# Patient Record
Sex: Male | Born: 1960 | Race: White | Hispanic: No | Smoking: Never smoker
Health system: Southern US, Community
[De-identification: ages and names within clinical notes are randomized; demographics above are authoritative.]

## PROBLEM LIST (undated history)

## (undated) DIAGNOSIS — C181 Malignant neoplasm of appendix: Secondary | ICD-10-CM

## (undated) DIAGNOSIS — Z86010 Personal history of colonic polyps: Secondary | ICD-10-CM

## (undated) DIAGNOSIS — C4490 Unspecified malignant neoplasm of skin, unspecified: Secondary | ICD-10-CM

## (undated) DIAGNOSIS — Z8639 Personal history of other endocrine, nutritional and metabolic disease: Secondary | ICD-10-CM

## (undated) DIAGNOSIS — R7303 Prediabetes: Secondary | ICD-10-CM

## (undated) DIAGNOSIS — C189 Malignant neoplasm of colon, unspecified: Secondary | ICD-10-CM

## (undated) DIAGNOSIS — H903 Sensorineural hearing loss, bilateral: Secondary | ICD-10-CM

## (undated) DIAGNOSIS — K760 Fatty (change of) liver, not elsewhere classified: Secondary | ICD-10-CM

## (undated) DIAGNOSIS — H905 Unspecified sensorineural hearing loss: Secondary | ICD-10-CM

## (undated) DIAGNOSIS — C799 Secondary malignant neoplasm of unspecified site: Secondary | ICD-10-CM

## (undated) DIAGNOSIS — I2510 Atherosclerotic heart disease of native coronary artery without angina pectoris: Secondary | ICD-10-CM

## (undated) DIAGNOSIS — K635 Polyp of colon: Secondary | ICD-10-CM

## (undated) DIAGNOSIS — E785 Hyperlipidemia, unspecified: Secondary | ICD-10-CM

## (undated) DIAGNOSIS — M1990 Unspecified osteoarthritis, unspecified site: Secondary | ICD-10-CM

## (undated) DIAGNOSIS — E291 Testicular hypofunction: Secondary | ICD-10-CM

## (undated) HISTORY — DX: Personal history of other endocrine, nutritional and metabolic disease: Z86.39

## (undated) HISTORY — PX: COLONOSCOPY: SHX174

## (undated) HISTORY — DX: Personal history of colonic polyps: Z86.010

## (undated) HISTORY — DX: Sensorineural hearing loss, bilateral: H90.3

## (undated) HISTORY — PX: LUNG SURGERY: SHX703

## (undated) HISTORY — DX: Unspecified malignant neoplasm of skin, unspecified: C44.90

## (undated) HISTORY — DX: Polyp of colon: K63.5

## (undated) HISTORY — PX: SMALL INTESTINE SURGERY: SHX150

## (undated) HISTORY — DX: Fatty (change of) liver, not elsewhere classified: K76.0

## (undated) HISTORY — DX: Prediabetes: R73.03

## (undated) HISTORY — DX: Atherosclerotic heart disease of native coronary artery without angina pectoris: I25.10

## (undated) HISTORY — DX: Malignant neoplasm of colon, unspecified: C18.9

## (undated) HISTORY — DX: Hyperlipidemia, unspecified: E78.5

## (undated) HISTORY — PX: BRAIN SURGERY: SHX531

## (undated) HISTORY — DX: Testicular hypofunction: E29.1

## (undated) HISTORY — PX: APPENDECTOMY: SHX54

## (undated) HISTORY — DX: Secondary malignant neoplasm of unspecified site: C79.9

## (undated) HISTORY — DX: Unspecified sensorineural hearing loss: H90.5

## (undated) HISTORY — DX: Malignant neoplasm of appendix: C18.1

---

## 1987-07-22 DIAGNOSIS — C439 Malignant melanoma of skin, unspecified: Secondary | ICD-10-CM

## 1987-07-22 DIAGNOSIS — C799 Secondary malignant neoplasm of unspecified site: Secondary | ICD-10-CM | POA: Diagnosis present

## 1987-07-22 HISTORY — DX: Secondary malignant neoplasm of unspecified site: C79.9

## 1987-07-22 HISTORY — DX: Malignant melanoma of skin, unspecified: C43.9

## 1993-07-21 DIAGNOSIS — C189 Malignant neoplasm of colon, unspecified: Secondary | ICD-10-CM

## 1993-07-21 HISTORY — DX: Malignant neoplasm of colon, unspecified: C18.9

## 1996-07-21 HISTORY — PX: SMALL INTESTINE SURGERY: SHX150

## 1997-11-27 ENCOUNTER — Ambulatory Visit (HOSPITAL_COMMUNITY): Admission: RE | Admit: 1997-11-27 | Discharge: 1997-11-27 | Payer: Self-pay | Admitting: Neurosurgery

## 1998-12-03 ENCOUNTER — Encounter: Payer: Self-pay | Admitting: Neurosurgery

## 1998-12-03 ENCOUNTER — Ambulatory Visit (HOSPITAL_COMMUNITY): Admission: RE | Admit: 1998-12-03 | Discharge: 1998-12-03 | Payer: Self-pay | Admitting: Neurosurgery

## 1999-08-23 ENCOUNTER — Encounter: Payer: Self-pay | Admitting: *Deleted

## 1999-08-23 ENCOUNTER — Encounter: Admission: RE | Admit: 1999-08-23 | Discharge: 1999-08-23 | Payer: Self-pay | Admitting: *Deleted

## 1999-11-22 ENCOUNTER — Encounter: Payer: Self-pay | Admitting: *Deleted

## 1999-11-22 ENCOUNTER — Ambulatory Visit (HOSPITAL_COMMUNITY): Admission: RE | Admit: 1999-11-22 | Discharge: 1999-11-22 | Payer: Self-pay | Admitting: *Deleted

## 2000-02-28 ENCOUNTER — Encounter: Payer: Self-pay | Admitting: Oncology

## 2000-02-28 ENCOUNTER — Encounter: Admission: RE | Admit: 2000-02-28 | Discharge: 2000-02-28 | Payer: Self-pay | Admitting: Oncology

## 2000-03-06 ENCOUNTER — Encounter: Payer: Self-pay | Admitting: Oncology

## 2000-03-06 ENCOUNTER — Encounter: Admission: RE | Admit: 2000-03-06 | Discharge: 2000-03-06 | Payer: Self-pay | Admitting: Oncology

## 2000-09-18 ENCOUNTER — Encounter: Admission: RE | Admit: 2000-09-18 | Discharge: 2000-09-18 | Payer: Self-pay | Admitting: Oncology

## 2000-09-18 ENCOUNTER — Encounter: Payer: Self-pay | Admitting: Oncology

## 2001-02-18 ENCOUNTER — Encounter: Payer: Self-pay | Admitting: Oncology

## 2001-02-18 ENCOUNTER — Encounter: Admission: RE | Admit: 2001-02-18 | Discharge: 2001-02-18 | Payer: Self-pay | Admitting: Oncology

## 2001-07-23 ENCOUNTER — Encounter: Admission: RE | Admit: 2001-07-23 | Discharge: 2001-07-23 | Payer: Self-pay | Admitting: *Deleted

## 2001-07-23 ENCOUNTER — Ambulatory Visit (HOSPITAL_COMMUNITY): Admission: RE | Admit: 2001-07-23 | Discharge: 2001-07-23 | Payer: Self-pay | Admitting: *Deleted

## 2001-07-23 ENCOUNTER — Encounter: Payer: Self-pay | Admitting: *Deleted

## 2002-03-25 ENCOUNTER — Encounter: Admission: RE | Admit: 2002-03-25 | Discharge: 2002-03-25 | Payer: Self-pay | Admitting: Oncology

## 2002-03-25 ENCOUNTER — Encounter: Payer: Self-pay | Admitting: Oncology

## 2002-07-29 ENCOUNTER — Ambulatory Visit (HOSPITAL_COMMUNITY): Admission: RE | Admit: 2002-07-29 | Discharge: 2002-07-29 | Payer: Self-pay | Admitting: Oncology

## 2002-07-29 ENCOUNTER — Encounter: Payer: Self-pay | Admitting: Oncology

## 2003-01-27 ENCOUNTER — Encounter: Admission: RE | Admit: 2003-01-27 | Discharge: 2003-01-27 | Payer: Self-pay | Admitting: Oncology

## 2003-01-27 ENCOUNTER — Encounter: Payer: Self-pay | Admitting: Oncology

## 2003-08-04 ENCOUNTER — Ambulatory Visit (HOSPITAL_COMMUNITY): Admission: RE | Admit: 2003-08-04 | Discharge: 2003-08-04 | Payer: Self-pay | Admitting: Oncology

## 2003-09-04 ENCOUNTER — Ambulatory Visit (HOSPITAL_COMMUNITY): Admission: RE | Admit: 2003-09-04 | Discharge: 2003-09-04 | Payer: Self-pay | Admitting: Oncology

## 2004-02-09 ENCOUNTER — Ambulatory Visit (HOSPITAL_COMMUNITY): Admission: RE | Admit: 2004-02-09 | Discharge: 2004-02-09 | Payer: Self-pay | Admitting: Oncology

## 2004-08-23 ENCOUNTER — Encounter: Admission: RE | Admit: 2004-08-23 | Discharge: 2004-08-23 | Payer: Self-pay | Admitting: Oncology

## 2004-08-26 ENCOUNTER — Ambulatory Visit: Payer: Self-pay | Admitting: Oncology

## 2005-08-22 ENCOUNTER — Ambulatory Visit (HOSPITAL_COMMUNITY): Admission: RE | Admit: 2005-08-22 | Discharge: 2005-08-22 | Payer: Self-pay | Admitting: Oncology

## 2005-09-11 ENCOUNTER — Ambulatory Visit: Payer: Self-pay | Admitting: Oncology

## 2006-09-04 ENCOUNTER — Ambulatory Visit (HOSPITAL_COMMUNITY): Admission: RE | Admit: 2006-09-04 | Discharge: 2006-09-04 | Payer: Self-pay | Admitting: Oncology

## 2006-09-07 ENCOUNTER — Ambulatory Visit: Payer: Self-pay | Admitting: Oncology

## 2007-08-31 ENCOUNTER — Ambulatory Visit: Payer: Self-pay | Admitting: Oncology

## 2007-09-02 LAB — CBC WITH DIFFERENTIAL/PLATELET
BASO%: 0.5 % (ref 0.0–2.0)
Basophils Absolute: 0 10*3/uL (ref 0.0–0.1)
EOS%: 0.5 % (ref 0.0–7.0)
Eosinophils Absolute: 0 10*3/uL (ref 0.0–0.5)
HCT: 44.1 % (ref 38.7–49.9)
HGB: 14.9 g/dL (ref 13.0–17.1)
LYMPH%: 21.1 % (ref 14.0–48.0)
MCH: 29 pg (ref 28.0–33.4)
MCHC: 33.7 g/dL (ref 32.0–35.9)
MCV: 86.2 fL (ref 81.6–98.0)
MONO#: 0.5 10*3/uL (ref 0.1–0.9)
MONO%: 7 % (ref 0.0–13.0)
NEUT#: 5.4 10*3/uL (ref 1.5–6.5)
NEUT%: 70.9 % (ref 40.0–75.0)
Platelets: 309 10*3/uL (ref 145–400)
RBC: 5.12 10*6/uL (ref 4.20–5.71)
RDW: 12.5 % (ref 11.2–14.6)
WBC: 7.6 10*3/uL (ref 4.0–10.0)
lymph#: 1.6 10*3/uL (ref 0.9–3.3)

## 2007-09-02 LAB — COMPREHENSIVE METABOLIC PANEL
ALT: 19 U/L (ref 0–53)
AST: 17 U/L (ref 0–37)
Albumin: 4.6 g/dL (ref 3.5–5.2)
Alkaline Phosphatase: 46 U/L (ref 39–117)
BUN: 16 mg/dL (ref 6–23)
CO2: 27 mEq/L (ref 19–32)
Calcium: 9.2 mg/dL (ref 8.4–10.5)
Chloride: 104 mEq/L (ref 96–112)
Creatinine, Ser: 1.01 mg/dL (ref 0.40–1.50)
Glucose, Bld: 100 mg/dL — ABNORMAL HIGH (ref 70–99)
Potassium: 4 mEq/L (ref 3.5–5.3)
Sodium: 139 mEq/L (ref 135–145)
Total Bilirubin: 0.5 mg/dL (ref 0.3–1.2)
Total Protein: 7 g/dL (ref 6.0–8.3)

## 2007-11-11 ENCOUNTER — Encounter: Admission: RE | Admit: 2007-11-11 | Discharge: 2007-11-11 | Payer: Self-pay | Admitting: Gastroenterology

## 2008-09-06 ENCOUNTER — Ambulatory Visit: Payer: Self-pay | Admitting: Oncology

## 2008-09-06 LAB — COMPREHENSIVE METABOLIC PANEL
ALT: 37 U/L (ref 0–53)
AST: 25 U/L (ref 0–37)
Albumin: 4.1 g/dL (ref 3.5–5.2)
Alkaline Phosphatase: 43 U/L (ref 39–117)
BUN: 13 mg/dL (ref 6–23)
CO2: 30 mEq/L (ref 19–32)
Calcium: 9.2 mg/dL (ref 8.4–10.5)
Chloride: 101 mEq/L (ref 96–112)
Creatinine, Ser: 0.85 mg/dL (ref 0.40–1.50)
Glucose, Bld: 104 mg/dL — ABNORMAL HIGH (ref 70–99)
Potassium: 3.5 mEq/L (ref 3.5–5.3)
Sodium: 138 mEq/L (ref 135–145)
Total Bilirubin: 0.5 mg/dL (ref 0.3–1.2)
Total Protein: 7.1 g/dL (ref 6.0–8.3)

## 2008-09-06 LAB — CBC WITH DIFFERENTIAL/PLATELET
BASO%: 0.3 % (ref 0.0–2.0)
Basophils Absolute: 0 10*3/uL (ref 0.0–0.1)
EOS%: 0.8 % (ref 0.0–7.0)
Eosinophils Absolute: 0.1 10*3/uL (ref 0.0–0.5)
HCT: 44.3 % (ref 38.7–49.9)
HGB: 15.3 g/dL (ref 13.0–17.1)
LYMPH%: 21.4 % (ref 14.0–48.0)
MCH: 30.3 pg (ref 28.0–33.4)
MCHC: 34.6 g/dL (ref 32.0–35.9)
MCV: 87.7 fL (ref 81.6–98.0)
MONO#: 0.5 10*3/uL (ref 0.1–0.9)
MONO%: 7 % (ref 0.0–13.0)
NEUT#: 5.1 10*3/uL (ref 1.5–6.5)
NEUT%: 70.5 % (ref 40.0–75.0)
Platelets: 242 10*3/uL (ref 145–400)
RBC: 5.05 10*6/uL (ref 4.20–5.71)
RDW: 12.4 % (ref 11.2–14.6)
WBC: 7.2 10*3/uL (ref 4.0–10.0)
lymph#: 1.5 10*3/uL (ref 0.9–3.3)

## 2008-09-18 ENCOUNTER — Ambulatory Visit (HOSPITAL_COMMUNITY): Admission: RE | Admit: 2008-09-18 | Discharge: 2008-09-18 | Payer: Self-pay | Admitting: Oncology

## 2009-09-03 ENCOUNTER — Ambulatory Visit: Payer: Self-pay | Admitting: Oncology

## 2009-09-06 LAB — CBC WITH DIFFERENTIAL/PLATELET
BASO%: 0.3 % (ref 0.0–2.0)
Basophils Absolute: 0 10*3/uL (ref 0.0–0.1)
EOS%: 1.1 % (ref 0.0–7.0)
Eosinophils Absolute: 0.1 10*3/uL (ref 0.0–0.5)
HCT: 42.2 % (ref 38.4–49.9)
HGB: 14.6 g/dL (ref 13.0–17.1)
LYMPH%: 22 % (ref 14.0–49.0)
MCH: 30.8 pg (ref 27.2–33.4)
MCHC: 34.6 g/dL (ref 32.0–36.0)
MCV: 88.9 fL (ref 79.3–98.0)
MONO#: 0.4 10*3/uL (ref 0.1–0.9)
MONO%: 5.2 % (ref 0.0–14.0)
NEUT#: 5.6 10*3/uL (ref 1.5–6.5)
NEUT%: 71.4 % (ref 39.0–75.0)
Platelets: 217 10*3/uL (ref 140–400)
RBC: 4.74 10*6/uL (ref 4.20–5.82)
RDW: 12.2 % (ref 11.0–14.6)
WBC: 7.9 10*3/uL (ref 4.0–10.3)
lymph#: 1.7 10*3/uL (ref 0.9–3.3)

## 2009-09-06 LAB — COMPREHENSIVE METABOLIC PANEL
ALT: 41 U/L (ref 0–53)
AST: 31 U/L (ref 0–37)
Albumin: 4.2 g/dL (ref 3.5–5.2)
Alkaline Phosphatase: 44 U/L (ref 39–117)
BUN: 14 mg/dL (ref 6–23)
CO2: 29 mEq/L (ref 19–32)
Calcium: 9.3 mg/dL (ref 8.4–10.5)
Chloride: 103 mEq/L (ref 96–112)
Creatinine, Ser: 1.03 mg/dL (ref 0.40–1.50)
Glucose, Bld: 98 mg/dL (ref 70–99)
Potassium: 3.7 mEq/L (ref 3.5–5.3)
Sodium: 140 mEq/L (ref 135–145)
Total Bilirubin: 0.7 mg/dL (ref 0.3–1.2)
Total Protein: 7.3 g/dL (ref 6.0–8.3)

## 2009-09-06 LAB — PSA: PSA: 0.61 ng/mL (ref 0.10–4.00)

## 2010-09-02 ENCOUNTER — Other Ambulatory Visit: Payer: Self-pay | Admitting: Oncology

## 2010-09-02 ENCOUNTER — Encounter (HOSPITAL_BASED_OUTPATIENT_CLINIC_OR_DEPARTMENT_OTHER): Payer: BC Managed Care – PPO | Admitting: Oncology

## 2010-09-02 DIAGNOSIS — C439 Malignant melanoma of skin, unspecified: Secondary | ICD-10-CM

## 2010-09-02 LAB — PSA: PSA: 0.74 ng/mL (ref <=4.00)

## 2010-09-02 LAB — CBC WITH DIFFERENTIAL/PLATELET
BASO%: 0.8 % (ref 0.0–2.0)
Basophils Absolute: 0.1 10*3/uL (ref 0.0–0.1)
EOS%: 0.9 % (ref 0.0–7.0)
Eosinophils Absolute: 0.1 10*3/uL (ref 0.0–0.5)
HCT: 42.9 % (ref 38.4–49.9)
HGB: 14.7 g/dL (ref 13.0–17.1)
LYMPH%: 30.8 % (ref 14.0–49.0)
MCH: 30 pg (ref 27.2–33.4)
MCHC: 34.2 g/dL (ref 32.0–36.0)
MCV: 87.6 fL (ref 79.3–98.0)
MONO#: 0.4 10*3/uL (ref 0.1–0.9)
MONO%: 6.2 % (ref 0.0–14.0)
NEUT#: 3.8 10*3/uL (ref 1.5–6.5)
NEUT%: 61.3 % (ref 39.0–75.0)
Platelets: 226 10*3/uL (ref 140–400)
RBC: 4.9 10*6/uL (ref 4.20–5.82)
RDW: 12.8 % (ref 11.0–14.6)
WBC: 6.2 10*3/uL (ref 4.0–10.3)
lymph#: 1.9 10*3/uL (ref 0.9–3.3)

## 2010-09-02 LAB — COMPREHENSIVE METABOLIC PANEL
ALT: 23 U/L (ref 0–53)
AST: 20 U/L (ref 0–37)
Albumin: 3.9 g/dL (ref 3.5–5.2)
Alkaline Phosphatase: 44 U/L (ref 39–117)
BUN: 11 mg/dL (ref 6–23)
CO2: 29 mEq/L (ref 19–32)
Calcium: 9.2 mg/dL (ref 8.4–10.5)
Chloride: 105 mEq/L (ref 96–112)
Creatinine, Ser: 0.96 mg/dL (ref 0.40–1.50)
Glucose, Bld: 118 mg/dL — ABNORMAL HIGH (ref 70–99)
Potassium: 3.5 mEq/L (ref 3.5–5.3)
Sodium: 141 mEq/L (ref 135–145)
Total Bilirubin: 0.4 mg/dL (ref 0.3–1.2)
Total Protein: 6.9 g/dL (ref 6.0–8.3)

## 2010-09-09 ENCOUNTER — Encounter (HOSPITAL_BASED_OUTPATIENT_CLINIC_OR_DEPARTMENT_OTHER): Payer: BC Managed Care – PPO | Admitting: Oncology

## 2010-09-09 DIAGNOSIS — C4360 Malignant melanoma of unspecified upper limb, including shoulder: Secondary | ICD-10-CM

## 2011-06-02 DIAGNOSIS — E785 Hyperlipidemia, unspecified: Secondary | ICD-10-CM | POA: Insufficient documentation

## 2011-09-03 ENCOUNTER — Other Ambulatory Visit: Payer: BC Managed Care – PPO | Admitting: Lab

## 2011-09-08 ENCOUNTER — Other Ambulatory Visit (HOSPITAL_BASED_OUTPATIENT_CLINIC_OR_DEPARTMENT_OTHER): Payer: BC Managed Care – PPO

## 2011-09-08 DIAGNOSIS — C7800 Secondary malignant neoplasm of unspecified lung: Secondary | ICD-10-CM

## 2011-09-08 DIAGNOSIS — C792 Secondary malignant neoplasm of skin: Secondary | ICD-10-CM

## 2011-09-08 DIAGNOSIS — C4360 Malignant melanoma of unspecified upper limb, including shoulder: Secondary | ICD-10-CM

## 2011-09-08 DIAGNOSIS — C801 Malignant (primary) neoplasm, unspecified: Secondary | ICD-10-CM

## 2011-09-08 LAB — COMPREHENSIVE METABOLIC PANEL
ALT: 33 U/L (ref 0–53)
AST: 21 U/L (ref 0–37)
Albumin: 3.7 g/dL (ref 3.5–5.2)
Alkaline Phosphatase: 51 U/L (ref 39–117)
BUN: 15 mg/dL (ref 6–23)
CO2: 27 mEq/L (ref 19–32)
Calcium: 9.2 mg/dL (ref 8.4–10.5)
Chloride: 104 mEq/L (ref 96–112)
Creatinine, Ser: 0.91 mg/dL (ref 0.50–1.35)
Glucose, Bld: 101 mg/dL — ABNORMAL HIGH (ref 70–99)
Potassium: 3.5 mEq/L (ref 3.5–5.3)
Sodium: 141 mEq/L (ref 135–145)
Total Bilirubin: 0.3 mg/dL (ref 0.3–1.2)
Total Protein: 7.4 g/dL (ref 6.0–8.3)

## 2011-09-08 LAB — CBC WITH DIFFERENTIAL/PLATELET
BASO%: 0.3 % (ref 0.0–2.0)
Basophils Absolute: 0 10*3/uL (ref 0.0–0.1)
EOS%: 1 % (ref 0.0–7.0)
Eosinophils Absolute: 0.1 10*3/uL (ref 0.0–0.5)
HCT: 42 % (ref 38.4–49.9)
HGB: 14.4 g/dL (ref 13.0–17.1)
LYMPH%: 24.8 % (ref 14.0–49.0)
MCH: 29.8 pg (ref 27.2–33.4)
MCHC: 34.4 g/dL (ref 32.0–36.0)
MCV: 86.7 fL (ref 79.3–98.0)
MONO#: 0.5 10*3/uL (ref 0.1–0.9)
MONO%: 6.6 % (ref 0.0–14.0)
NEUT#: 4.7 10*3/uL (ref 1.5–6.5)
NEUT%: 67.3 % (ref 39.0–75.0)
Platelets: 217 10*3/uL (ref 140–400)
RBC: 4.84 10*6/uL (ref 4.20–5.82)
RDW: 12.7 % (ref 11.0–14.6)
WBC: 7 10*3/uL (ref 4.0–10.3)
lymph#: 1.7 10*3/uL (ref 0.9–3.3)

## 2011-09-08 LAB — PSA: PSA: 0.67 ng/mL (ref <=4.00)

## 2011-09-10 ENCOUNTER — Ambulatory Visit (HOSPITAL_BASED_OUTPATIENT_CLINIC_OR_DEPARTMENT_OTHER): Payer: BC Managed Care – PPO | Admitting: Oncology

## 2011-09-10 VITALS — BP 138/85 | HR 61 | Temp 98.3°F | Ht 68.0 in | Wt 212.9 lb

## 2011-09-10 DIAGNOSIS — C439 Malignant melanoma of skin, unspecified: Secondary | ICD-10-CM | POA: Insufficient documentation

## 2011-09-10 NOTE — Progress Notes (Signed)
ID: Sean [name]   DOB: 24-May-1961  MR#: [id number]  CSN#:[id number]   INTERVAL HISTORY: Sean [name] returns today with his 50 year old son Sean [name] (sixth grade, likes science) for followup of Sean [name]'s remote metastatic melanoma. 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 Unfortunately this means a lot of riding but not much walking. He is no longer hunting.  REVIEW OF SYSTEMS: He denies unusual headaches, visual changes, nausea, vomiting, taste alteration, cough, phlegm production, pleurisy, shortness of breath, or any change in bladder habits. There has been no bleeding, unexplained fatigue or unexplained weight loss. He has not noted any skin changes and he continues to see Dr. [name] on a regular basis. He has had a few loose bowel movements lately, he thinks possibly because of some diet changes. Overall however a detailed review of systems today was noncontributory  HEALTH MAINTENANCE: History  Substance Use Topics  . Smoking status: Not on file  . Smokeless tobacco: Not on file  . Alcohol Use: Not on file     Colonoscopy: 2010  Lipid panel: "finally chol. <200" (on red yeast rice)  Allergies not on file  No current outpatient prescriptions on file.    OBJECTIVE: Middle-aged white male who appears comfortable Filed Vitals:   09/10/11 1700  BP: 138/85  Pulse: 61  Temp: 98.3 F (36.8 C)     Body mass index is 32.37 kg/(m^2).    ECOG FS: 0  Sclerae unicteric Oropharynx clear No peripheral adenopathy Lungs no rales or rhonchi Heart regular rate and rhythm Abd benign MSK no focal spinal tenderness, no peripheral edema Neuro: nonfocal Skin: He has scattered acne but no pigmented or suspicious lesions. Only the upper body was examined today  LAB RESULTS:  PSA is <1  Lab Results  Component Value Date   WBC 7.0 09/08/2011   NEUTROABS 4.7 09/08/2011   HGB 14.4 09/08/2011   HCT 42.0 09/08/2011   MCV 86.7 09/08/2011   PLT 217 09/08/2011     Chemistry      Component Value Date/Time   NA 141 09/08/2011 1624   K 3.5 09/08/2011 1624   CL 104 09/08/2011 1624   CO2 27 09/08/2011 1624   BUN 15 09/08/2011 1624   CREATININE 0.91 09/08/2011 1624      Component Value Date/Time   CALCIUM 9.2 09/08/2011 1624   ALKPHOS 51 09/08/2011 1624   AST 21 09/08/2011 1624   ALT 33 09/08/2011 1624   BILITOT 0.3 09/08/2011 1624       STUDIES: No new results found.  ASSESSMENT: 51 year old [place] man with a history of malignant melanoma dating to November 1989 when he had a Clark's level III, Breslow depth 1.0 left forearm melanoma removed.  It recurred to the left upper arm in February 1993, surgically removed.  He then had further recurrences to the left back, left lower lobe of the lung (removed in 1995), solitary brain lesion removed in September 1996, and at that point he received the [facility] regimen chemotherapy with a right lower lobe lung lesion as measurable disease. The chemotherapy was ineffective and theright lung lesion was removed in November 1996.  In February 1997 he had a small bowel lesion removed followed by a mesenteric lesion removed in 1998.  PLAN: There have been no further recurrences. His exam and labs today are unremarkable. He was supposed to have had a chest x-ray prior to today's visit but he will arrange for that within the next 2 weeks.  He will see me again in one year with a repeat chest x-ray before that visit as well as lab work. I encouraged him to start a walking program and if it's difficult to do it during the day to consider getting a treadmill for the home gym. He knows to call for any problems that may develop before the next visit here.   Sean [name] C    09/10/2011

## 2011-09-11 ENCOUNTER — Telehealth: Payer: Self-pay | Admitting: Oncology

## 2011-09-11 NOTE — Telephone Encounter (Signed)
S/w the pt regarding his 2014 appts

## 2012-09-06 ENCOUNTER — Other Ambulatory Visit: Payer: BC Managed Care – PPO

## 2012-09-07 ENCOUNTER — Other Ambulatory Visit: Payer: BC Managed Care – PPO

## 2012-09-14 ENCOUNTER — Ambulatory Visit: Payer: BC Managed Care – PPO | Admitting: Oncology

## 2012-09-16 ENCOUNTER — Ambulatory Visit: Payer: BC Managed Care – PPO | Admitting: Oncology

## 2012-10-06 ENCOUNTER — Other Ambulatory Visit: Payer: Self-pay | Admitting: Oncology

## 2012-10-06 ENCOUNTER — Other Ambulatory Visit (HOSPITAL_BASED_OUTPATIENT_CLINIC_OR_DEPARTMENT_OTHER): Payer: BC Managed Care – PPO

## 2012-10-06 DIAGNOSIS — C439 Malignant melanoma of skin, unspecified: Secondary | ICD-10-CM

## 2012-10-06 LAB — COMPREHENSIVE METABOLIC PANEL (CC13)
ALT: 28 U/L (ref 0–55)
AST: 17 U/L (ref 5–34)
Albumin: 3.6 g/dL (ref 3.5–5.0)
Alkaline Phosphatase: 51 U/L (ref 40–150)
BUN: 16.3 mg/dL (ref 7.0–26.0)
CO2: 28 mEq/L (ref 22–29)
Calcium: 9.1 mg/dL (ref 8.4–10.4)
Chloride: 104 mEq/L (ref 98–107)
Creatinine: 0.8 mg/dL (ref 0.7–1.3)
Glucose: 117 mg/dl — ABNORMAL HIGH (ref 70–99)
Potassium: 3.5 mEq/L (ref 3.5–5.1)
Sodium: 141 mEq/L (ref 136–145)
Total Bilirubin: 0.45 mg/dL (ref 0.20–1.20)
Total Protein: 7 g/dL (ref 6.4–8.3)

## 2012-10-06 LAB — CBC WITH DIFFERENTIAL/PLATELET
BASO%: 0.7 % (ref 0.0–2.0)
Basophils Absolute: 0.1 10*3/uL (ref 0.0–0.1)
EOS%: 1.2 % (ref 0.0–7.0)
Eosinophils Absolute: 0.1 10*3/uL (ref 0.0–0.5)
HCT: 42.3 % (ref 38.4–49.9)
HGB: 14.3 g/dL (ref 13.0–17.1)
LYMPH%: 26.6 % (ref 14.0–49.0)
MCH: 28.8 pg (ref 27.2–33.4)
MCHC: 33.8 g/dL (ref 32.0–36.0)
MCV: 85.3 fL (ref 79.3–98.0)
MONO#: 0.4 10*3/uL (ref 0.1–0.9)
MONO%: 5.6 % (ref 0.0–14.0)
NEUT#: 5.1 10*3/uL (ref 1.5–6.5)
NEUT%: 65.9 % (ref 39.0–75.0)
Platelets: 238 10*3/uL (ref 140–400)
RBC: 4.96 10*6/uL (ref 4.20–5.82)
RDW: 12.9 % (ref 11.0–14.6)
WBC: 7.7 10*3/uL (ref 4.0–10.3)
lymph#: 2.1 10*3/uL (ref 0.9–3.3)

## 2012-10-06 LAB — PSA: PSA: 0.72 ng/mL (ref <=4.00)

## 2012-10-18 ENCOUNTER — Ambulatory Visit (HOSPITAL_BASED_OUTPATIENT_CLINIC_OR_DEPARTMENT_OTHER): Payer: BC Managed Care – PPO | Admitting: Oncology

## 2012-10-18 ENCOUNTER — Telehealth: Payer: Self-pay | Admitting: *Deleted

## 2012-10-18 VITALS — BP 119/70 | HR 50 | Temp 97.8°F | Resp 20 | Ht 68.0 in | Wt 209.7 lb

## 2012-10-18 DIAGNOSIS — M25569 Pain in unspecified knee: Secondary | ICD-10-CM

## 2012-10-18 DIAGNOSIS — C439 Malignant melanoma of skin, unspecified: Secondary | ICD-10-CM

## 2012-10-18 NOTE — Progress Notes (Signed)
ID: Sean [name]   DOB: 05/19/1961  MR#: [id number]  CSN#:[id number]  PCP: Sean Lek, MD OTHER MD: Sean [name]  INTERVAL HISTORY: Sean [name] returns today with his sons Sean [name] and Sean [name] for followup of Sean [name]'s  metastatic melanoma. He continues to work for [employer] [name]; and has about 9 more years before you can retire, he tells me. His primary care doctor retired and he is now seeing Sean [name]  REVIEW OF SYSTEMS: His biggest problem is the right knee. He went saw Sean [name], had an MRI, and basically was told to get a new job. Of course Sean [name] can to do that and he is hoping for a second opinion. He's had minimal ear drainage, no sinus symptoms, had a separate current doses shaved off his left sideburn, but has had no unusual headaches visual changes, nausea, vomiting, or balance problems. He denies cough, shortness of breath, pleurisy, or change in bowel or bladder habits. A detailed review of systems today was otherwise negative.  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 His son Sean [name] works for [employer] and is in [employer] reserves, eventually to be in an Barrister's clerk. Son Sean [name] is in the seventh grade.  HEALTH MAINTENANCE: History  Substance Use Topics  . Smoking status: Not on file  . Smokeless tobacco: Not on file  . Alcohol Use: Not on file     Colonoscopy: 2010  Lipid panel: on red yeast rice  Allergies not on file  No current outpatient prescriptions on file.   No current facility-administered medications for this visit.    OBJECTIVE: Middle-aged white male in no acute distress Filed Vitals:   10/18/12 1608  BP: 119/70  Pulse: 50  Temp: 97.8 F (36.6 [name])  Resp: 20     Body mass index is 31.89 kg/(m^2).    ECOG FS: 0  Sclerae unicteric Oropharynx clear No peripheral adenopathy Lungs no rales or rhonchi Heart regular rate and rhythm Abd soft, nontender, positive bowel sounds MSK no focal spinal tenderness, no  peripheral edema Neuro: nonfocal, well oriented, pleasant affect Skin: He has no pigmented or otherwise suspicious lesions. Only the upper body was examined today  LAB RESULTS:    Results for Sean [name] (MRN [id number]) as of 10/18/2012 16:12  Ref. Range 09/06/2009 15:48 09/02/2010 16:28 09/08/2011 16:24 10/06/2012 16:08  PSA Latest Range: <=4.00 ng/mL 0.61 0.74 0.67 0.72    Lab Results  Component Value Date   WBC 7.7 10/06/2012   NEUTROABS 5.1 10/06/2012   HGB 14.3 10/06/2012   HCT 42.3 10/06/2012   MCV 85.3 10/06/2012   PLT 238 10/06/2012      Chemistry      Component Value Date/Time   NA 141 10/06/2012 1546   NA 141 09/08/2011 1624   K 3.5 10/06/2012 1546   K 3.5 09/08/2011 1624   CL 104 10/06/2012 1546   CL 104 09/08/2011 1624   CO2 28 10/06/2012 1546   CO2 27 09/08/2011 1624   BUN 16.3 10/06/2012 1546   BUN 15 09/08/2011 1624   CREATININE 0.8 10/06/2012 1546   CREATININE 0.91 09/08/2011 1624      Component Value Date/Time   CALCIUM 9.1 10/06/2012 1546   CALCIUM 9.2 09/08/2011 1624   ALKPHOS 51 10/06/2012 1546   ALKPHOS 51 09/08/2011 1624   AST 17 10/06/2012 1546   AST 21 09/08/2011 1624   ALT 28 10/06/2012 1546   ALT 33 09/08/2011 1624   BILITOT  0.45 10/06/2012 1546   BILITOT 0.3 09/08/2011 1624       STUDIES: No results found.   ASSESSMENT: 52 y.o. [place] man with a history of malignant melanoma dating to November 1989 when he had a Clark's level III, Breslow depth 1.0 left forearm melanoma removed.  It recurred to the left upper arm in February 1993, surgically removed.  He then had further recurrences to the left back, left lower lobe of the lung (removed in 1995), solitary brain lesion removed in September 1996, and at that point he received the [facility] regimen chemotherapy with a right lower lobe lung lesion as measurable disease. The chemotherapy was ineffective and the right lung lesion was removed in November 1996.  In February 1997 he had a small bowel lesion removed  followed by a mesenteric lesion removed in 1998.There have been no further recurrences.  PLAN: Sean [name] continues to do remarkably well. He sees whole Sean [name] regularly, and that is likely is most important exam. He gets his cholesterol checked through his primary physician, and we check his PSA once a year. It is very gratifying that he has no symptoms of disease recurrence though I worry as he gets older and his immune system weekends recurrence might be an issue. He knows to call for any problems that may develop before his next visit here, which will be in one year. In the meantime, he would like a second opinion regarding his right knee and I am asking Sean [name] to see him.Marland Kitchen   Sean [name]    10/18/2012

## 2012-10-18 NOTE — Telephone Encounter (Signed)
appts made and printed 

## 2013-10-13 ENCOUNTER — Other Ambulatory Visit (HOSPITAL_BASED_OUTPATIENT_CLINIC_OR_DEPARTMENT_OTHER): Payer: BC Managed Care – PPO

## 2013-10-13 DIAGNOSIS — Z85820 Personal history of malignant melanoma of skin: Secondary | ICD-10-CM

## 2013-10-13 DIAGNOSIS — C439 Malignant melanoma of skin, unspecified: Secondary | ICD-10-CM

## 2013-10-13 LAB — COMPREHENSIVE METABOLIC PANEL (CC13)
ALT: 25 U/L (ref 0–55)
AST: 17 U/L (ref 5–34)
Albumin: 4 g/dL (ref 3.5–5.0)
Alkaline Phosphatase: 54 U/L (ref 40–150)
Anion Gap: 10 mEq/L (ref 3–11)
BUN: 13.8 mg/dL (ref 7.0–26.0)
CO2: 25 mEq/L (ref 22–29)
Calcium: 9.4 mg/dL (ref 8.4–10.4)
Chloride: 105 mEq/L (ref 98–109)
Creatinine: 0.9 mg/dL (ref 0.7–1.3)
Glucose: 94 mg/dl (ref 70–140)
Potassium: 4.1 mEq/L (ref 3.5–5.1)
Sodium: 140 mEq/L (ref 136–145)
Total Bilirubin: 0.48 mg/dL (ref 0.20–1.20)
Total Protein: 7.4 g/dL (ref 6.4–8.3)

## 2013-10-13 LAB — CBC WITH DIFFERENTIAL/PLATELET
BASO%: 0.5 % (ref 0.0–2.0)
Basophils Absolute: 0 10*3/uL (ref 0.0–0.1)
EOS%: 1.6 % (ref 0.0–7.0)
Eosinophils Absolute: 0.1 10*3/uL (ref 0.0–0.5)
HCT: 44.1 % (ref 38.4–49.9)
HGB: 14.8 g/dL (ref 13.0–17.1)
LYMPH%: 29.4 % (ref 14.0–49.0)
MCH: 28.8 pg (ref 27.2–33.4)
MCHC: 33.5 g/dL (ref 32.0–36.0)
MCV: 86 fL (ref 79.3–98.0)
MONO#: 0.5 10*3/uL (ref 0.1–0.9)
MONO%: 7 % (ref 0.0–14.0)
NEUT#: 4.7 10*3/uL (ref 1.5–6.5)
NEUT%: 61.5 % (ref 39.0–75.0)
Platelets: 237 10*3/uL (ref 140–400)
RBC: 5.12 10*6/uL (ref 4.20–5.82)
RDW: 12.8 % (ref 11.0–14.6)
WBC: 7.7 10*3/uL (ref 4.0–10.3)
lymph#: 2.3 10*3/uL (ref 0.9–3.3)

## 2013-10-14 LAB — PSA: PSA: 0.7 ng/mL (ref <=4.00)

## 2013-10-20 ENCOUNTER — Ambulatory Visit (HOSPITAL_BASED_OUTPATIENT_CLINIC_OR_DEPARTMENT_OTHER): Payer: BC Managed Care – PPO | Admitting: Oncology

## 2013-10-20 ENCOUNTER — Telehealth: Payer: Self-pay | Admitting: Oncology

## 2013-10-20 ENCOUNTER — Ambulatory Visit (HOSPITAL_COMMUNITY)
Admission: RE | Admit: 2013-10-20 | Discharge: 2013-10-20 | Disposition: A | Discharge status: Home / Self Care | Payer: BC Managed Care – PPO | Source: Ambulatory Visit | Attending: Oncology | Admitting: Oncology

## 2013-10-20 VITALS — BP 136/85 | HR 69 | Temp 98.7°F | Resp 18 | Ht 68.0 in | Wt 205.9 lb

## 2013-10-20 DIAGNOSIS — C439 Malignant melanoma of skin, unspecified: Secondary | ICD-10-CM

## 2013-10-20 DIAGNOSIS — Z85820 Personal history of malignant melanoma of skin: Secondary | ICD-10-CM | POA: Insufficient documentation

## 2013-10-20 NOTE — Telephone Encounter (Signed)
, °

## 2013-10-20 NOTE — Progress Notes (Signed)
ID: Sean [name] [name]   DOB: [birth year]  MR#: [id number]  CSN#:[id number]  PCP: Sean [name] Shire, MD OTHER MD: Sean [name] [name]  INTERVAL HISTORY: Sean [name] [name] returns today for followup of his history of metastatic malignant melanoma. The interval history is generally unremarkable. 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 Son Sean [name] [name], 22, and is an Electrical engineer working for [name] [name]. Sean [name] [name]. 13 and very good a baseball.   REVIEW OF SYSTEMS: Sean [name] [name] is not exercising regularly. He does have a stationary bike at home. Of course his job is physical. His diet is questionable. Occasionally has ringing in his ears, but no unusual headaches, dizziness, visual changes, nausea, or vomiting. Has been no cough, phlegm production, or pleurisy. He denies chest pain or pressure. Have not been any change in bowel or bladder habits. 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 His son Sean [name] [name] works for [employer] and is in [employer] reserves, eventually to be in an Conservation officer, nature. Son Sean [name] [name] is in the seventh grade.  HEALTH MAINTENANCE: History  Substance Use Topics  . Smoking status: Not on file  . Smokeless tobacco: Not on file  . Alcohol Use: Not on file     Colonoscopy: 2010  Lipid panel: on red yeast rice  Allergies not on file  No current outpatient prescriptions on file.   No current facility-administered medications for this visit.    OBJECTIVE: 53-year-old white male who appears well Filed Vitals:   10/20/13 1557  BP: 136/85  Pulse: 69  Temp: 98.7 F (37.1 [name])  Resp: 18     Body mass index is 31.31 kg/(m^2).    ECOG FS: 0  Sclerae unicteric, pupils equal round and reactive Oropharynx clear, teeth in good repair No cervical, supraclavicular, or axillary adenopathy Lungs no rales or rhonchi Heart regular rate and rhythm Abd soft, nontender,  positive bowel sounds, no masses palpated MSK no focal spinal tenderness, no peripheral edema Neuro: nonfocal, well oriented, pleasant affect Skin: Multiple scars as noted before but no pigmented lesions. He does have a lesion in the middle anterior chest which measures approximately 3 mm, is flat, and while not pigmented probably should be removed  LAB RESULTS:     Lab Results  Component Value Date   WBC 7.7 10/13/2013   NEUTROABS 4.7 10/13/2013   HGB 14.8 10/13/2013   HCT 44.1 10/13/2013   MCV 86.0 10/13/2013   PLT 237 10/13/2013   Results for Sean [name] [name], Sean [name] [name] (MRN [id number]) as of 10/20/2013 16:07  Ref. Range 09/06/2009 15:48 09/02/2010 16:28 09/08/2011 16:24 10/06/2012 16:08 10/13/2013 15:48  PSA Latest Range: <=4.00 ng/mL 0.61 0.74 0.67 0.72 0.70     Chemistry      Component Value Date/Time   NA 140 10/13/2013 1548   NA 141 09/08/2011 1624   K 4.1 10/13/2013 1548   K 3.5 09/08/2011 1624   CL 104 10/06/2012 1546   CL 104 09/08/2011 1624   CO2 25 10/13/2013 1548   CO2 27 09/08/2011 1624   BUN 13.8 10/13/2013 1548   BUN 15 09/08/2011 1624   CREATININE 0.9 10/13/2013 1548   CREATININE 0.91 09/08/2011 1624      Component Value Date/Time   CALCIUM 9.4 10/13/2013 1548   CALCIUM 9.2 09/08/2011 1624   ALKPHOS 54 10/13/2013 1548   ALKPHOS 51 09/08/2011 1624   AST  17 10/13/2013 1548   AST 21 09/08/2011 1624   ALT 25 10/13/2013 1548   ALT 33 09/08/2011 1624   BILITOT 0.48 10/13/2013 1548   BILITOT 0.3 09/08/2011 1624       STUDIES: Chest x-ray today pending    ASSESSMENT: 53 y.o. [place] man with a history of malignant melanoma dating to November 1989 when he had a Clark's level III, Breslow depth 1.0 left forearm melanoma removed.  It recurred to the left upper arm in February 1993, surgically removed.  He then had further recurrences to the left back, left lower lobe of the lung (removed in 1995), solitary brain lesion removed in September 1996, and at that point he received the [facility]  regimen chemotherapy with a right lower lobe lung lesion as measurable disease. The chemotherapy was ineffective and the right lung lesion was removed in November 1996.  In February 1997 he had a small bowel lesion removed followed by a mesenteric lesion removed in 1998.There have been no further recurrences.  PLAN: Sean [name] [name] continues to show no evidence of disease recurrence which is very favorable and nothing short of miraculous. I have encouraged him to improve his diet and to exercise more. He may be coming up for repeat colonoscopy this year or next and I will make sure Dr. Wynetta [name] gets a copy of today's dictation.  The only slightly ointment as he has not kept up with his dermatologist for over a year. I am referring him back to Dr. Tonia [name] especially as he has a new spot, nonpigmented, on his anterior chest, and she will probably want to remove.  Sean [name] [name] knows to call for any problems that may develop before his next visit here.  Sean [name] [name]    10/20/2013

## 2013-10-25 ENCOUNTER — Telehealth: Payer: Self-pay | Admitting: *Deleted

## 2013-10-25 NOTE — Telephone Encounter (Signed)
Called patient per Dr Jana Hakim, CXR on 10/20/13 was clear, no evidence of metastasis.

## 2014-01-10 ENCOUNTER — Telehealth: Payer: Self-pay | Admitting: *Deleted

## 2014-01-10 NOTE — Telephone Encounter (Signed)
This RN received call from pt's primary MD office- [facility]- stating concern due to pt came in to their office with complaints of diarrhea x 1 week.  Concern per Lattie Haw " is Sean [name] said this was the symptom he had when he was first diagnosed with cancer "  Per Lattie Haw she states Sean [name] developed diarrhea last Tuesday " but then didn't treat it and continue to eat foods that could have excerbated the diarrhea "  " this weekend he developed return of the diarrhea and had chill and a fever "  " today he does not have a fever but is continuing to have diarrhea - we obtained lab studies and will get a C Diff and stool culture "  " I asked Sean [name] to call your office just to let you know of situation in case further follow up is needed "  This RN thanked Lattie Haw for her call .  Sean [name] then called this RN and discussed the above as well. Sean [name] states " I thought it might be some bad cheese I got at [employer] - I made a blue cheese dip "  Sean [name] states diarrhea was on and off and worse after eating.  He states he is taking stool samples into the office tomorrow.  This RN discussed above with pt including need to get scan if necessary. Sean [name] states " I would prefer to wait a while if ok.  Sean [name] will call this RN on Thursday 6/24 with update.

## 2014-01-16 ENCOUNTER — Ambulatory Visit (INDEPENDENT_AMBULATORY_CARE_PROVIDER_SITE_OTHER): Payer: BC Managed Care – PPO | Admitting: Family Medicine

## 2014-01-16 ENCOUNTER — Encounter: Payer: Self-pay | Admitting: Family Medicine

## 2014-01-16 VITALS — BP 113/74 | HR 60 | Temp 97.7°F | Resp 16 | Ht 68.0 in | Wt 197.0 lb

## 2014-01-16 DIAGNOSIS — Z125 Encounter for screening for malignant neoplasm of prostate: Secondary | ICD-10-CM

## 2014-01-16 DIAGNOSIS — Z0000 Encounter for general adult medical examination without abnormal findings: Secondary | ICD-10-CM

## 2014-01-16 LAB — CBC WITH DIFFERENTIAL/PLATELET
Basophils Absolute: 0.1 10*3/uL (ref 0.0–0.1)
Basophils Relative: 1.1 % (ref 0.0–3.0)
Eosinophils Absolute: 0.1 10*3/uL (ref 0.0–0.7)
Eosinophils Relative: 1.3 % (ref 0.0–5.0)
HCT: 43.4 % (ref 39.0–52.0)
Hemoglobin: 14.5 g/dL (ref 13.0–17.0)
Lymphocytes Relative: 24.8 % (ref 12.0–46.0)
Lymphs Abs: 1.7 10*3/uL (ref 0.7–4.0)
MCHC: 33.4 g/dL (ref 30.0–36.0)
MCV: 87.7 fl (ref 78.0–100.0)
Monocytes Absolute: 0.4 10*3/uL (ref 0.1–1.0)
Monocytes Relative: 5.8 % (ref 3.0–12.0)
Neutro Abs: 4.6 10*3/uL (ref 1.4–7.7)
Neutrophils Relative %: 67 % (ref 43.0–77.0)
Platelets: 281 10*3/uL (ref 150.0–400.0)
RBC: 4.95 Mil/uL (ref 4.22–5.81)
RDW: 12.7 % (ref 11.5–15.5)
WBC: 6.9 10*3/uL (ref 4.0–10.5)

## 2014-01-16 LAB — COMPREHENSIVE METABOLIC PANEL
ALT: 49 U/L (ref 0–53)
AST: 26 U/L (ref 0–37)
Albumin: 4 g/dL (ref 3.5–5.2)
Alkaline Phosphatase: 42 U/L (ref 39–117)
BUN: 16 mg/dL (ref 6–23)
CO2: 28 mEq/L (ref 19–32)
Calcium: 8.7 mg/dL (ref 8.4–10.5)
Chloride: 103 mEq/L (ref 96–112)
Creatinine, Ser: 0.9 mg/dL (ref 0.4–1.5)
GFR: 99.11 mL/min (ref >60.00)
Glucose, Bld: 94 mg/dL (ref 70–99)
Potassium: 3.8 mEq/L (ref 3.5–5.1)
Sodium: 138 mEq/L (ref 135–145)
Total Bilirubin: 0.5 mg/dL (ref 0.2–1.2)
Total Protein: 7.1 g/dL (ref 6.0–8.3)

## 2014-01-16 LAB — LIPID PANEL
Cholesterol: 169 mg/dL (ref 0–200)
HDL: 39.1 mg/dL (ref >39.00)
LDL Cholesterol: 117 mg/dL — ABNORMAL HIGH (ref 0–99)
NonHDL: 129.9
Total CHOL/HDL Ratio: 4
Triglycerides: 66 mg/dL (ref 0.0–149.0)
VLDL: 13.2 mg/dL (ref 0.0–40.0)

## 2014-01-16 LAB — TSH: TSH: 2.84 u[IU]/mL (ref 0.35–4.50)

## 2014-01-16 LAB — PSA: PSA: 0.87 ng/mL (ref 0.10–4.00)

## 2014-01-16 NOTE — Progress Notes (Signed)
Pre visit review using our clinic review tool, if applicable. No additional management support is needed unless otherwise documented below in the visit note. 

## 2014-01-16 NOTE — Assessment & Plan Note (Signed)
Reviewed age and gender appropriate health maintenance issues (prudent diet, regular exercise, health risks of tobacco and excessive alcohol, use of seatbelts, fire alarms in home, use of sunscreen).  Also reviewed age and gender appropriate health screening as well as vaccine recommendations. HP labs + PSA today. DRE normal. Colonoscopy next due about 2020.

## 2014-01-16 NOTE — Progress Notes (Signed)
Office Note 01/16/2014  CC:  Chief Complaint  Patient presents with  . Establish Care    transfer from [place]    HPI:  Sean [name] is a 53 y.o. White male who is here to establish care. Patient's most recent primary MD: [facility].  Oncologist: Dr. Jana Hakim.  Derm: Crista Luria, MD. Old records were not reviewed prior to or during today's visit.  Feeling well.  Wants CPE today--fasting.  Past Medical History  Diagnosis Date  . Metastatic malignant melanoma 1989    No sign of recurrence since 1998  . Hyperlipidemia     Mild, never meds    Past Surgical History  Procedure Laterality Date  . Brain surgery    . Lung surgery      Removal of melanoma met  . Small intestine surgery      "               "         "        "    Family History  Problem Relation Age of Onset  . Cancer Mother   . Alcohol abuse Father     History   Social History  . Marital Status: Married    Spouse Name: N/A    Number of Children: N/A  . Years of Education: N/A   Occupational History  . Not on file.   Social History Main Topics  . Smoking status: Never Smoker   . Smokeless tobacco: Former Systems developer  . Alcohol Use: No  . Drug Use: No  . Sexual Activity: Not on file   Other Topics Concern  . Not on file   Social History Narrative   Married, 2 sons.   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   No T/A/Ds.   Exercises fairly regularly.   Diet ok except fast foods.    Outpatient Encounter Prescriptions as of 01/16/2014  Medication Sig  . Omega-3 Fatty Acids (FISH OIL) 1000 MG CAPS Take by mouth.    Allergies  Allergen Reactions  . Penicillins     childhood    ROS Review of Systems  Constitutional: Negative for fever, chills, appetite change and fatigue.  HENT: Negative for congestion, dental problem, ear pain and sore throat.   Eyes: Negative for discharge, redness and visual disturbance.  Respiratory: Negative for cough, chest tightness, shortness of breath  and wheezing.   Cardiovascular: Negative for chest pain, palpitations and leg swelling.  Gastrointestinal: Negative for nausea, vomiting, abdominal pain, diarrhea and blood in stool.  Genitourinary: Negative for dysuria, urgency, frequency, hematuria, flank pain and difficulty urinating.  Musculoskeletal: Negative for arthralgias, back pain, joint swelling, myalgias and neck stiffness.  Skin: Negative for pallor and rash.  Neurological: Negative for dizziness, speech difficulty, weakness and headaches.  Hematological: Negative for adenopathy. Does not bruise/bleed easily.  Psychiatric/Behavioral: Negative for confusion and sleep disturbance. The patient is not nervous/anxious.     PE; Blood pressure 113/74, pulse 60, temperature 97.7 F (36.5 C), temperature source Temporal, resp. rate 16, height _0  (1.727 m), weight 197 lb (89.359 kg), SpO2 97.00%. Gen: Alert, well appearing.  Patient is oriented to person, place, time, and situation. AFFECT: pleasant, lucid thought and speech. ENT: Ears: EACs clear, normal epithelium.  TMs with good light reflex and landmarks bilaterally.  Eyes: no injection, icteris, swelling, or exudate.  EOMI, PERRLA. Nose: no drainage or turbinate edema/swelling.  No injection or focal lesion.  Mouth: lips  without lesion/swelling.  Oral mucosa pink and moist.  Dentition intact and without obvious caries or gingival swelling.  Oropharynx without erythema, exudate, or swelling.  Neck: supple/nontender.  No LAD, mass, or TM.  Carotid pulses 2+ bilaterally, without bruits. CV: RRR, no m/r/g.   LUNGS: CTA bilat, nonlabored resps, good aeration in all lung fields. ABD: soft, NT, ND, BS normal.  No hepatospenomegaly or mass.  No bruits. EXT: no clubbing, cyanosis, or edema.  Musculoskeletal: no joint swelling, erythema, warmth, or tenderness.  ROM of all joints intact. Skin - no sores or suspicious lesions or rashes or color changes Rectal exam: negative without mass,  lesions or tenderness, PROSTATE EXAM: smooth and symmetric without nodules or tenderness.  Pertinent labs:  None today  ASSESSMENT AND PLAN:   New pt: obtain old records  Health maintenance examination Reviewed age and gender appropriate health maintenance issues (prudent diet, regular exercise, health risks of tobacco and excessive alcohol, use of seatbelts, fire alarms in home, use of sunscreen).  Also reviewed age and gender appropriate health screening as well as vaccine recommendations. HP labs + PSA today. DRE normal. Colonoscopy next due about 2020.  An After Visit Summary was printed and given to the patient.  Return in about 1 year (around 01/17/2015) for annual CPE (fasting).

## 2014-01-23 ENCOUNTER — Encounter: Payer: Self-pay | Admitting: Family Medicine

## 2014-02-02 ENCOUNTER — Encounter: Payer: Self-pay | Admitting: Family Medicine

## 2014-02-02 DIAGNOSIS — Z125 Encounter for screening for malignant neoplasm of prostate: Secondary | ICD-10-CM | POA: Insufficient documentation

## 2014-07-17 ENCOUNTER — Encounter: Payer: BC Managed Care – PPO | Admitting: Family Medicine

## 2014-07-18 ENCOUNTER — Encounter: Payer: Self-pay | Admitting: Family Medicine

## 2014-07-18 ENCOUNTER — Ambulatory Visit: Payer: BC Managed Care – PPO | Admitting: Family Medicine

## 2014-07-18 NOTE — Progress Notes (Signed)
Office Note 07/18/2014  CC:  Chief Complaint  Patient presents with   Annual Exam    fasting    HPI:  Sean [name] is a 53 y.o.  male who is    Past Medical History  Diagnosis Date   Metastatic malignant melanoma 1989    No sign of recurrence since 1998   Hyperlipidemia     Mild, never meds    Past Surgical History  Procedure Laterality Date   Brain surgery      Removal of melanoma met   Lung surgery      "              "          "        "   Small intestine surgery      "               "         "        "   Colonoscopy  2009/10    repeat 5 yrs.  iFOB neg 07/22/2012    Family History  Problem Relation Age of Onset   Cancer Mother    Alcohol abuse Father     History   Social History   Marital Status: Married    Spouse Name: N/A    Number of Children: N/A   Years of Education: N/A   Occupational History   Not on file.   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   No T/A/Ds.   Exercises fairly regularly.   Diet ok except fast foods.    Outpatient Prescriptions Prior to Visit  Medication Sig Dispense Refill   Omega-3 Fatty Acids (FISH OIL) 1000 MG CAPS Take by mouth.     No facility-administered medications prior to visit.    Allergies  Allergen Reactions   Aspirin Nausea And Vomiting   Penicillins     childhood    ROS  PE; Blood pressure 130/86, pulse 54, temperature 98 F (36.7 C), temperature source Temporal, resp. rate 18, height 5' 8"  (1.727 m), weight 206 lb (93.441 kg), SpO2 98 %.  Pertinent labs:  Lab Results  Component Value Date   TSH 2.84 01/16/2014   Lab Results  Component Value Date   WBC 6.9 01/16/2014   HGB 14.5 01/16/2014   HCT 43.4 01/16/2014   MCV 87.7  01/16/2014   PLT 281.0 01/16/2014   Lab Results  Component Value Date   CREATININE 0.9 01/16/2014   BUN 16 01/16/2014   NA 138 01/16/2014   K 3.8 01/16/2014   CL 103 01/16/2014   CO2 28 01/16/2014   Lab Results  Component Value Date   ALT 49 01/16/2014   AST 26 01/16/2014   ALKPHOS 42 01/16/2014   BILITOT 0.5 01/16/2014   Lab Results  Component Value Date   CHOL 169 01/16/2014   Lab Results  Component Value Date   HDL 39.10 01/16/2014   Lab Results  Component Value Date   LDLCALC 117* 01/16/2014   Lab Results  Component Value Date   TRIG 66.0 01/16/2014   Lab  Results  Component Value Date   CHOLHDL 4 01/16/2014   Lab Results  Component Value Date   PSA 0.87 01/16/2014   PSA 0.70 10/13/2013   PSA 0.72 10/06/2012    ASSESSMENT AND PLAN:   No problem-specific assessment & plan notes found for this encounter.   FOLLOW UP:  No Follow-up on file.

## 2014-07-18 NOTE — Progress Notes (Signed)
Pre visit review using our clinic review tool, if applicable. No additional management support is needed unless otherwise documented below in the visit note. 

## 2014-09-19 ENCOUNTER — Encounter: Payer: Self-pay | Admitting: Nurse Practitioner

## 2014-09-19 ENCOUNTER — Encounter: Payer: Self-pay | Admitting: *Deleted

## 2014-09-19 ENCOUNTER — Ambulatory Visit (INDEPENDENT_AMBULATORY_CARE_PROVIDER_SITE_OTHER): Payer: BLUE CROSS/BLUE SHIELD | Admitting: Nurse Practitioner

## 2014-09-19 VITALS — BP 124/80 | HR 65 | Temp 99.1°F | Ht 68.0 in | Wt 208.0 lb

## 2014-09-19 DIAGNOSIS — J111 Influenza due to unidentified influenza virus with other respiratory manifestations: Secondary | ICD-10-CM

## 2014-09-19 MED ORDER — OSELTAMIVIR PHOSPHATE 75 MG PO CAPS: 75.0000 mg | ORAL_CAPSULE | Freq: Two times a day (BID) | ORAL | Status: DC

## 2014-09-19 MED ORDER — HYDROCODONE-HOMATROPINE 5-1.5 MG/5ML PO SYRP: 5.0000 mL | ORAL_SOLUTION | Freq: Every evening | ORAL | Status: DC | PRN

## 2014-09-19 NOTE — Progress Notes (Signed)
Pre visit review using our clinic review tool, if applicable. No additional management support is needed unless otherwise documented below in the visit note. 

## 2014-09-19 NOTE — Progress Notes (Signed)
   Subjective:    Patient ID: Sean [name], male    DOB: 1960/09/29, 54 y.o.   MRN: [id number]  Cough This is a new problem. The current episode started in the past 7 days (2 d). The problem has been unchanged. The problem occurs hourly. The cough is non-productive. Associated symptoms include a fever, headaches, myalgias and nasal congestion. Pertinent negatives include no chest pain, chills, ear congestion, ear pain, sore throat, shortness of breath or wheezing. Nothing aggravates the symptoms. He has tried nothing for the symptoms.      Review of Systems  Constitutional: Positive for fever. Negative for chills and fatigue.  HENT: Positive for congestion. Negative for ear pain and sore throat.   Respiratory: Positive for cough. Negative for shortness of breath and wheezing.   Cardiovascular: Negative for chest pain.  Musculoskeletal: Positive for myalgias.  Neurological: Positive for headaches.       Objective:   Physical Exam  Constitutional: He is oriented to person, place, and time. He appears well-developed and well-nourished. No distress.  HENT:  Head: Normocephalic and atraumatic.  Right Ear: External ear normal.  Left Ear: External ear normal.  Mouth/Throat: Oropharynx is clear and moist. No oropharyngeal exudate.  Nasal quality to voice  Eyes: Conjunctivae are normal. Right eye exhibits no discharge. Left eye exhibits no discharge.  Neck: No thyromegaly present.  Cardiovascular: Normal rate, regular rhythm and normal heart sounds.   No murmur heard. Pulmonary/Chest: Effort normal and breath sounds normal. No respiratory distress. He has no wheezes. He has no rales.  Lymphadenopathy:    He has no cervical adenopathy.  Neurological: He is alert and oriented to person, place, and time.  Skin: Skin is warm and dry.  Psychiatric: He has a normal mood and affect. His behavior is normal.  Vitals reviewed.         Assessment & Plan:  1. Influenza - oseltamivir  (TAMIFLU) 75 MG capsule; Take 1 capsule (75 mg total) by mouth 2 (two) times daily.  Dispense: 10 capsule; Refill: 0 - HYDROcodone-homatropine (HYCODAN) 5-1.5 MG/5ML syrup; Take 5 mLs by mouth at bedtime as needed for cough.  Dispense: 120 mL; Refill: 0 See pt instructions for symptom management F/u PRN

## 2014-09-19 NOTE — Patient Instructions (Signed)
You likely have flu. The average duration is 5-10 days. Start tamiflu. Treatment symptoms as follows: For sinus congestion, start daily sinus rinses (neilmed Sinus Rinse) & 30 mg to 60 mg pseudoephedrine twice daily.  For sore throat use benzocaine throat lozenges or spray.  For aches & fever alternate tylenol & ibuprophen every 4-6 hours.  For cough, you may use a spoonful of honey thinned with lemon juice or hot tea. Use cough syrup at night.  Sip fluids every hour. Rest. If you are not feeling better in 2 weeks or develop fever or chest pain, call us for re-evaluation. Feel better!    Influenza A (H1N1) H1N1 formerly called "swine flu" is a new influenza virus causing sickness in people. The H1N1 virus is different from seasonal influenza viruses. However, the H1N1 symptoms are similar to seasonal influenza and it is spread from person to person. You may be at higher risk for serious problems if you have underlying serious medical conditions. The CDC and the [facility] are following reported cases around the world. CAUSES   The flu is thought to spread mainly person-to-person through coughing or sneezing of infected people.  A person may become infected by touching something with the virus on it and then touching their mouth or nose. SYMPTOMS   Fever.  Headache.  Tiredness.  Cough.  Sore throat.  Runny or stuffy nose.  Body aches.  Diarrhea and vomiting These symptoms are referred to as "flu-like symptoms." A lot of different illnesses, including the common cold, may have similar symptoms. DIAGNOSIS   There are tests that can tell if you have the H1N1 virus.  Confirmed cases of H1N1 will be reported to the state or local health department.  A doctor's exam may be needed to tell whether you have an infection that is a complication of the flu. HOME CARE INSTRUCTIONS   Stay informed. Visit the [facility] website for current recommendations. Visit  [facility]. You may also call 1-800-CDC-INFO 980 010 6694).  Get help early if you develop any of the above symptoms.  If you are at high risk from complications of the flu, talk to your caregiver as soon as you develop flu-like symptoms. Those at higher risk for complications include:  People 65 years or older.  People with chronic medical conditions.  Pregnant women.  Young children.  Your caregiver may recommend antiviral medicine to help treat the flu.  If you get the flu, get plenty of rest, drink enough water and fluids to keep your urine clear or pale yellow, and avoid using alcohol or tobacco.  You may take over-the-counter medicine to relieve the symptoms of the flu if your caregiver approves. (Never give aspirin to children or teenagers who have flu-like symptoms, particularly fever). TREATMENT  If you do get sick, antiviral drugs are available. These drugs can make your illness milder and make you feel better faster. Treatment should start soon after illness starts. It is only effective if taken within the first day of becoming ill. Only your caregiver can prescribe antiviral medication.  PREVENTION   Cover your nose and mouth with a tissue or your arm when you cough or sneeze. Throw the tissue away.  Wash your hands often with soap and warm water, especially after you cough or sneeze. Alcohol-based cleaners are also effective against germs.  Avoid touching your eyes, nose or mouth. This is one way germs spread.  Try to avoid contact with sick people. Follow public health advice regarding school closures. Avoid  crowds.  Stay home if you get sick. Limit contact with others to keep from infecting them. People infected with the H1N1 virus may be able to infect others anywhere from 1 day before feeling sick to 5-7 days after getting flu symptoms.  An H1N1 vaccine is available to help protect against the virus. In addition to the H1N1 vaccine, you will need to be  vaccinated for seasonal influenza. The H1N1 and seasonal vaccines may be given on the same day. The CDC especially recommends the H1N1 vaccine for:  Pregnant women.  People who live with or care for children younger than 61 months of age.  Health care and emergency services personnel.  Persons between the ages of 30 months through 36 years of age.  People from ages 35 through 48 years who are at higher risk for H1N1 because of chronic health disorders or immune system problems. FACEMASKS In community and home settings, the use of facemasks and N95 respirators are not normally recommended. In certain circumstances, a facemask or N95 respirator may be used for persons at increased risk of severe illness from influenza. Your caregiver can give additional recommendations for facemask use. IN CHILDREN, EMERGENCY WARNING SIGNS THAT NEED URGENT MEDICAL CARE:  Fast breathing or trouble breathing.  Bluish skin color.  Not drinking enough fluids.  Not waking up or not interacting normally.  Being so fussy that the child does not want to be held.  Your child has an oral temperature above 102 F (38.9 C), not controlled by medicine.  Your baby is older than 3 months with a rectal temperature of 102 F (38.9 C) or higher.  Your baby is 53 months old or younger with a rectal temperature of 100.4 F (38 C) or higher.  Flu-like symptoms improve but then return with fever and worse cough. IN ADULTS, EMERGENCY WARNING SIGNS THAT NEED URGENT MEDICAL CARE:  Difficulty breathing or shortness of breath.  Pain or pressure in the chest or abdomen.  Sudden dizziness.  Confusion.  Severe or persistent vomiting.  Bluish color.  You have a oral temperature above 102 F (38.9 C), not controlled by medicine.  Flu-like symptoms improve but return with fever and worse cough. SEEK IMMEDIATE MEDICAL CARE IF:  You or someone you know is experiencing any of the above symptoms. When you arrive at the  emergency center, report that you think you have the flu. You may be asked to wear a mask and/or sit in a secluded area to protect others from getting sick. MAKE SURE YOU:   Understand these instructions.  Will watch your condition.  Will get help right away if you are not doing well or get worse. Some of this information courtesy of the CDC.  Document Released: 12/24/2007 Document Revised: 09/29/2011 Document Reviewed: 12/24/2007 [facility] Patient Information 2014 [place], [place].

## 2014-10-18 ENCOUNTER — Other Ambulatory Visit: Payer: Self-pay | Admitting: *Deleted

## 2014-10-18 DIAGNOSIS — Z125 Encounter for screening for malignant neoplasm of prostate: Secondary | ICD-10-CM

## 2014-10-18 DIAGNOSIS — C439 Malignant melanoma of skin, unspecified: Secondary | ICD-10-CM

## 2014-10-19 ENCOUNTER — Other Ambulatory Visit (HOSPITAL_BASED_OUTPATIENT_CLINIC_OR_DEPARTMENT_OTHER): Payer: BLUE CROSS/BLUE SHIELD

## 2014-10-19 ENCOUNTER — Other Ambulatory Visit: Payer: Self-pay | Admitting: *Deleted

## 2014-10-19 DIAGNOSIS — C439 Malignant melanoma of skin, unspecified: Secondary | ICD-10-CM

## 2014-10-19 DIAGNOSIS — Z125 Encounter for screening for malignant neoplasm of prostate: Secondary | ICD-10-CM

## 2014-10-19 DIAGNOSIS — Z85820 Personal history of malignant melanoma of skin: Secondary | ICD-10-CM | POA: Diagnosis not present

## 2014-10-19 DIAGNOSIS — Z0000 Encounter for general adult medical examination without abnormal findings: Secondary | ICD-10-CM

## 2014-10-19 LAB — COMPREHENSIVE METABOLIC PANEL (CC13)
ALT: 34 U/L (ref 0–55)
AST: 25 U/L (ref 5–34)
Albumin: 3.8 g/dL (ref 3.5–5.0)
Alkaline Phosphatase: 48 U/L (ref 40–150)
Anion Gap: 11 mEq/L (ref 3–11)
BUN: 9.8 mg/dL (ref 7.0–26.0)
CO2: 24 mEq/L (ref 22–29)
Calcium: 8.7 mg/dL (ref 8.4–10.4)
Chloride: 105 mEq/L (ref 98–109)
Creatinine: 0.9 mg/dL (ref 0.7–1.3)
EGFR: >90 mL/min/{1.73_m2} (ref >90)
Glucose: 101 mg/dl (ref 70–140)
Potassium: 4.3 mEq/L (ref 3.5–5.1)
Sodium: 141 mEq/L (ref 136–145)
Total Bilirubin: 0.62 mg/dL (ref 0.20–1.20)
Total Protein: 7.1 g/dL (ref 6.4–8.3)

## 2014-10-19 LAB — CBC WITH DIFFERENTIAL/PLATELET
BASO%: 0.4 % (ref 0.0–2.0)
Basophils Absolute: 0 10*3/uL (ref 0.0–0.1)
EOS%: 1.4 % (ref 0.0–7.0)
Eosinophils Absolute: 0.1 10*3/uL (ref 0.0–0.5)
HCT: 44.5 % (ref 38.4–49.9)
HGB: 15.2 g/dL (ref 13.0–17.1)
LYMPH%: 31.4 % (ref 14.0–49.0)
MCH: 29.5 pg (ref 27.2–33.4)
MCHC: 34.2 g/dL (ref 32.0–36.0)
MCV: 86.4 fL (ref 79.3–98.0)
MONO#: 0.3 10*3/uL (ref 0.1–0.9)
MONO%: 6.2 % (ref 0.0–14.0)
NEUT#: 3.1 10*3/uL (ref 1.5–6.5)
NEUT%: 60.6 % (ref 39.0–75.0)
Platelets: 199 10*3/uL (ref 140–400)
RBC: 5.15 10*6/uL (ref 4.20–5.82)
RDW: 12.6 % (ref 11.0–14.6)
WBC: 5.2 10*3/uL (ref 4.0–10.3)
lymph#: 1.6 10*3/uL (ref 0.9–3.3)

## 2014-10-20 LAB — LIPID PANEL
Cholesterol: 223 mg/dL — ABNORMAL HIGH (ref 0–200)
HDL: 39 mg/dL — ABNORMAL LOW (ref >=40)
LDL Cholesterol: 159 mg/dL — ABNORMAL HIGH (ref 0–99)
Total CHOL/HDL Ratio: 5.7 Ratio
Triglycerides: 126 mg/dL (ref <150)
VLDL: 25 mg/dL (ref 0–40)

## 2014-10-20 LAB — PSA: PSA: 0.74 ng/mL (ref <=4.00)

## 2014-10-26 ENCOUNTER — Ambulatory Visit (HOSPITAL_COMMUNITY)
Admission: RE | Admit: 2014-10-26 | Discharge: 2014-10-26 | Disposition: A | Discharge status: Home / Self Care | Payer: BLUE CROSS/BLUE SHIELD | Source: Ambulatory Visit | Attending: Oncology | Admitting: Oncology

## 2014-10-26 ENCOUNTER — Telehealth: Payer: Self-pay | Admitting: Oncology

## 2014-10-26 ENCOUNTER — Ambulatory Visit (HOSPITAL_BASED_OUTPATIENT_CLINIC_OR_DEPARTMENT_OTHER): Payer: BLUE CROSS/BLUE SHIELD | Admitting: Oncology

## 2014-10-26 VITALS — BP 138/80 | HR 65 | Temp 98.4°F | Resp 18 | Ht 68.0 in | Wt 208.4 lb

## 2014-10-26 DIAGNOSIS — Z85820 Personal history of malignant melanoma of skin: Secondary | ICD-10-CM | POA: Diagnosis not present

## 2014-10-26 DIAGNOSIS — C439 Malignant melanoma of skin, unspecified: Secondary | ICD-10-CM

## 2014-10-26 DIAGNOSIS — Z85828 Personal history of other malignant neoplasm of skin: Secondary | ICD-10-CM | POA: Insufficient documentation

## 2014-10-26 NOTE — Progress Notes (Signed)
ID: [name]   DOB: 07-15-1961  MR#: [id number]  CSN#:[id number]  PCP: Sean Sou, MD OTHER MD: Sean [name]  INTERVAL HISTORY: Sean [name] returns today for followup of his history of metastatic malignant melanoma. The interval history is generally stable. He continues to work for [employer]. Son Sean [name] is working as an Press photographer and son Sean [name], in the ninth grade, is already first pitcher for the high school team.  REVIEW OF SYSTEMS: Sean [name] denies unusual headaches, visual changes, dizziness, gait imbalance, nausea, or vomiting. There have been no cough, phlegm production, or pleurisy. That been no change in bowel or bladder habits. He has not noted any skin lesions that have changed. A detailed review of systems today was otherwise stable.  SOCIAL HISTORY: Sean [name] works for [employer], mostly [place] route, with a lot of driving. His son Sean [name] works for [employer] and is in [employer] reserves, eventually to be in an Conservation officer, nature. Son Sean [name] is in the seventh grade.  HEALTH MAINTENANCE: History  Substance Use Topics  . Smoking status: Never Smoker   . Smokeless tobacco: Former Systems developer  . Alcohol Use: No     Colonoscopy: 2010  Lipid panel: on red yeast rice  Allergies  Allergen Reactions  . Aspirin Nausea And Vomiting  . Penicillins     childhood    Current Outpatient Prescriptions  Medication Sig Dispense Refill  . HYDROcodone-homatropine (HYCODAN) 5-1.5 MG/5ML syrup Take 5 mLs by mouth at bedtime as needed for cough. 120 mL 0  . Omega-3 Fatty Acids (FISH OIL) 1000 MG CAPS Take by mouth.    . oseltamivir (TAMIFLU) 75 MG capsule Take 1 capsule (75 mg total) by mouth 2 (two) times daily. 10 capsule 0   No current facility-administered medications for this visit.    OBJECTIVE: Middle-aged white man n no acute distress  Filed Vitals:   10/26/14 1616  BP: 138/80  Pulse: 65  Temp: 98.4 F (36.9 C)  Resp: 18     Body mass index is 31.69 kg/(m^2).    ECOG FS:  0  Sclerae unicteric, pupils equal and reactive Oropharynx clear and moist-- no thrush or other lesions No cervical or supraclavicular adenopathy Lungs no rales or rhonchi Heart regular rate and rhythm Abd soft, nontender, positive bowel sounds MSK no focal spinal tenderness, no upper extremity lymphedema Neuro: nonfocal, well oriented, appropriate affect Skin: I do not see any suspicious lesions over the head or torso   LAB RESULTS:   Results for Sean [name] (MRN [id number]) as of 10/26/2014 16:32  Ref. Range 09/08/2011 16:24 10/06/2012 16:08 10/13/2013 15:48 01/16/2014 09:25 10/19/2014 10:13  PSA Latest Range: <=4.00 ng/mL 0.67 0.72 0.70 0.87 0.74  Results for Sean [name] (MRN [id number]) as of 10/26/2014 16:32  Ref. Range 10/19/2014 10:13  Cholesterol Latest Range: 0-200 mg/dL 223 (H)  Triglycerides Latest Range: <150 mg/dL 126  HDL Latest Range: >=40 mg/dL 39 (L)  LDL (calc) Latest Range: 0-99 mg/dL 159 (H)  VLDL Latest Range: 0-40 mg/dL 25  Total CHOL/HDL Ratio Latest Units: Ratio 5.7    Lab Results  Component Value Date   WBC 5.2 10/19/2014   NEUTROABS 3.1 10/19/2014   HGB 15.2 10/19/2014   HCT 44.5 10/19/2014   MCV 86.4 10/19/2014   PLT 199 10/19/2014      Chemistry      Component Value Date/Time   NA 141 10/19/2014 0959   NA 138 01/16/2014 0925   K 4.3 10/19/2014 0959  K 3.8 01/16/2014 0925   CL 103 01/16/2014 0925   CL 104 10/06/2012 1546   CO2 24 10/19/2014 0959   CO2 28 01/16/2014 0925   BUN 9.8 10/19/2014 0959   BUN 16 01/16/2014 0925   CREATININE 0.9 10/19/2014 0959   CREATININE 0.9 01/16/2014 0925      Component Value Date/Time   CALCIUM 8.7 10/19/2014 0959   CALCIUM 8.7 01/16/2014 0925   ALKPHOS 48 10/19/2014 0959   ALKPHOS 42 01/16/2014 0925   AST 25 10/19/2014 0959   AST 26 01/16/2014 0925   ALT 34 10/19/2014 0959   ALT 49 01/16/2014 0925   BILITOT 0.62 10/19/2014 0959   BILITOT 0.5 01/16/2014 0925       STUDIES: Chest x-ray  today pending  ASSESSMENT: 54 y.o. [place] man with a history of malignant melanoma dating to November 1989 when he had a Clark's level III, Breslow depth 1.0 left forearm melanoma removed.  It recurred to the left upper arm in February 1993, surgically removed.  He then had further recurrences to the left back, left lower lobe of the lung (removed in 1995), solitary brain lesion removed in September 1996, and at that point he received the [facility] regimen chemotherapy with a right lower lobe lung lesion as measurable disease. The chemotherapy was ineffective and the right lung lesion was removed in November 1996.  In February 1997 he had a small bowel lesion removed followed by a mesenteric lesion removed in 1998.There have been no further recurrences.  PLAN:  Sean [name] is now 21 years out from his initial diagnosis of metastatic melanoma. He continues with no evidence of active disease. Most likely the disease is still they are somewhere, controlled by his immune system. This means it is at risk for recurrence if he becomes immunocompromised for example by treatment with steroids, or simply through the aging process.  He will see his dermatologist next week. He may be ready for repeat colonoscopy and I have sent a letter to Sean [name] asking him to see if that is the case. He will have a chest x-ray today. Otherwise Sean [name] will see me again in one year. He knows to call for any problems that may develop before then.  Sean [name] C    10/26/2014

## 2014-10-26 NOTE — Telephone Encounter (Signed)
per pof to sch pt appt-gave pt copy of sch-pt went to [facility] for XRAY

## 2015-01-08 ENCOUNTER — Encounter: Payer: Self-pay | Admitting: Nurse Practitioner

## 2015-01-08 ENCOUNTER — Ambulatory Visit (INDEPENDENT_AMBULATORY_CARE_PROVIDER_SITE_OTHER): Payer: BLUE CROSS/BLUE SHIELD | Admitting: Nurse Practitioner

## 2015-01-08 VITALS — BP 122/75 | HR 54 | Temp 98.0°F | Resp 18 | Ht 68.5 in | Wt 201.0 lb

## 2015-01-08 DIAGNOSIS — Z0000 Encounter for general adult medical examination without abnormal findings: Secondary | ICD-10-CM | POA: Diagnosis not present

## 2015-01-08 DIAGNOSIS — Z114 Encounter for screening for human immunodeficiency virus [HIV]: Secondary | ICD-10-CM | POA: Diagnosis not present

## 2015-01-08 LAB — COMPREHENSIVE METABOLIC PANEL
ALT: 25 U/L (ref 0–53)
AST: 17 U/L (ref 0–37)
Albumin: 4.3 g/dL (ref 3.5–5.2)
Alkaline Phosphatase: 47 U/L (ref 39–117)
BUN: 11 mg/dL (ref 6–23)
CO2: 29 mEq/L (ref 19–32)
Calcium: 9.1 mg/dL (ref 8.4–10.5)
Chloride: 103 mEq/L (ref 96–112)
Creatinine, Ser: 0.85 mg/dL (ref 0.40–1.50)
GFR: 100.08 mL/min (ref >60.00)
Glucose, Bld: 95 mg/dL (ref 70–99)
Potassium: 4.5 mEq/L (ref 3.5–5.1)
Sodium: 137 mEq/L (ref 135–145)
Total Bilirubin: 0.4 mg/dL (ref 0.2–1.2)
Total Protein: 6.9 g/dL (ref 6.0–8.3)

## 2015-01-08 LAB — CBC WITH DIFFERENTIAL/PLATELET
Basophils Absolute: 0 10*3/uL (ref 0.0–0.1)
Basophils Relative: 0.3 % (ref 0.0–3.0)
Eosinophils Absolute: 0.2 10*3/uL (ref 0.0–0.7)
Eosinophils Relative: 3.3 % (ref 0.0–5.0)
HCT: 45.9 % (ref 39.0–52.0)
Hemoglobin: 15.7 g/dL (ref 13.0–17.0)
Lymphocytes Relative: 31.9 % (ref 12.0–46.0)
Lymphs Abs: 2 10*3/uL (ref 0.7–4.0)
MCHC: 34.3 g/dL (ref 30.0–36.0)
MCV: 86.2 fl (ref 78.0–100.0)
Monocytes Absolute: 0.4 10*3/uL (ref 0.1–1.0)
Monocytes Relative: 6.7 % (ref 3.0–12.0)
Neutro Abs: 3.7 10*3/uL (ref 1.4–7.7)
Neutrophils Relative %: 57.8 % (ref 43.0–77.0)
Platelets: 209 10*3/uL (ref 150.0–400.0)
RBC: 5.32 Mil/uL (ref 4.22–5.81)
RDW: 13.4 % (ref 11.5–15.5)
WBC: 6.4 10*3/uL (ref 4.0–10.5)

## 2015-01-08 LAB — PSA: PSA: 0.49 ng/mL (ref 0.10–4.00)

## 2015-01-08 LAB — LIPID PANEL
Cholesterol: 222 mg/dL — ABNORMAL HIGH (ref 0–200)
HDL: 44 mg/dL (ref >39.00)
LDL Cholesterol: 149 mg/dL — ABNORMAL HIGH (ref 0–99)
NonHDL: 178
Total CHOL/HDL Ratio: 5
Triglycerides: 144 mg/dL (ref 0.0–149.0)
VLDL: 28.8 mg/dL (ref 0.0–40.0)

## 2015-01-08 LAB — HEMOGLOBIN A1C: Hgb A1c MFr Bld: 5.9 % (ref 4.6–6.5)

## 2015-01-08 LAB — TSH: TSH: 3.49 u[IU]/mL (ref 0.35–4.50)

## 2015-01-08 LAB — VITAMIN D 25 HYDROXY (VIT D DEFICIENCY, FRACTURES): VITD: 20.2 ng/mL — ABNORMAL LOW (ref 30.00–100.00)

## 2015-01-08 NOTE — Progress Notes (Signed)
Subjective:     Sean [name] is a 54 y.o. male and is here for a comprehensive physical exam. The patient reports no problems. Cannot find time to exercise: lifestyle is fairly sedentary. Discussed healthy diet. He has personal Hx of melanoma w/mets to lung, brain, & intestine. He did not have radiation. He follows w/Dr Jana Hakim for survellance. He has been in remission since 1998.  History   Social History  . Marital Status: Married    Spouse Name: N/A  . Number of Children: 2  . Years of Education: N/A   Occupational History  . Not on file.   Social History Main Topics  . Smoking status: Never Smoker   . Smokeless tobacco: Former Systems developer  . Alcohol Use: No  . Drug Use: No  . Sexual Activity: Yes   Other Topics Concern  . Not on file   Social History Narrative   Married, 2 sons.   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   No T/A/Ds.   Diet ok except fast foods.   Health Maintenance  Topic Date Due  . INFLUENZA VACCINE  02/19/2015  . COLONOSCOPY  11/21/2018  . TETANUS/TDAP  11/20/2019  . HIV Screening  Completed    The following portions of the patient's history were reviewed and updated as appropriate: allergies, current medications, past family history, past medical history, past social history, past surgical history and problem list.  Review of Systems Constitutional: negative for fatigue and fevers Eyes: positive for contacts/glasses and last eye exam over 1 yr ago Respiratory: negative for cough Cardiovascular: positive for occasional swelling of feet, negative for chest pressure/discomfort and irregular heart beat Gastrointestinal: negative for constipation, diarrhea and dyspepsia Integument/breast: negative for rash, skin lesion(s) and sees derm yearly Musculoskeletal:negative for arthralgias, myalgias and c/o L shoulder pain after weed eating 1 mo ago, pain has resolved.  Neurological: negative for headaches Behavioral/Psych: negative for  excessive alcohol consumption, sleep disturbance and tobacco use   Objective:    BP 122/75 mmHg  Pulse 54  Temp(Src) 98 F (36.7 C) (Temporal)  Resp 18  Ht 5' 8.5" (1.74 m)  Wt 201 lb (91.173 kg)  BMI 30.11 kg/m2  SpO2 96% General appearance: alert, cooperative, appears stated age and no distress Eyes: negative findings: lids and lashes normal, conjunctivae and sclerae normal, corneas clear and pupils equal, round, reactive to light and accomodation Ears: normal TM's and external ear canals both ears Throat: lips, mucosa, and tongue normal; teeth and gums normal Neck: no adenopathy, no carotid bruit, supple, symmetrical, trachea midline and thyroid not enlarged, symmetric, no tenderness/mass/nodules Lungs: clear to auscultation bilaterally Heart: regular rate and rhythm, S1, S2 normal, no murmur, click, rub or gallop Abdomen: soft, non-tender; bowel sounds normal; no masses,  no organomegaly Extremities: extremities normal, atraumatic, no cyanosis or edema Pulses: 2+ and symmetric Lymph nodes: Cervical adenopathy: none and Supraclavicular adenopathy: none Neurologic: Grossly normal    Assessment:Plan   1. Preventative health care Encourage 30 mins exercise daily, decrease animal fat & proteins, increase fruits, vegatables proteins & fats - CBC with Differential/Platelet - Comprehensive metabolic panel - HIV antibody - Hemoglobin A1c - TSH - Vit D  25 hydroxy (rtn osteoporosis monitoring) - Lipid panel - PSA  2. Screening for HIV (human immunodeficiency virus)  F/u 1 yr-CPE

## 2015-01-08 NOTE — Patient Instructions (Signed)
My office will call with lab results.  Develop lifelong habits of exercise most days of the week: take a 30 minute walk. The benefits include weight loss, lower risk for heart disease, diabetes, stroke, high blood pressure, lower rates of depression & dementia, better sleep quality & bone health.   Preventive Care for Adults A healthy lifestyle and preventive care can promote health and wellness. Preventive health guidelines for men include the following key practices:  A routine yearly physical is a good way to check with your health care provider about your health and preventative screening. It is a chance to share any concerns and updates on your health and to receive a thorough exam.  Visit your dentist for a routine exam and preventative care every 6 months. Brush your teeth twice a day and floss once a day. Good oral hygiene prevents tooth decay and gum disease.  The frequency of eye exams is based on your age, health, family medical history, use of contact lenses, and other factors. Follow your health care provider's recommendations for frequency of eye exams.  Eat a healthy diet. Foods such as vegetables, fruits, whole grains, low-fat dairy products, and lean protein foods contain the nutrients you need without too many calories. Decrease your intake of foods high in solid fats, added sugars, and salt. Eat the right amount of calories for you.Get information about a proper diet from your health care provider, if necessary.  Regular physical exercise is one of the most important things you can do for your health. Most adults should get at least 150 minutes of moderate-intensity exercise (any activity that increases your heart rate and causes you to sweat) each week. In addition, most adults need muscle-strengthening exercises on 2 or more days a week.  Maintain a healthy weight. The body mass index (BMI) is a screening tool to identify possible weight problems. It provides an estimate of  body fat based on height and weight. Your health care provider can find your BMI and can help you achieve or maintain a healthy weight.For adults 20 years and older:  A BMI below 18.5 is considered underweight.  A BMI of 18.5 to 24.9 is normal.  A BMI of 25 to 29.9 is considered overweight.  A BMI of 30 and above is considered obese.  Maintain normal blood lipids and cholesterol levels by exercising and minimizing your intake of saturated fat. Eat a balanced diet with plenty of fruit and vegetables. Blood tests for lipids and cholesterol should begin at age 21 and be repeated every 5 years. If your lipid or cholesterol levels are high, you are over 50, or you are at high risk for heart disease, you may need your cholesterol levels checked more frequently.Ongoing high lipid and cholesterol levels should be treated with medicines if diet and exercise are not working.  If you smoke, find out from your health care provider how to quit. If you do not use tobacco, do not start.  Lung cancer screening is recommended for adults aged 49-80 years who are at high risk for developing lung cancer because of a history of smoking. A yearly low-dose CT scan of the lungs is recommended for people who have at least a 30-pack-year history of smoking and are a current smoker or have quit within the past 15 years. A pack year of smoking is smoking an average of 1 pack of cigarettes a day for 1 year (for example: 1 pack a day for 30 years or 2 packs a  day for 15 years). Yearly screening should continue until the smoker has stopped smoking for at least 15 years. Yearly screening should be stopped for people who develop a health problem that would prevent them from having lung cancer treatment.  If you choose to drink alcohol, do not have more than 2 drinks per day. One drink is considered to be 12 ounces (355 mL) of beer, 5 ounces (148 mL) of wine, or 1.5 ounces (44 mL) of liquor.  Avoid use of street drugs. Do not  share needles with anyone. Ask for help if you need support or instructions about stopping the use of drugs.  High blood pressure causes heart disease and increases the risk of stroke. Your blood pressure should be checked at least every 1-2 years. Ongoing high blood pressure should be treated with medicines, if weight loss and exercise are not effective.  If you are 17-66 years old, ask your health care provider if you should take aspirin to prevent heart disease.  Diabetes screening involves taking a blood sample to check your fasting blood sugar level. This should be done once every 3 years, after age 56, if you are within normal weight and without risk factors for diabetes. Testing should be considered at a younger age or be carried out more frequently if you are overweight and have at least 1 risk factor for diabetes.  Colorectal cancer can be detected and often prevented. Most routine colorectal cancer screening begins at the age of 66 and continues through age 71. However, your health care provider may recommend screening at an earlier age if you have risk factors for colon cancer. On a yearly basis, your health care provider may provide home test kits to check for hidden blood in the stool. Use of a small camera at the end of a tube to directly examine the colon (sigmoidoscopy or colonoscopy) can detect the earliest forms of colorectal cancer. Talk to your health care provider about this at age 1, when routine screening begins. Direct exam of the colon should be repeated every 5-10 years through age 31, unless early forms of precancerous polyps or small growths are found.  People who are at an increased risk for hepatitis B should be screened for this virus. You are considered at high risk for hepatitis B if:  You were born in a country where hepatitis B occurs often. Talk with your health care provider about which countries are considered high risk.  Your parents were born in a high-risk  country and you have not received a shot to protect against hepatitis B (hepatitis B vaccine).  You have HIV or AIDS.  You use needles to inject street drugs.  You live with, or have sex with, someone who has hepatitis B.  You are a man who has sex with other men (MSM).  You get hemodialysis treatment.  You take certain medicines for conditions such as cancer, organ transplantation, and autoimmune conditions.  Hepatitis C blood testing is recommended for all people born from [birth year] through 1965 and any individual with known risks for hepatitis C.  Practice safe sex. Use condoms and avoid high-risk sexual practices to reduce the spread of sexually transmitted infections (STIs). STIs include gonorrhea, chlamydia, syphilis, trichomonas, herpes, HPV, and human immunodeficiency virus (HIV). Herpes, HIV, and HPV are viral illnesses that have no cure. They can result in disability, cancer, and death.  If you are at risk of being infected with HIV, it is recommended that you take a prescription  medicine daily to prevent HIV infection. This is called preexposure prophylaxis (PrEP). You are considered at risk if:  You are a man who has sex with other men (MSM) and have other risk factors.  You are a heterosexual man, are sexually active, and are at increased risk for HIV infection.  You take drugs by injection.  You are sexually active with a partner who has HIV.  Talk with your health care provider about whether you are at high risk of being infected with HIV. If you choose to begin PrEP, you should first be tested for HIV. You should then be tested every 3 months for as long as you are taking PrEP.  A one-time screening for abdominal aortic aneurysm (AAA) and surgical repair of large AAAs by ultrasound are recommended for men ages 36 to 16 years who are current or former smokers.  Healthy men should no longer receive prostate-specific antigen (PSA) blood tests as part of routine cancer  screening. Talk with your health care provider about prostate cancer screening.  Testicular cancer screening is not recommended for adult males who have no symptoms. Screening includes self-exam, a health care provider exam, and other screening tests. Consult with your health care provider about any symptoms you have or any concerns you have about testicular cancer.  Use sunscreen. Apply sunscreen liberally and repeatedly throughout the day. You should seek shade when your shadow is shorter than you. Protect yourself by wearing long sleeves, pants, a wide-brimmed hat, and sunglasses year round, whenever you are outdoors.  Once a month, do a whole-body skin exam, using a mirror to look at the skin on your back. Tell your health care provider about new moles, moles that have irregular borders, moles that are larger than a pencil eraser, or moles that have changed in shape or color.  Stay current with required vaccines (immunizations).  Influenza vaccine. All adults should be immunized every year.  Tetanus, diphtheria, and acellular pertussis (Td, Tdap) vaccine. An adult who has not previously received Tdap or who does not know his vaccine status should receive 1 dose of Tdap. This initial dose should be followed by tetanus and diphtheria toxoids (Td) booster doses every 10 years. Adults with an unknown or incomplete history of completing a 3-dose immunization series with Td-containing vaccines should begin or complete a primary immunization series including a Tdap dose. Adults should receive a Td booster every 10 years.  Varicella vaccine. An adult without evidence of immunity to varicella should receive 2 doses or a second dose if he has previously received 1 dose.  Human papillomavirus (HPV) vaccine. Males aged 34-21 years who have not received the vaccine previously should receive the 3-dose series. Males aged 22-26 years may be immunized. Immunization is recommended through the age of 92 years for  any male who has sex with males and did not get any or all doses earlier. Immunization is recommended for any person with an immunocompromised condition through the age of 60 years if he did not get any or all doses earlier. During the 3-dose series, the second dose should be obtained 4-8 weeks after the first dose. The third dose should be obtained 24 weeks after the first dose and 16 weeks after the second dose.  Zoster vaccine. One dose is recommended for adults aged 64 years or older unless certain conditions are present.  Measles, mumps, and rubella (MMR) vaccine. Adults born before [birth year] generally are considered immune to measles and mumps. Adults born in [birth year] or  later should have 1 or more doses of MMR vaccine unless there is a contraindication to the vaccine or there is laboratory evidence of immunity to each of the three diseases. A routine second dose of MMR vaccine should be obtained at least 28 days after the first dose for students attending postsecondary schools, health care workers, or international travelers. People who received inactivated measles vaccine or an unknown type of measles vaccine during 1963-1967 should receive 2 doses of MMR vaccine. People who received inactivated mumps vaccine or an unknown type of mumps vaccine before 1979 and are at high risk for mumps infection should consider immunization with 2 doses of MMR vaccine. Unvaccinated health care workers born before [birth year] who lack laboratory evidence of measles, mumps, or rubella immunity or laboratory confirmation of disease should consider measles and mumps immunization with 2 doses of MMR vaccine or rubella immunization with 1 dose of MMR vaccine.  Pneumococcal 13-valent conjugate (PCV13) vaccine. When indicated, a person who is uncertain of his immunization history and has no record of immunization should receive the PCV13 vaccine. An adult aged 72 years or older who has certain medical conditions and has not been previously  immunized should receive 1 dose of PCV13 vaccine. This PCV13 should be followed with a dose of pneumococcal polysaccharide (PPSV23) vaccine. The PPSV23 vaccine dose should be obtained at least 8 weeks after the dose of PCV13 vaccine. An adult aged 69 years or older who has certain medical conditions and previously received 1 or more doses of PPSV23 vaccine should receive 1 dose of PCV13. The PCV13 vaccine dose should be obtained 1 or more years after the last PPSV23 vaccine dose.  Pneumococcal polysaccharide (PPSV23) vaccine. When PCV13 is also indicated, PCV13 should be obtained first. All adults aged 26 years and older should be immunized. An adult younger than age 75 years who has certain medical conditions should be immunized. Any person who resides in a nursing home or long-term care facility should be immunized. An adult smoker should be immunized. People with an immunocompromised condition and certain other conditions should receive both PCV13 and PPSV23 vaccines. People with human immunodeficiency virus (HIV) infection should be immunized as soon as possible after diagnosis. Immunization during chemotherapy or radiation therapy should be avoided. Routine use of PPSV23 vaccine is not recommended for American Indians, [place] Natives, or people younger than 65 years unless there are medical conditions that require PPSV23 vaccine. When indicated, people who have unknown immunization and have no record of immunization should receive PPSV23 vaccine. One-time revaccination 5 years after the first dose of PPSV23 is recommended for people aged 19-64 years who have chronic kidney failure, nephrotic syndrome, asplenia, or immunocompromised conditions. People who received 1-2 doses of PPSV23 before age 67 years should receive another dose of PPSV23 vaccine at age 52 years or later if at least 5 years have passed since the previous dose. Doses of PPSV23 are not needed for people immunized with PPSV23 at or after age 35  years.  Meningococcal vaccine. Adults with asplenia or persistent complement component deficiencies should receive 2 doses of quadrivalent meningococcal conjugate (MenACWY-D) vaccine. The doses should be obtained at least 2 months apart. Microbiologists working with certain meningococcal bacteria, [place] recruits, people at risk during an outbreak, and people who travel to or live in countries with a high rate of meningitis should be immunized. A first-year college student up through age 49 years who is living in a residence hall should receive a dose if he did  not receive a dose on or after his 16th birthday. Adults who have certain high-risk conditions should receive one or more doses of vaccine.  Hepatitis A vaccine. Adults who wish to be protected from this disease, have certain high-risk conditions, work with hepatitis A-infected animals, work in hepatitis A research labs, or travel to or work in countries with a high rate of hepatitis A should be immunized. Adults who were previously unvaccinated and who anticipate close contact with an international adoptee during the first 60 days after arrival in the [place] from a country with a high rate of hepatitis A should be immunized.  Hepatitis B vaccine. Adults should be immunized if they wish to be protected from this disease, have certain high-risk conditions, may be exposed to blood or other infectious body fluids, are household contacts or sex partners of hepatitis B positive people, are clients or workers in certain care facilities, or travel to or work in countries with a high rate of hepatitis B.  Haemophilus influenzae type b (Hib) vaccine. A previously unvaccinated person with asplenia or sickle cell disease or having a scheduled splenectomy should receive 1 dose of Hib vaccine. Regardless of previous immunization, a recipient of a hematopoietic stem cell transplant should receive a 3-dose series 6-12 months after his successful transplant.  Hib vaccine is not recommended for adults with HIV infection. Preventive Service / Frequency Ages 56 to 2  Blood pressure check.** / Every 1 to 2 years.  Lipid and cholesterol check.** / Every 5 years beginning at age 43.  Hepatitis C blood test.** / For any individual with known risks for hepatitis C.  Skin self-exam. / Monthly.  Influenza vaccine. / Every year.  Tetanus, diphtheria, and acellular pertussis (Tdap, Td) vaccine.** / Consult your health care provider. 1 dose of Td every 10 years.  Varicella vaccine.** / Consult your health care provider.  HPV vaccine. / 3 doses over 6 months, if 56 or younger.  Measles, mumps, rubella (MMR) vaccine.** / You need at least 1 dose of MMR if you were born in 1957 or later. You may also need a second dose.  Pneumococcal 13-valent conjugate (PCV13) vaccine.** / Consult your health care provider.  Pneumococcal polysaccharide (PPSV23) vaccine.** / 1 to 2 doses if you smoke cigarettes or if you have certain conditions.  Meningococcal vaccine.** / 1 dose if you are age 89 to 35 years and a Market researcher living in a residence hall, or have one of several medical conditions. You may also need additional booster doses.  Hepatitis A vaccine.** / Consult your health care provider.  Hepatitis B vaccine.** / Consult your health care provider.  Haemophilus influenzae type b (Hib) vaccine.** / Consult your health care provider. Ages 38 to 2  Blood pressure check.** / Every 1 to 2 years.  Lipid and cholesterol check.** / Every 5 years beginning at age 26.  Lung cancer screening. / Every year if you are aged 29-80 years and have a 30-pack-year history of smoking and currently smoke or have quit within the past 15 years. Yearly screening is stopped once you have quit smoking for at least 15 years or develop a health problem that would prevent you from having lung cancer treatment.  Fecal occult blood test (FOBT) of stool. / Every year  beginning at age 11 and continuing until age 84. You may not have to do this test if you get a colonoscopy every 10 years.  Flexible sigmoidoscopy** or colonoscopy.** / Every 5 years  for a flexible sigmoidoscopy or every 10 years for a colonoscopy beginning at age 2 and continuing until age 81.  Hepatitis C blood test.** / For all people born from [birth year] through 1964 and any individual with known risks for hepatitis C.  Skin self-exam. / Monthly.  Influenza vaccine. / Every year.  Tetanus, diphtheria, and acellular pertussis (Tdap/Td) vaccine.** / Consult your health care provider. 1 dose of Td every 10 years.  Varicella vaccine.** / Consult your health care provider.  Zoster vaccine.** / 1 dose for adults aged 61 years or older.  Measles, mumps, rubella (MMR) vaccine.** / You need at least 1 dose of MMR if you were born in 1956 or later. You may also need a second dose.  Pneumococcal 13-valent conjugate (PCV13) vaccine.** / Consult your health care provider.  Pneumococcal polysaccharide (PPSV23) vaccine.** / 1 to 2 doses if you smoke cigarettes or if you have certain conditions.  Meningococcal vaccine.** / Consult your health care provider.  Hepatitis A vaccine.** / Consult your health care provider.  Hepatitis B vaccine.** / Consult your health care provider.  Haemophilus influenzae type b (Hib) vaccine.** / Consult your health care provider. Ages 75 and over  Blood pressure check.** / Every 1 to 2 years.  Lipid and cholesterol check.**/ Every 5 years beginning at age 93.  Lung cancer screening. / Every year if you are aged 44-80 years and have a 30-pack-year history of smoking and currently smoke or have quit within the past 15 years. Yearly screening is stopped once you have quit smoking for at least 15 years or develop a health problem that would prevent you from having lung cancer treatment.  Fecal occult blood test (FOBT) of stool. / Every year beginning at age 54 and  continuing until age 43. You may not have to do this test if you get a colonoscopy every 10 years.  Flexible sigmoidoscopy** or colonoscopy.** / Every 5 years for a flexible sigmoidoscopy or every 10 years for a colonoscopy beginning at age 86 and continuing until age 48.  Hepatitis C blood test.** / For all people born from [birth year] through 1964 and any individual with known risks for hepatitis C.  Abdominal aortic aneurysm (AAA) screening.** / A one-time screening for ages 58 to 49 years who are current or former smokers.  Skin self-exam. / Monthly.  Influenza vaccine. / Every year.  Tetanus, diphtheria, and acellular pertussis (Tdap/Td) vaccine.** / 1 dose of Td every 10 years.  Varicella vaccine.** / Consult your health care provider.  Zoster vaccine.** / 1 dose for adults aged 73 years or older.  Pneumococcal 13-valent conjugate (PCV13) vaccine.** / Consult your health care provider.  Pneumococcal polysaccharide (PPSV23) vaccine.** / 1 dose for all adults aged 8 years and older.  Meningococcal vaccine.** / Consult your health care provider.  Hepatitis A vaccine.** / Consult your health care provider.  Hepatitis B vaccine.** / Consult your health care provider.  Haemophilus influenzae type b (Hib) vaccine.** / Consult your health care provider. **Family history and personal history of risk and conditions may change your health care provider's recommendations. Document Released: 09/02/2001 Document Revised: 07/12/2013 Document Reviewed: 12/02/2010 [facility] Patient Information 2015 [place], [place]. This information is not intended to replace advice given to you by your health care provider. Make sure you discuss any questions you have with your health care provider.

## 2015-01-08 NOTE — Progress Notes (Signed)
Pre visit review using our clinic review tool, if applicable. No additional management support is needed unless otherwise documented below in the visit note. 

## 2015-01-09 ENCOUNTER — Telehealth: Payer: Self-pay | Admitting: Nurse Practitioner

## 2015-01-09 DIAGNOSIS — E559 Vitamin D deficiency, unspecified: Secondary | ICD-10-CM

## 2015-01-09 LAB — HIV ANTIBODY (ROUTINE TESTING W REFLEX): HIV 1&2 Ab, 4th Generation: NONREACTIVE

## 2015-01-09 MED ORDER — VITAMIN D3 1.25 MG (50000 UT) PO CAPS: 1.0000 | ORAL_CAPSULE | ORAL | Status: DC

## 2015-01-09 NOTE — Telephone Encounter (Signed)
pls call pt: Advise A few concerns with labs: he is prediabetic, this means that his risk for developing diabetes is higher than normal. He can reverse this by increasing exercise as discussed, and cutting out refined sugar: anything that is sweet when you eat or drink it, except fresh fruit  AND refined grains: white bread, rolls, biscuits, bagels, muffins, pasta and cereals. Choose grains with 4 gm or more of fiber per serving.   He is vitamin d deficient: start prescription. Take 1 caps weekly with meal for 3 mos.  Pls schedule OV in 3 mos to recheck A1c & vit D level.  Cholesterol panel looks better!

## 2015-01-09 NOTE — Telephone Encounter (Signed)
Patient aware of results and recommendations.  Voiced understanding.  He will CB to schedule 3 month OV closer to time.

## 2015-02-14 ENCOUNTER — Telehealth: Payer: Self-pay | Admitting: Oncology

## 2015-02-14 NOTE — Telephone Encounter (Signed)
    I called patient to move appointment to earlier due to appointment in the Stage 4 appointment in April. Per patient he can not due as early as 3. He would need a 4 or later. I did advise patient that his late appointment are for more advance cancer patients. Patient ask for provider or nurse to return call to discuss. Sent MD/RN message

## 2015-05-21 ENCOUNTER — Ambulatory Visit (INDEPENDENT_AMBULATORY_CARE_PROVIDER_SITE_OTHER): Payer: BLUE CROSS/BLUE SHIELD | Admitting: Family Medicine

## 2015-05-21 ENCOUNTER — Encounter: Payer: Self-pay | Admitting: Family Medicine

## 2015-05-21 VITALS — BP 112/68 | HR 45 | Temp 98.1°F | Resp 16 | Ht 68.5 in | Wt 197.0 lb

## 2015-05-21 DIAGNOSIS — R7303 Prediabetes: Secondary | ICD-10-CM

## 2015-05-21 DIAGNOSIS — E559 Vitamin D deficiency, unspecified: Secondary | ICD-10-CM

## 2015-05-21 LAB — VITAMIN D 25 HYDROXY (VIT D DEFICIENCY, FRACTURES): VITD: 49.66 ng/mL (ref 30.00–100.00)

## 2015-05-21 LAB — HEMOGLOBIN A1C: Hgb A1c MFr Bld: 5.9 % (ref 4.6–6.5)

## 2015-05-21 NOTE — Progress Notes (Signed)
OFFICE VISIT  05/21/2015   CC:  Chief Complaint  Patient presents with  . Follow-up    Pt is fasting.    HPI:    Patient is a 54 y.o. Caucasian male who presents for 5 mo f/u low vit D level and A1c in the prediabetic range. He has lost 4 lbs, has cut out some of the refined sweets, exercising more.   Took vit D replacement dosing x 3 mo, no OTC vit D after that. 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  Left chest region with skin growth, getting bigger lately--not pigmented.  Pt w/hx of melanoma.  Past Medical History  Diagnosis Date  . Metastatic malignant melanoma ([place]) 1989    No sign of recurrence since 1998  . Hyperlipidemia     Mild, never meds    Past Surgical History  Procedure Laterality Date  . Brain surgery      Removal of melanoma met  . Lung surgery      "              "          "        "  . Small intestine surgery      "               "         "        "  . Colonoscopy  2009/10    repeat 5 yrs.  iFOB neg 07/22/2012    Outpatient Prescriptions Prior to Visit  Medication Sig Dispense Refill  . Cholecalciferol (VITAMIN D3) 50000 UNITS CAPS Take 1 capsule by mouth every 7 (seven) days. 12 capsule 0  . Omega-3 Fatty Acids (FISH OIL) 1000 MG CAPS Take by mouth.     No facility-administered medications prior to visit.    Allergies  Allergen Reactions  . Aspirin Nausea And Vomiting  . Penicillins     childhood    ROS As per HPI  PE: Blood pressure 112/68, pulse 45, temperature 98.1 F (36.7 C), temperature source Oral, resp. rate 16, height 5' 8.5" (1.74 m), weight 197 lb (89.359 kg), SpO2 96 %. Gen: Alert, well appearing.  Patient is oriented to person, place, time, and situation. On torso he has two small pink papules --both sites of a tick bite that appear non-inflamed and non-infected. Left pectoralis region with approx 1 cm oval waxy, flesh colored "stuck-on" plaque.  LABS:  Lab Results  Component  Value Date   HGBA1C 5.9 01/08/2015     Chemistry      Component Value Date/Time   NA 137 01/08/2015 1112   NA 141 10/19/2014 0959   K 4.5 01/08/2015 1112   K 4.3 10/19/2014 0959   CL 103 01/08/2015 1112   CL 104 10/06/2012 1546   CO2 29 01/08/2015 1112   CO2 24 10/19/2014 0959   BUN 11 01/08/2015 1112   BUN 9.8 10/19/2014 0959   CREATININE 0.85 01/08/2015 1112   CREATININE 0.9 10/19/2014 0959      Component Value Date/Time   CALCIUM 9.1 01/08/2015 1112   CALCIUM 8.7 10/19/2014 0959   ALKPHOS 47 01/08/2015 1112   ALKPHOS 48 10/19/2014 0959   AST 17 01/08/2015 1112   AST 25 10/19/2014 0959   ALT 25 01/08/2015 1112   ALT 34 10/19/2014 0959   BILITOT 0.4 01/08/2015 1112   BILITOT 0.62 10/19/2014  6286     Lab Results  Component Value Date   WBC 6.4 01/08/2015   HGB 15.7 01/08/2015   HCT 45.9 01/08/2015   MCV 86.2 01/08/2015   PLT 209.0 01/08/2015   Lab Results  Component Value Date   CHOL 222* 01/08/2015   HDL 44.00 01/08/2015   LDLCALC 149* 01/08/2015   TRIG 144.0 01/08/2015   CHOLHDL 5 01/08/2015    IMPRESSION AND PLAN:  1) Prediabetes: continue TLC. Recheck HbA1c today.  2) Vit D deficiency: s/p high dose replacement vit D.  Recheck vit D level today.  3) tick bites: appear normal-reassured pt.  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 Reassured pt.  An After Visit Summary was printed and given to the patient.  FOLLOW UP: Return in about 8 months (around 01/18/2016) for annual CPE (fasting).

## 2015-05-21 NOTE — Progress Notes (Signed)
Pre visit review using our clinic review tool, if applicable. No additional management support is needed unless otherwise documented below in the visit note. 

## 2015-11-06 ENCOUNTER — Telehealth: Payer: Self-pay | Admitting: Oncology

## 2015-11-06 NOTE — Telephone Encounter (Signed)
Patient called and r/s 4/20 lab to 5/16 and 4/27 f/u to 5/23. Patient has new date/times.

## 2015-11-08 ENCOUNTER — Other Ambulatory Visit: Payer: BLUE CROSS/BLUE SHIELD

## 2015-11-15 ENCOUNTER — Ambulatory Visit: Payer: BLUE CROSS/BLUE SHIELD | Admitting: Oncology

## 2015-12-03 ENCOUNTER — Other Ambulatory Visit: Payer: Self-pay | Admitting: *Deleted

## 2015-12-03 DIAGNOSIS — C439 Malignant melanoma of skin, unspecified: Secondary | ICD-10-CM

## 2015-12-03 DIAGNOSIS — C799 Secondary malignant neoplasm of unspecified site: Secondary | ICD-10-CM

## 2015-12-04 ENCOUNTER — Other Ambulatory Visit (HOSPITAL_BASED_OUTPATIENT_CLINIC_OR_DEPARTMENT_OTHER): Payer: BLUE CROSS/BLUE SHIELD

## 2015-12-04 DIAGNOSIS — Z85820 Personal history of malignant melanoma of skin: Secondary | ICD-10-CM | POA: Diagnosis not present

## 2015-12-04 DIAGNOSIS — C799 Secondary malignant neoplasm of unspecified site: Secondary | ICD-10-CM

## 2015-12-04 DIAGNOSIS — C439 Malignant melanoma of skin, unspecified: Secondary | ICD-10-CM

## 2015-12-04 LAB — CBC WITH DIFFERENTIAL/PLATELET
BASO%: 0.3 % (ref 0.0–2.0)
Basophils Absolute: 0 10*3/uL (ref 0.0–0.1)
EOS%: 1.3 % (ref 0.0–7.0)
Eosinophils Absolute: 0.1 10*3/uL (ref 0.0–0.5)
HCT: 41.6 % (ref 38.4–49.9)
HGB: 14.6 g/dL (ref 13.0–17.1)
LYMPH%: 28 % (ref 14.0–49.0)
MCH: 29.9 pg (ref 27.2–33.4)
MCHC: 35.1 g/dL (ref 32.0–36.0)
MCV: 85.2 fL (ref 79.3–98.0)
MONO#: 0.6 10*3/uL (ref 0.1–0.9)
MONO%: 8.2 % (ref 0.0–14.0)
NEUT#: 4.3 10*3/uL (ref 1.5–6.5)
NEUT%: 62.2 % (ref 39.0–75.0)
Platelets: 228 10*3/uL (ref 140–400)
RBC: 4.88 10*6/uL (ref 4.20–5.82)
RDW: 12.3 % (ref 11.0–14.6)
WBC: 6.8 10*3/uL (ref 4.0–10.3)
lymph#: 1.9 10*3/uL (ref 0.9–3.3)

## 2015-12-04 LAB — COMPREHENSIVE METABOLIC PANEL
ALT: 25 U/L (ref 0–55)
AST: 17 U/L (ref 5–34)
Albumin: 3.8 g/dL (ref 3.5–5.0)
Alkaline Phosphatase: 45 U/L (ref 40–150)
Anion Gap: 7 mEq/L (ref 3–11)
BUN: 13.8 mg/dL (ref 7.0–26.0)
CO2: 26 mEq/L (ref 22–29)
Calcium: 8.9 mg/dL (ref 8.4–10.4)
Chloride: 107 mEq/L (ref 98–109)
Creatinine: 0.9 mg/dL (ref 0.7–1.3)
EGFR: >90 mL/min/{1.73_m2} (ref >90)
Glucose: 88 mg/dl (ref 70–140)
Potassium: 3.9 mEq/L (ref 3.5–5.1)
Sodium: 139 mEq/L (ref 136–145)
Total Bilirubin: 0.43 mg/dL (ref 0.20–1.20)
Total Protein: 7.1 g/dL (ref 6.4–8.3)

## 2015-12-11 ENCOUNTER — Telehealth: Payer: Self-pay | Admitting: Oncology

## 2015-12-11 ENCOUNTER — Ambulatory Visit (HOSPITAL_BASED_OUTPATIENT_CLINIC_OR_DEPARTMENT_OTHER): Payer: BLUE CROSS/BLUE SHIELD | Admitting: Oncology

## 2015-12-11 VITALS — BP 131/88 | HR 60 | Temp 98.0°F | Resp 18 | Ht 68.5 in | Wt 205.6 lb

## 2015-12-11 DIAGNOSIS — C7931 Secondary malignant neoplasm of brain: Secondary | ICD-10-CM | POA: Insufficient documentation

## 2015-12-11 DIAGNOSIS — Z85820 Personal history of malignant melanoma of skin: Secondary | ICD-10-CM

## 2015-12-11 DIAGNOSIS — C439 Malignant melanoma of skin, unspecified: Secondary | ICD-10-CM

## 2015-12-11 NOTE — Telephone Encounter (Signed)
sched appt for dermatology per GM 5/23 pof. No other appts needed for this office

## 2015-12-11 NOTE — Progress Notes (Signed)
ID: Sean [name]   DOB: 03-04-1961  MR#: [id number]  CSN#:[id number]  PCP: Sean Sou, MD OTHER MD: [place]  INTERVAL HISTORY: Sean [name] returns today for followup of his remote metastatic malignant melanoma. The interval history is unremarkable.Marland Kitchen He still works for [employer], now 14 years without company.. Son Sean [name] is working as an Press photographer and son Sean [name], in the tenth grade, is first pitcher for the high school team.  REVIEW OF SYSTEMS: Sean [name] generally feels healthy. He denies any unusual headaches, visual changes, nausea, vomiting, dizziness, or gait imbalance. There have been no unusual areas of pain, no rash, no bleeding. There has been no unexplained weight gain or unexplained fatigue. He denies fevers, cough, phlegm production, or pleurisy. A detailed review of systems today was otherwise stable  SOCIAL HISTORY: Sean [name] works for [employer], mostly a [place] route, with a lot of driving. His son Sean [name] works for [employer] and is in [employer] reserves, eventually to be in an Conservation officer, nature. Son Sean [name] is in the seventh grade.  HEALTH MAINTENANCE: Social History  Substance Use Topics  . Smoking status: Never Smoker   . Smokeless tobacco: Former Systems developer  . Alcohol Use: No     Colonoscopy: 2010  Lipid panel: on red yeast rice  Allergies  Allergen Reactions  . Aspirin Nausea And Vomiting  . Penicillins     childhood    Current Outpatient Prescriptions  Medication Sig Dispense Refill  . Cholecalciferol (VITAMIN D3) 50000 UNITS CAPS Take 1 capsule by mouth every 7 (seven) days. 12 capsule 0  . Omega-3 Fatty Acids (FISH OIL) 1000 MG CAPS Take by mouth.     No current facility-administered medications for this visit.    OBJECTIVE: Middle-aged white [name] Who appears well Filed Vitals:   12/11/15 1545  BP: 131/88  Pulse: 60  Temp: 98 F (36.7 C)  Resp: 18     Body mass index is 30.8 kg/(m^2).    ECOG FS: 0  Sclerae unicteric, pupils round and  equal Oropharynx clear and moist-- no thrush or other lesions No cervical or supraclavicular adenopathy Lungs no rales or rhonchi Heart regular rate and rhythm Abd soft, nontender, positive bowel sounds MSK no focal spinal tenderness, no upper extremity lymphedema Neuro: nonfocal, well oriented, appropriate affect Skin: I do not see any suspicious lesions on the chest or legs    LAB RESULTS:    Lab Results  Component Value Date   WBC 6.8 12/04/2015   NEUTROABS 4.3 12/04/2015   HGB 14.6 12/04/2015   HCT 41.6 12/04/2015   MCV 85.2 12/04/2015   PLT 228 12/04/2015      Chemistry      Component Value Date/Time   NA 139 12/04/2015 1551   NA 137 01/08/2015 1112   K 3.9 12/04/2015 1551   K 4.5 01/08/2015 1112   CL 103 01/08/2015 1112   CL 104 10/06/2012 1546   CO2 26 12/04/2015 1551   CO2 29 01/08/2015 1112   BUN 13.8 12/04/2015 1551   BUN 11 01/08/2015 1112   CREATININE 0.9 12/04/2015 1551   CREATININE 0.85 01/08/2015 1112      Component Value Date/Time   CALCIUM 8.9 12/04/2015 1551   CALCIUM 9.1 01/08/2015 1112   ALKPHOS 45 12/04/2015 1551   ALKPHOS 47 01/08/2015 1112   AST 17 12/04/2015 1551   AST 17 01/08/2015 1112   ALT 25 12/04/2015 1551   ALT 25 01/08/2015 1112   BILITOT 0.43 12/04/2015 1551  BILITOT 0.4 01/08/2015 1112       STUDIES: No results found.   ASSESSMENT: 55 y.o. Sean [name] with a history of malignant melanoma dating to November 1989 when he had a Clark's level III, Breslow depth 1.0 left forearm melanoma removed.  It recurred to the left upper arm in February 1993, surgically removed.  He then had further recurrences to the left back, left lower lobe of the lung (removed in 1995), solitary brain lesion removed in September 1996, and at that point he received the [facility] regimen chemotherapy with a right lower lobe lung lesion as measurable disease. The chemotherapy was ineffective and the right lung lesion was removed in November 1996.  In  February 1997 he had a small bowel lesion removed followed by a mesenteric lesion removed in 1998.There have been no further recurrences.  PLAN:  Sean [name] is now nearly 20 years out from his last evidence of metastatic melanoma. Clinically he remains in remission.  At this point I feel comfortable releasing him to his primary care physician, Dr Sean [name]  As far as melanoma screening is concerned she will need a yearly skin exam and a yearly retinal exam area he obtains the skin exam through Dr. Marisue [name] office and since Dr. Tonia [name] is retiring I am referring him to Dr. Renda [name] in that group. Sean [name] schedules the eye exam on a yearly basis through the [facility] and of course the rest of his health maintenance is through Dr. Anitra [name].  I will be glad to see him at any point in the future if and when the need arises, but as of now we are making no further routine appointments for [facility] here.  MAGRINAT,GUSTAV C    12/11/2015

## 2016-01-05 ENCOUNTER — Encounter: Payer: Self-pay | Admitting: Family Medicine

## 2016-05-07 ENCOUNTER — Ambulatory Visit (INDEPENDENT_AMBULATORY_CARE_PROVIDER_SITE_OTHER): Payer: BLUE CROSS/BLUE SHIELD | Admitting: Family Medicine

## 2016-05-07 ENCOUNTER — Encounter: Payer: Self-pay | Admitting: Family Medicine

## 2016-05-07 VITALS — BP 137/88 | HR 72 | Temp 98.1°F | Resp 16 | Wt 203.8 lb

## 2016-05-07 DIAGNOSIS — M67442 Ganglion, left hand: Secondary | ICD-10-CM

## 2016-05-07 NOTE — Progress Notes (Signed)
OFFICE VISIT  05/07/2016   CC:  Chief Complaint  Patient presents with  . Cyst    knot on left "pinky finger"   HPI:    Patient is a 55 y.o.  male who presents for "knot on L pinky finger".   Onset about 6 wks ago, bump on dorsal aspect of DIP of 5th digit on L hand.  It is a little bit tender/bothers him. Is growing slowly, more near the nail edge now.  The PIP joint did not hurt prior to this knot coming up.   Past Medical History:  Diagnosis Date  . History of vitamin D deficiency   . Hyperlipidemia    Mild, never meds  . Metastatic malignant melanoma ([place]) 1989   Recurrence L upper arm 1993.  Then left back and LUL lung recurrence 1995-resected.  Brain lesion recurrence 1996-resected.  RLL lung lesion 1996--chemo & then resection.  Small bowel recurrence resected 1997, then mesenteric lesion resected 1998.  No sign of recurrence since 1998.  Released by onc/Dr. Magrinot as of 12/2015 (needs annual dermatologist exam and annual ophth exam).  . Prediabetes    A1c 5.9% 12/2014    Past Surgical History:  Procedure Laterality Date  . BRAIN SURGERY     Removal of melanoma met  . COLONOSCOPY  2009/10   repeat 5 yrs.  iFOB neg 07/22/2012  . LUNG SURGERY     "              "          "        "  . SMALL INTESTINE SURGERY     "               "         "        "    Outpatient Medications Prior to Visit  Medication Sig Dispense Refill  . Cholecalciferol (VITAMIN D3) 50000 UNITS CAPS Take 1 capsule by mouth every 7 (seven) days. 12 capsule 0  . Omega-3 Fatty Acids (FISH OIL) 1000 MG CAPS Take by mouth.     No facility-administered medications prior to visit.     Allergies  Allergen Reactions  . Aspirin Nausea And Vomiting  . Penicillins     childhood    ROS As per HPI  PE: Blood pressure 137/88, pulse 72, temperature 98.1 F (36.7 C), temperature source Oral, resp. rate 16, weight 203 lb 12.8 oz (92.4 kg), SpO2 95 %. Gen: Alert, well appearing.  Patient is oriented  to person, place, time, and situation. Left hand: dorsal aspect of 5th finger at DIP joint there is a pinkish hue over a slight swelling focally.  This feels firm but I can compress it some.  It is mildly sore but non-tender.  ROM of finger fully intact.  Difficult to tell whether the nodule moves along with finger flexion/extension or not.  LABS:  none  IMPRESSION AND PLAN:  Left 5th digit extensor tendon sheath cyst suspected. Pt with hx of melanoma, asks if I think it is cancer recurrence.  I said I do not think so but I encouraged him to have his dermatologist take a look at it, too.  He said he'll hold off at this time. No sign of any hand/finger arthritis.   No need for imaging at this time. Signs/symptoms to call or return for were reviewed and pt expressed understanding. Instructions: Take 2 otc naproxen (generic is fine) every  12 hours for 2 weeks.   Then take this med every 12 hours as needed for pain.   An After Visit Summary was printed and given to the patient.  FOLLOW UP: Return for keep appt already scheduled. Oct 30th.  I'll follow up his finger nodule in 4 mo and if not improved will possibly check finger x-ray and ask ortho to see him.  Signed:  Crissie Sickles, MD           05/07/2016

## 2016-05-07 NOTE — Progress Notes (Signed)
Pre visit review using our clinic review tool, if applicable. No additional management support is needed unless otherwise documented below in the visit note. 

## 2016-05-07 NOTE — Patient Instructions (Signed)
Take 2 otc naproxen (generic is fine) every 12 hours for 2 weeks.   Then take this med every 12 hours as needed for pain.

## 2016-05-19 ENCOUNTER — Ambulatory Visit (INDEPENDENT_AMBULATORY_CARE_PROVIDER_SITE_OTHER): Payer: BLUE CROSS/BLUE SHIELD | Admitting: Family Medicine

## 2016-05-19 ENCOUNTER — Encounter: Payer: Self-pay | Admitting: Family Medicine

## 2016-05-19 VITALS — BP 126/76 | HR 56 | Temp 97.2°F | Resp 16 | Ht 68.75 in | Wt 209.8 lb

## 2016-05-19 DIAGNOSIS — Z8639 Personal history of other endocrine, nutritional and metabolic disease: Secondary | ICD-10-CM

## 2016-05-19 DIAGNOSIS — R7301 Impaired fasting glucose: Secondary | ICD-10-CM | POA: Diagnosis not present

## 2016-05-19 DIAGNOSIS — Z1211 Encounter for screening for malignant neoplasm of colon: Secondary | ICD-10-CM

## 2016-05-19 DIAGNOSIS — Z125 Encounter for screening for malignant neoplasm of prostate: Secondary | ICD-10-CM | POA: Diagnosis not present

## 2016-05-19 DIAGNOSIS — Z0000 Encounter for general adult medical examination without abnormal findings: Secondary | ICD-10-CM | POA: Diagnosis not present

## 2016-05-19 LAB — CBC WITH DIFFERENTIAL/PLATELET
Basophils Absolute: 0 10*3/uL (ref 0.0–0.1)
Basophils Relative: 0.4 % (ref 0.0–3.0)
Eosinophils Absolute: 0.1 10*3/uL (ref 0.0–0.7)
Eosinophils Relative: 0.8 % (ref 0.0–5.0)
HCT: 44 % (ref 39.0–52.0)
Hemoglobin: 15 g/dL (ref 13.0–17.0)
Lymphocytes Relative: 25.6 % (ref 12.0–46.0)
Lymphs Abs: 1.7 10*3/uL (ref 0.7–4.0)
MCHC: 34.2 g/dL (ref 30.0–36.0)
MCV: 87 fl (ref 78.0–100.0)
Monocytes Absolute: 0.5 10*3/uL (ref 0.1–1.0)
Monocytes Relative: 6.9 % (ref 3.0–12.0)
Neutro Abs: 4.4 10*3/uL (ref 1.4–7.7)
Neutrophils Relative %: 66.3 % (ref 43.0–77.0)
Platelets: 242 10*3/uL (ref 150.0–400.0)
RBC: 5.06 Mil/uL (ref 4.22–5.81)
RDW: 12.7 % (ref 11.5–15.5)
WBC: 6.6 10*3/uL (ref 4.0–10.5)

## 2016-05-19 LAB — COMPREHENSIVE METABOLIC PANEL
ALT: 23 U/L (ref 0–53)
AST: 16 U/L (ref 0–37)
Albumin: 4.1 g/dL (ref 3.5–5.2)
Alkaline Phosphatase: 45 U/L (ref 39–117)
BUN: 13 mg/dL (ref 6–23)
CO2: 26 mEq/L (ref 19–32)
Calcium: 8.9 mg/dL (ref 8.4–10.5)
Chloride: 104 mEq/L (ref 96–112)
Creatinine, Ser: 0.83 mg/dL (ref 0.40–1.50)
GFR: 102.34 mL/min (ref >60.00)
Glucose, Bld: 98 mg/dL (ref 70–99)
Potassium: 4.1 mEq/L (ref 3.5–5.1)
Sodium: 140 mEq/L (ref 135–145)
Total Bilirubin: 0.4 mg/dL (ref 0.2–1.2)
Total Protein: 6.8 g/dL (ref 6.0–8.3)

## 2016-05-19 LAB — LIPID PANEL
Cholesterol: 209 mg/dL — ABNORMAL HIGH (ref 0–200)
HDL: 42.9 mg/dL (ref >39.00)
LDL Cholesterol: 127 mg/dL — ABNORMAL HIGH (ref 0–99)
NonHDL: 166.1
Total CHOL/HDL Ratio: 5
Triglycerides: 198 mg/dL — ABNORMAL HIGH (ref 0.0–149.0)
VLDL: 39.6 mg/dL (ref 0.0–40.0)

## 2016-05-19 LAB — VITAMIN D 25 HYDROXY (VIT D DEFICIENCY, FRACTURES): VITD: 16.18 ng/mL — ABNORMAL LOW (ref 30.00–100.00)

## 2016-05-19 LAB — HEMOGLOBIN A1C: Hgb A1c MFr Bld: 6 % (ref 4.6–6.5)

## 2016-05-19 LAB — TSH: TSH: 2.61 u[IU]/mL (ref 0.35–4.50)

## 2016-05-19 NOTE — Progress Notes (Signed)
Office Note 05/19/2016  CC:  Chief Complaint  Patient presents with  . Annual Exam    CPE    HPI:  Sean [name] is a 55 y.o. White male who is here for annual health maintenance exam. Vit D: not taking any vit D consistently in last several months.    No acute complaints.  Past Medical History:  Diagnosis Date  . History of vitamin D deficiency   . Hyperlipidemia    Mild, never meds  . Metastatic malignant melanoma ([place]) 1989   Recurrence L upper arm 1993.  Then left back and LUL lung recurrence 1995-resected.  Brain lesion recurrence 1996-resected.  RLL lung lesion 1996--chemo & then resection.  Small bowel recurrence resected 1997, then mesenteric lesion resected 1998.  No sign of recurrence since 1998.  Released by onc/Dr. Magrinot as of 12/2015 (needs annual dermatologist exam and annual ophth exam).  . Prediabetes    A1c 5.9% 12/2014    Past Surgical History:  Procedure Laterality Date  . BRAIN SURGERY     Removal of melanoma met  . COLONOSCOPY  2009/10   repeat 5 yrs.  iFOB neg 07/22/2012  . LUNG SURGERY     "              "          "        "  . SMALL INTESTINE SURGERY     "               "         "        "    Family History  Problem Relation Age of Onset  . Cancer Mother     breast  . Alcohol abuse Father     Social History   Social History  . Marital status: Married    Spouse name: N/A  . Number of children: 2  . Years of education: N/A   Occupational History  . Not on file.   Social History Main Topics  . Smoking status: Never Smoker  . Smokeless tobacco: Former Systems developer  . Alcohol use No  . Drug use: No  . Sexual activity: Yes   Other Topics Concern  . Not on file   Social History Narrative   Married, 2 sons.   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   No T/A/Ds.   Diet ok except fast foods.    Outpatient Medications Prior to Visit  Medication Sig Dispense Refill  . Cholecalciferol (VITAMIN D3) 50000 UNITS CAPS  Take 1 capsule by mouth every 7 (seven) days. 12 capsule 0  . Omega-3 Fatty Acids (FISH OIL) 1000 MG CAPS Take by mouth.     No facility-administered medications prior to visit.     Allergies  Allergen Reactions  . Aspirin Nausea And Vomiting  . Penicillins     childhood    ROS Review of Systems  Constitutional: Negative for appetite change, chills, fatigue and fever.  HENT: Negative for congestion, dental problem, ear pain and sore throat.   Eyes: Negative for discharge, redness and visual disturbance.  Respiratory: Negative for cough, chest tightness, shortness of breath and wheezing.   Cardiovascular: Negative for chest pain, palpitations and leg swelling.  Gastrointestinal: Negative for abdominal pain, blood in stool, diarrhea, nausea and vomiting.  Genitourinary: Negative for difficulty urinating, dysuria, flank pain, frequency, hematuria and urgency.  Musculoskeletal: Negative for arthralgias, back pain,  joint swelling, myalgias (intermittent L groin spasm type pain) and neck stiffness.  Skin: Negative for pallor and rash.  Neurological: Negative for dizziness, speech difficulty, weakness and headaches.  Hematological: Negative for adenopathy. Does not bruise/bleed easily.  Psychiatric/Behavioral: Negative for confusion and sleep disturbance. The patient is not nervous/anxious.     PE; Blood pressure 126/76, pulse (!) 56, temperature 97.2 F (36.2 C), temperature source Temporal, resp. rate 16, height 5' 8.75" (1.746 m), weight 209 lb 12.8 oz (95.2 kg), SpO2 96 %. Gen: Alert, well appearing.  Patient is oriented to person, place, time, and situation. AFFECT: pleasant, lucid thought and speech. ENT: Ears: EACs clear, normal epithelium.  TMs with good light reflex and landmarks bilaterally.  Eyes: no injection, icteris, swelling, or exudate.  EOMI, PERRLA. Nose: no drainage or turbinate edema/swelling.  No injection or focal lesion.  Mouth: lips without lesion/swelling.  Oral  mucosa pink and moist.  Dentition intact and without obvious caries or gingival swelling.  Oropharynx without erythema, exudate, or swelling.  Neck: supple/nontender.  No LAD, mass, or TM.  Carotid pulses 2+ bilaterally, without bruits. CV: RRR, no m/r/g.   LUNGS: CTA bilat, nonlabored resps, good aeration in all lung fields. ABD: soft, NT, ND, BS normal.  No hepatospenomegaly or mass.  No bruits. EXT: no clubbing, cyanosis, or edema.  Musculoskeletal: no joint swelling, erythema, warmth, or tenderness.  ROM of all joints intact. Skin - no sores or suspicious lesions or rashes or color changes Rectal exam: negative without mass, lesions or tenderness, PROSTATE EXAM: smooth and symmetric without nodules or tenderness.   Pertinent labs:  Lab Results  Component Value Date   TSH 3.49 01/08/2015   Lab Results  Component Value Date   WBC 6.8 12/04/2015   HGB 14.6 12/04/2015   HCT 41.6 12/04/2015   MCV 85.2 12/04/2015   PLT 228 12/04/2015   Lab Results  Component Value Date   CREATININE 0.9 12/04/2015   BUN 13.8 12/04/2015   NA 139 12/04/2015   K 3.9 12/04/2015   CL 103 01/08/2015   CO2 26 12/04/2015   Lab Results  Component Value Date   ALT 25 12/04/2015   AST 17 12/04/2015   ALKPHOS 45 12/04/2015   BILITOT 0.43 12/04/2015   Lab Results  Component Value Date   CHOL 222 (H) 01/08/2015   Lab Results  Component Value Date   HDL 44.00 01/08/2015   Lab Results  Component Value Date   LDLCALC 149 (H) 01/08/2015   Lab Results  Component Value Date   TRIG 144.0 01/08/2015   Lab Results  Component Value Date   CHOLHDL 5 01/08/2015   Lab Results  Component Value Date   PSA 0.49 01/08/2015   PSA 0.74 10/19/2014   PSA 0.87 01/16/2014   Lab Results  Component Value Date   HGBA1C 5.9 05/21/2015    ASSESSMENT AND PLAN:   Health maintenance exam: Reviewed age and gender appropriate health maintenance issues (prudent diet, regular exercise, health risks of tobacco  and excessive alcohol, use of seatbelts, fire alarms in home, use of sunscreen).  Also reviewed age and gender appropriate health screening as well as vaccine recommendations. Pt declined flu vaccine today. Fasting HP labs drawn. Hx of vit D def: not taking supplement last 3-4 mo: check Vit D level today. Prostate ca screening: DRE normal, PSA drawn. Colon ca screening: Last TCS was 2009 by Dr. Wynetta Emery at [facility].  He was supposed to have returned for 5 yr  repeat colonoscopy but he has not done this yet.  Will help pt arrange this.  An After Visit Summary was printed and given to the patient.  FOLLOW UP:  Return in about 1 year (around 05/19/2017) for annual CPE (fasting).  Signed:  Crissie Sickles, MD           05/19/2016

## 2016-05-19 NOTE — Progress Notes (Signed)
Pre visit review using our clinic review tool, if applicable. No additional management support is needed unless otherwise documented below in the visit note. 

## 2016-05-20 ENCOUNTER — Other Ambulatory Visit (INDEPENDENT_AMBULATORY_CARE_PROVIDER_SITE_OTHER): Payer: BLUE CROSS/BLUE SHIELD

## 2016-05-20 ENCOUNTER — Encounter: Payer: Self-pay | Admitting: Family Medicine

## 2016-05-20 DIAGNOSIS — Z1211 Encounter for screening for malignant neoplasm of colon: Secondary | ICD-10-CM

## 2016-05-20 LAB — PSA: PSA: 0.68 ng/mL (ref 0.10–4.00)

## 2016-05-20 NOTE — Addendum Note (Signed)
Addended by: Ralph Dowdy on: 05/20/2016 08:12 AM   Modules accepted: Orders

## 2016-06-05 ENCOUNTER — Other Ambulatory Visit: Payer: Self-pay | Admitting: Gastroenterology

## 2016-07-21 DIAGNOSIS — Z860101 Personal history of adenomatous and serrated colon polyps: Secondary | ICD-10-CM

## 2016-07-21 DIAGNOSIS — Z86010 Personal history of colonic polyps: Secondary | ICD-10-CM

## 2016-07-21 HISTORY — DX: Personal history of adenomatous and serrated colon polyps: Z86.0101

## 2016-07-21 HISTORY — DX: Personal history of colonic polyps: Z86.010

## 2016-08-15 ENCOUNTER — Encounter (HOSPITAL_COMMUNITY): Payer: Self-pay

## 2016-08-18 ENCOUNTER — Ambulatory Visit (HOSPITAL_COMMUNITY): Payer: BLUE CROSS/BLUE SHIELD | Admitting: Anesthesiology

## 2016-08-18 ENCOUNTER — Ambulatory Visit (HOSPITAL_COMMUNITY)
Admission: RE | Admit: 2016-08-18 | Discharge: 2016-08-18 | Disposition: A | Discharge status: Home / Self Care | Payer: BLUE CROSS/BLUE SHIELD | Source: Ambulatory Visit | Attending: Gastroenterology | Admitting: Gastroenterology

## 2016-08-18 ENCOUNTER — Encounter (HOSPITAL_COMMUNITY): Payer: Self-pay | Admitting: *Deleted

## 2016-08-18 ENCOUNTER — Encounter (HOSPITAL_COMMUNITY)
Admission: RE | Disposition: A | Discharge status: Home / Self Care | Payer: Self-pay | Source: Ambulatory Visit | Attending: Gastroenterology

## 2016-08-18 DIAGNOSIS — E669 Obesity, unspecified: Secondary | ICD-10-CM | POA: Diagnosis not present

## 2016-08-18 DIAGNOSIS — D120 Benign neoplasm of cecum: Secondary | ICD-10-CM | POA: Insufficient documentation

## 2016-08-18 DIAGNOSIS — Z6830 Body mass index (BMI) 30.0-30.9, adult: Secondary | ICD-10-CM | POA: Diagnosis not present

## 2016-08-18 DIAGNOSIS — Z86010 Personal history of colonic polyps: Secondary | ICD-10-CM | POA: Diagnosis not present

## 2016-08-18 DIAGNOSIS — K635 Polyp of colon: Secondary | ICD-10-CM | POA: Insufficient documentation

## 2016-08-18 DIAGNOSIS — Z880 Allergy status to penicillin: Secondary | ICD-10-CM | POA: Insufficient documentation

## 2016-08-18 DIAGNOSIS — Z1211 Encounter for screening for malignant neoplasm of colon: Secondary | ICD-10-CM | POA: Insufficient documentation

## 2016-08-18 HISTORY — PX: COLONOSCOPY WITH PROPOFOL: SHX5780

## 2016-08-18 SURGERY — COLONOSCOPY WITH PROPOFOL
Anesthesia: Monitor Anesthesia Care

## 2016-08-18 MED ORDER — PROPOFOL 10 MG/ML IV BOLUS
INTRAVENOUS | Status: AC
  Filled 2016-08-18: qty 60

## 2016-08-18 MED ORDER — LACTATED RINGERS IV SOLN
INTRAVENOUS | Status: DC | PRN
  Administered 2016-08-18: 09:00:00 via INTRAVENOUS

## 2016-08-18 MED ORDER — GLYCOPYRROLATE 0.2 MG/ML IJ SOLN
INTRAMUSCULAR | Status: DC | PRN
  Administered 2016-08-18: 0.2 mg via INTRAVENOUS

## 2016-08-18 MED ORDER — PROPOFOL 500 MG/50ML IV EMUL
INTRAVENOUS | Status: DC | PRN
  Administered 2016-08-18: 100 ug/kg/min via INTRAVENOUS

## 2016-08-18 MED ORDER — LIDOCAINE 2% (20 MG/ML) 5 ML SYRINGE
INTRAMUSCULAR | Status: AC
  Filled 2016-08-18: qty 5

## 2016-08-18 MED ORDER — PROPOFOL 10 MG/ML IV BOLUS
INTRAVENOUS | Status: DC | PRN
Start: 2016-08-18 — End: 2016-08-18
  Administered 2016-08-18 (×2): 20 mg via INTRAVENOUS
  Administered 2016-08-18: 70 mg via INTRAVENOUS

## 2016-08-18 MED ORDER — LIDOCAINE 2% (20 MG/ML) 5 ML SYRINGE
INTRAMUSCULAR | Status: DC | PRN
  Administered 2016-08-18: 100 mg via INTRAVENOUS

## 2016-08-18 MED ORDER — SODIUM CHLORIDE 0.9 % IV SOLN: INTRAVENOUS | Status: DC

## 2016-08-18 SURGICAL SUPPLY — 22 items

## 2016-08-18 NOTE — H&P (Signed)
Procedure: Surveillance colonoscopy. 01/31/2008 colonoscopy was performed with removal of a 3 mm ascending colon sessile serrated adenomatous polyp  History: The patient is a 56 year old male born 12/06/1960. He is scheduled to undergo a surveillance colonoscopy today  Medication allergies: Penicillin  Past medical history: Metastatic melanoma  Exam: The patient is alert and lying comfortably on the endoscopy stretcher. Abdomen is soft and nontender to palpation. Lungs are clear to auscultation. Cardiac exam reveals a regular rhythm.  Plan: Proceed with surveillance colonoscopy

## 2016-08-18 NOTE — Op Note (Signed)
[facility] Patient Name: Sean [name] Procedure Date: 08/18/2016 MRN: [id number] Attending MD: Garlan Fair , MD Date of Birth: [birth year] CSN: [id number] Age: 56 Admit Type: Outpatient Procedure:                Colonoscopy Indications:              High risk colon cancer surveillance: Personal                            history of sessile serrated colon polyp (less than                            10 mm in size) with no dysplasia Providers:                Garlan Fair, MD, Laverta Baltimore RN, RN,                            Despina Pole Tech, Technician, Heide Scales, CRNA Referring MD:              Medicines:                Propofol per Anesthesia Complications:            No immediate complications. Estimated Blood Loss:     Estimated blood loss was minimal. Procedure:                Pre-Anesthesia Assessment:                           - Prior to the procedure, a History and Physical                            was performed, and patient medications and                            allergies were reviewed. The patient's tolerance of                            previous anesthesia was also reviewed. The risks                            and benefits of the procedure and the sedation                            options and risks were discussed with the patient.                            All questions were answered, and informed consent                            was obtained. Prior Anticoagulants: The patient has                            taken no previous anticoagulant or antiplatelet  agents. ASA Grade Assessment: II - A patient with                            mild systemic disease. After reviewing the risks                            and benefits, the patient was deemed in                            satisfactory condition to undergo the procedure.                           After obtaining informed consent, the colonoscope                     was passed under direct vision. Throughout the                            procedure, the patient's blood pressure, pulse, and                            oxygen saturations were monitored continuously. The                            [id number] scope was introduced through                            the anus and advanced to the the cecum, identified                            by appendiceal orifice and ileocecal valve. The                            colonoscopy was performed without difficulty. The                            patient tolerated the procedure well. The quality                            of the bowel preparation was good. The appendiceal                            orifice and the rectum were photographed. Scope In: 9:26:25 AM Scope Out: 9:45:17 AM Scope Withdrawal Time: 0 hours 14 minutes 39 seconds  Total Procedure Duration: 0 hours 18 minutes 52 seconds  Findings:      The perianal and digital rectal examinations were normal.      A 5 mm polyp was found in the cecum. The polyp was sessile. The polyp       was removed with a cold snare. Resection and retrieval were complete.      A 5 mm polyp was found in the ascending colon. The polyp was sessile.       The polyp was removed with a cold snare. Resection and retrieval were       complete.  A 3 mm polyp was found in the mid transverse colon. The polyp was       sessile. The polyp was removed with a cold biopsy forceps. Resection and       retrieval were complete.      A 5 mm polyp was found in the mid transverse colon. The polyp was       sessile. The polyp was removed with a cold snare. Resection and       retrieval were complete.      A 5 mm polyp was found in the rectum. The polyp was sessile. The polyp       was removed with a cold snare. Resection and retrieval were complete.      The exam was otherwise without abnormality. Impression:               - One 5 mm polyp in the cecum, removed  with a cold                            snare. Resected and retrieved.                           - One 5 mm polyp in the ascending colon, removed                            with a cold snare. Resected and retrieved.                           - One 3 mm polyp in the mid transverse colon,                            removed with a cold biopsy forceps. Resected and                            retrieved.                           - One 5 mm polyp in the mid transverse colon,                            removed with a cold snare. Resected and retrieved.                           - One 5 mm polyp in the rectum, removed with a cold                            snare. Resected and retrieved.                           - The examination was otherwise normal. Moderate Sedation:      N/A- Per Anesthesia Care      N/A- Per Anesthesia Care Recommendation:           - Patient has a contact number available for  emergencies. The signs and symptoms of potential                            delayed complications were discussed with the                            patient. Return to normal activities tomorrow.                            Written discharge instructions were provided to the                            patient.                           - Repeat colonoscopy date to be determined after                            pending pathology results are reviewed for                            surveillance.                           - Resume previous diet.                           - Continue present medications. Procedure Code(s):        --- Professional ---                           [phone], Colonoscopy, flexible; with removal of                            tumor(s), polyp(s), or other lesion(s) by snare                            technique                           45380, 27, Colonoscopy, flexible; with biopsy,                            single or multiple Diagnosis Code(s):        ---  Professional ---                           Z86.010, Personal history of colonic polyps                           D12.0, Benign neoplasm of cecum                           D12.2, Benign neoplasm of ascending colon                           D12.3, Benign neoplasm of transverse colon (hepatic  flexure or splenic flexure)                           K62.1, Rectal polyp CPT copyright 2016 American [employer]. All rights reserved. The codes documented in this report are preliminary and upon coder review may  be revised to meet current compliance requirements. Earle Gell, MD Garlan Fair, MD 08/18/2016 9:58:03 AM This report has been signed electronically. Number of Addenda: 0

## 2016-08-18 NOTE — Transfer of Care (Signed)
Immediate Anesthesia Transfer of Care Note  Patient: Sean [name]  Procedure(s) Performed: Procedure(s): COLONOSCOPY WITH PROPOFOL (N/A)  Patient Location: PACU  Anesthesia Type:MAC  Level of Consciousness: Patient easily awoken, sedated, comfortable, cooperative, following commands, responds to stimulation.   Airway & Oxygen Therapy: Patient spontaneously breathing, ventilating well, oxygen via simple oxygen mask.  Post-op Assessment: Report given to PACU RN, vital signs reviewed and stable, moving all extremities.   Post vital signs: Reviewed and stable.  Complications: No apparent anesthesia complications  Last Vitals:  Vitals:   08/18/16 0831 08/18/16 0952  BP: (!) 125/91 118/76  Pulse: (!) 56 62  Resp: 13 17  Temp: 36.7 C     Last Pain:  Vitals:   08/18/16 0831  TempSrc: Oral         Complications: No apparent anesthesia complications

## 2016-08-18 NOTE — Discharge Instructions (Signed)

## 2016-08-18 NOTE — Anesthesia Postprocedure Evaluation (Addendum)
Anesthesia Post Note  Patient: Sean [name]  Procedure(s) Performed: Procedure(s) (LRB): COLONOSCOPY WITH PROPOFOL (N/A)  Patient location during evaluation: Endoscopy Anesthesia Type: MAC Level of consciousness: awake and alert Pain management: pain level controlled Vital Signs Assessment: post-procedure vital signs reviewed and stable Respiratory status: spontaneous breathing, nonlabored ventilation, respiratory function stable and patient connected to nasal cannula oxygen Cardiovascular status: stable and blood pressure returned to baseline Anesthetic complications: no       Last Vitals:  Vitals:   08/18/16 0831 08/18/16 0952  BP: (!) 125/91 118/76  Pulse: (!) 56 62  Resp: 13 17  Temp: 36.7 C     Last Pain:  Vitals:   08/18/16 0831  TempSrc: Oral                 Donye Dauenhauer,JAMES TERRILL

## 2016-08-18 NOTE — Anesthesia Preprocedure Evaluation (Signed)
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  Reviewed: Allergy & Precautions, NPO status , Patient's Chart, lab work & pertinent test results  Airway Mallampati: II  TM Distance: >3 FB  Positive for:  Tracheal deviation   Dental  (+) Teeth Intact   Pulmonary  Hx of lung mass resection   breath sounds clear to auscultation       Cardiovascular negative cardio ROS   Rhythm:Regular Rate:Normal     Neuro/Psych    GI/Hepatic negative GI ROS, Neg liver ROS,   Endo/Other  negative endocrine ROS  Renal/GU negative Renal ROS     Musculoskeletal negative musculoskeletal ROS (+)   Abdominal (+) + obese,   Peds  Hematology negative hematology ROS (+)   Anesthesia Other Findings   Reproductive/Obstetrics                             Anesthesia Physical Anesthesia Plan  ASA: II  Anesthesia Plan: MAC   Post-op Pain Management:    Induction: Intravenous  Airway Management Planned: Natural Airway and Nasal Cannula  Additional Equipment:   Intra-op Plan:   Post-operative Plan:   Informed Consent: I have reviewed the patients History and Physical, chart, labs and discussed the procedure including the risks, benefits and alternatives for the proposed anesthesia with the patient or authorized representative who has indicated his/her understanding and acceptance.     Plan Discussed with: CRNA  Anesthesia Plan Comments:         Anesthesia Quick Evaluation

## 2016-08-19 ENCOUNTER — Encounter (HOSPITAL_COMMUNITY): Payer: Self-pay | Admitting: Gastroenterology

## 2016-08-29 ENCOUNTER — Encounter: Payer: Self-pay | Admitting: Family Medicine

## 2016-12-19 NOTE — Addendum Note (Signed)
Addendum  created 12/19/16 1008 by Rica Koyanagi, MD   Sign clinical note

## 2017-05-08 ENCOUNTER — Encounter: Payer: Self-pay | Admitting: Family Medicine

## 2017-05-08 ENCOUNTER — Ambulatory Visit (INDEPENDENT_AMBULATORY_CARE_PROVIDER_SITE_OTHER): Payer: BLUE CROSS/BLUE SHIELD | Admitting: Family Medicine

## 2017-05-08 VITALS — BP 129/74 | HR 45 | Temp 98.2°F | Resp 16 | Ht 68.7 in | Wt 204.2 lb

## 2017-05-08 DIAGNOSIS — K219 Gastro-esophageal reflux disease without esophagitis: Secondary | ICD-10-CM | POA: Diagnosis not present

## 2017-05-08 MED ORDER — PANTOPRAZOLE SODIUM 40 MG PO TBEC: 40.0000 mg | DELAYED_RELEASE_TABLET | Freq: Every day | ORAL | 3 refills | Status: DC

## 2017-05-08 NOTE — Patient Instructions (Signed)
Gastroesophageal Reflux Disease, Adult Normally, food travels down the esophagus and stays in the stomach to be digested. However, when a person has gastroesophageal reflux disease (GERD), food and stomach acid move back up into the esophagus. When this happens, the esophagus becomes sore and inflamed. Over time, GERD can create small holes (ulcers) in the lining of the esophagus. What are the causes? This condition is caused by a problem with the muscle between the esophagus and the stomach (lower esophageal sphincter, or LES). Normally, the LES muscle closes after food passes through the esophagus to the stomach. When the LES is weakened or abnormal, it does not close properly, and that allows food and stomach acid to go back up into the esophagus. The LES can be weakened by certain dietary substances, medicines, and medical conditions, including:  Tobacco use.  Pregnancy.  Having a hiatal hernia.  Heavy alcohol use.  Certain foods and beverages, such as coffee, chocolate, onions, and peppermint.  What increases the risk? This condition is more likely to develop in:  People who have an increased body weight.  People who have connective tissue disorders.  People who use NSAID medicines.  What are the signs or symptoms? Symptoms of this condition include:  Heartburn.  Difficult or painful swallowing.  The feeling of having a lump in the throat.  Abitter taste in the mouth.  Bad breath.  Having a large amount of saliva.  Having an upset or bloated stomach.  Belching.  Chest pain.  Shortness of breath or wheezing.  Ongoing (chronic) cough or a night-time cough.  Wearing away of tooth enamel.  Weight loss.  Different conditions can cause chest pain. Make sure to see your health care provider if you experience chest pain. How is this diagnosed? Your health care provider will take a medical history and perform a physical exam. To determine if you have mild or severe  GERD, your health care provider may also monitor how you respond to treatment. You may also have other tests, including:  An endoscopy toexamine your stomach and esophagus with a small camera.  A test thatmeasures the acidity level in your esophagus.  A test thatmeasures how much pressure is on your esophagus.  A barium swallow or modified barium swallow to show the shape, size, and functioning of your esophagus.  How is this treated? The goal of treatment is to help relieve your symptoms and to prevent complications. Treatment for this condition may vary depending on how severe your symptoms are. Your health care provider may recommend:  Changes to your diet.  Medicine.  Surgery.  Follow these instructions at home: Diet  Follow a diet as recommended by your health care provider. This may involve avoiding foods and drinks such as: ? Coffee and tea (with or without caffeine). ? Drinks that containalcohol. ? Energy drinks and sports drinks. ? Carbonated drinks or sodas. ? Chocolate and cocoa. ? Peppermint and mint flavorings. ? Garlic and onions. ? Horseradish. ? Spicy and acidic foods, including peppers, chili powder, curry powder, vinegar, hot sauces, and barbecue sauce. ? Citrus fruit juices and citrus fruits, such as oranges, lemons, and limes. ? Tomato-based foods, such as red sauce, chili, salsa, and pizza with red sauce. ? Fried and fatty foods, such as donuts, french fries, potato chips, and high-fat dressings. ? High-fat meats, such as hot dogs and fatty cuts of red and white meats, such as rib eye steak, sausage, ham, and bacon. ? High-fat dairy items, such as whole milk,   butter, and cream cheese.  Eat small, frequent meals instead of large meals.  Avoid drinking large amounts of liquid with your meals.  Avoid eating meals during the 2-3 hours before bedtime.  Avoid lying down right after you eat.  Do not exercise right after you eat. General  instructions  Pay attention to any changes in your symptoms.  Take over-the-counter and prescription medicines only as told by your health care provider. Do not take aspirin, ibuprofen, or other NSAIDs unless your health care provider told you to do so.  Do not use any tobacco products, including cigarettes, chewing tobacco, and e-cigarettes. If you need help quitting, ask your health care provider.  Wear loose-fitting clothing. Do not wear anything tight around your waist that causes pressure on your abdomen.  Raise (elevate) the head of your bed 6 inches (15cm).  Try to reduce your stress, such as with yoga or meditation. If you need help reducing stress, ask your health care provider.  If you are overweight, reduce your weight to an amount that is healthy for you. Ask your health care provider for guidance about a safe weight loss goal.  Keep all follow-up visits as told by your health care provider. This is important. Contact a health care provider if:  You have new symptoms.  You have unexplained weight loss.  You have difficulty swallowing, or it hurts to swallow.  You have wheezing or a persistent cough.  Your symptoms do not improve with treatment.  You have a hoarse voice. Get help right away if:  You have pain in your arms, neck, jaw, teeth, or back.  You feel sweaty, dizzy, or light-headed.  You have chest pain or shortness of breath.  You vomit and your vomit looks like blood or coffee grounds.  You faint.  Your stool is bloody or black.  You cannot swallow, drink, or eat. This information is not intended to replace advice given to you by your health care provider. Make sure you discuss any questions you have with your health care provider. Document Released: 04/16/2005 Document Revised: 12/05/2015 Document Reviewed: 11/01/2014 Elsevier Interactive Patient Education  2017 Elsevier Inc.  

## 2017-05-08 NOTE — Progress Notes (Signed)
OFFICE VISIT  05/08/2017   CC:  Chief Complaint  Patient presents with  . Gastroesophageal Reflux    HPI:    Patient is a 56 y.o.  male who presents for "acid reflux". Onset about 1 yr ago, fluctuating sx's c/w GERD.  Substernal burning, mild gassy stomach, some regurg of substance into mouth.  Sx's consistently worse after eating.  Lots more burping.  No dysphagia.  Mild ST occ.  No cough.  No hoarseness.  No wheezing or SOB. Varies some with diet.  He has eliminated tomatoes, garlic, spicy food.   Did otc prilosec and it helped some, then he stopped it and sx's returned worse.   Past Medical History:  Diagnosis Date  . History of adenomatous polyp of colon 07/2016  . History of vitamin D deficiency    still present as of 05/19/16  . Hyperlipidemia    Mild, never meds  . Metastatic malignant melanoma ([place]) 1989   Recurrence L upper arm 1993.  Then left back and LUL lung recurrence 1995-resected.  Brain lesion recurrence 1996-resected.  RLL lung lesion 1996--chemo & then resection.  Small bowel recurrence resected 1997, then mesenteric lesion resected 1998.  No sign of recurrence since 1998.  Released by onc/Dr. Magrinot as of 12/2015 (needs annual dermatologist exam and annual ophth exam).  . Prediabetes    A1c 5.9% 12/2014    Past Surgical History:  Procedure Laterality Date  . BRAIN SURGERY     Removal of melanoma met  . COLONOSCOPY  2009; 07/2016   Dr. Wynetta Emery at [facility]; next colonoscopy due 07/2019.  Marland Kitchen COLONOSCOPY WITH PROPOFOL N/A 08/18/2016   Tubular adenoma.  Recall 3 yrs.  Procedure: COLONOSCOPY WITH PROPOFOL;  Surgeon: Garlan Fair, MD;  Location: [facility];  Service: Endoscopy;  Laterality: N/A;  . LUNG SURGERY     "              "          "        "  . SMALL INTESTINE SURGERY     "               "         "        "    Outpatient Medications Prior to Visit  Medication Sig Dispense Refill  . cholecalciferol (VITAMIN D) 1000 units tablet Take 1,000 Units  by mouth daily.     No facility-administered medications prior to visit.     Allergies  Allergen Reactions  . Aspirin Nausea And Vomiting  . Penicillins Other (See Comments)    Childhood  Has patient had a PCN reaction causing immediate rash, facial/tongue/throat swelling, SOB or lightheadedness with hypotension: unknown Has patient had a PCN reaction causing severe rash involving mucus membranes or skin necrosis: {unkjnown Has patient had a PCN reaction that required hospitalization {no Has patient had a PCN reaction occurring within the last 10 years: no If all of the above answers are "NO", then may proceed with Cephalosporin use.    ROS As per HPI  PE: Blood pressure 129/74, pulse (!) 45, temperature 98.2 F (36.8 C), temperature source Oral, resp. rate 16, height 5' 8.7" (1.745 m), weight 204 lb 4 oz (92.6 kg), SpO2 97 %. Gen: Alert, well appearing.  Patient is oriented to person, place, time, and situation. AFFECT: pleasant, lucid thought and speech. NTI:RWER: no injection, icteris, swelling, or exudate.  EOMI, PERRLA. Mouth: lips without  lesion/swelling.  Oral mucosa pink and moist. Oropharynx without erythema, exudate, or swelling.  CV: RRR, no m/r/g.   LUNGS: CTA bilat, nonlabored resps, good aeration in all lung fields. ABD: soft, NT, ND, BS normal.  No hepatospenomegaly or mass.  No bruits. EXT: no clubbing, cyanosis, or edema.    LABS:  none  IMPRESSION AND PLAN:  GERD:  1 yr of convincing/significant sx's now. Start pantoprazole 26m qd. Continue GER diet/behavioral changes: educ handout given to pt today.  An After Visit Summary was printed and given to the patient.  FOLLOW UP: Return in about 2 months (around 07/08/2017) for f/u GERD.  Signed:  PCrissie Sickles MD           05/08/2017

## 2017-07-02 ENCOUNTER — Encounter: Payer: Self-pay | Admitting: Family Medicine

## 2017-07-02 ENCOUNTER — Ambulatory Visit (INDEPENDENT_AMBULATORY_CARE_PROVIDER_SITE_OTHER): Payer: BLUE CROSS/BLUE SHIELD | Admitting: Family Medicine

## 2017-07-02 VITALS — BP 124/80 | HR 82 | Temp 99.7°F | Resp 16 | Wt 204.0 lb

## 2017-07-02 DIAGNOSIS — J111 Influenza due to unidentified influenza virus with other respiratory manifestations: Secondary | ICD-10-CM

## 2017-07-02 DIAGNOSIS — J029 Acute pharyngitis, unspecified: Secondary | ICD-10-CM

## 2017-07-02 DIAGNOSIS — R69 Illness, unspecified: Secondary | ICD-10-CM

## 2017-07-02 LAB — POCT RAPID STREP A (OFFICE): Rapid Strep A Screen: NEGATIVE

## 2017-07-02 LAB — POC INFLUENZA A&B (BINAX/QUICKVUE)
Influenza A, POC: NEGATIVE
Influenza B, POC: NEGATIVE

## 2017-07-02 MED ORDER — CLINDAMYCIN HCL 300 MG PO CAPS: 300.0000 mg | ORAL_CAPSULE | Freq: Three times a day (TID) | ORAL | 0 refills | Status: DC

## 2017-07-02 NOTE — Progress Notes (Signed)
OFFICE VISIT  07/02/2017   CC:  Chief Complaint  Patient presents with  . Sore Throat    Temp. headache, chills    HPI:    Patient is a 56 y.o. Caucasian male who presents for "possible strep". Onset of fatigue 3 d/a, then next day had shaking chills/fever (T 89 yest). Sore throat, HA, mild diffuse achiness.  No nasal sx's, no cough. No rash.  No n/v.  At onset he had a few loose BMs, then the stools returned to solid. Appetite is down but he is drinking lots of fluids. He has not had flu vaccine.  Past Medical History:  Diagnosis Date  . History of adenomatous polyp of colon 07/2016  . History of vitamin D deficiency    still present as of 05/19/16  . Hyperlipidemia    Mild, never meds  . Metastatic malignant melanoma ([facility]) 1989   Recurrence L upper arm 1993.  Then left back and LUL lung recurrence 1995-resected.  Brain lesion recurrence 1996-resected.  RLL lung lesion 1996--chemo & then resection.  Small bowel recurrence resected 1997, then mesenteric lesion resected 1998.  No sign of recurrence since 1998.  Released by onc/Dr. Magrinot as of 12/2015 (needs annual dermatologist exam and annual ophth exam).  . Prediabetes    A1c 5.9% 12/2014    Past Surgical History:  Procedure Laterality Date  . BRAIN SURGERY     Removal of melanoma met  . COLONOSCOPY  2009; 07/2016   Dr. Johnson at [facility]; next colonoscopy due 07/2019.  . COLONOSCOPY WITH PROPOFOL N/A 08/18/2016   Tubular adenoma.  Recall 3 yrs.  Procedure: COLONOSCOPY WITH PROPOFOL;  Surgeon: Martin K Johnson, MD;  Location: [facility];  Service: Endoscopy;  Laterality: N/A;  . LUNG SURGERY     "              "          "        "  . SMALL INTESTINE SURGERY     "               "         "        "    Outpatient Medications Prior to Visit  Medication Sig Dispense Refill  . pantoprazole (PROTONIX) 40 MG tablet Take 1 tablet (40 mg total) by mouth daily. 30 tablet 3  . cholecalciferol (VITAMIN D) 1000 units tablet  Take 1,000 Units by mouth daily.     No facility-administered medications prior to visit.     Allergies  Allergen Reactions  . Aspirin Nausea And Vomiting  . Penicillins Other (See Comments)    Childhood  Has patient had a PCN reaction causing immediate rash, facial/tongue/throat swelling, SOB or lightheadedness with hypotension: unknown Has patient had a PCN reaction causing severe rash involving mucus membranes or skin necrosis: {unkjnown Has patient had a PCN reaction that required hospitalization {no Has patient had a PCN reaction occurring within the last 10 years: no If all of the above answers are "NO", then may proceed with Cephalosporin use.    ROS As per HPI  PE: Blood pressure 124/80, pulse 82, temperature 99.7 F (37.6 C), temperature source Oral, resp. rate 16, weight 204 lb (92.5 kg), SpO2 99 %. VS: noted--normal. Gen: alert, NAD, NONTOXIC APPEARING. HEENT: eyes without injection, drainage, or swelling.  Ears: EACs clear, TMs with normal light reflex and landmarks.  Nose: He has some dried, crusty exudate   adherent to mildly injected mucosa.  No purulent d/c.  No paranasal sinus TTP.  No facial swelling.  Throat and mouth without focal lesion.  Mild soft palate and posterior pharyngial swelling and erythema.  No asymmetry, no petechiae.  Some light yellow PND is noted on post pharyngeal wall. Neck: supple, no LAD.   LUNGS: CTA bilat, nonlabored resps.   CV: RRR, no m/r/g. EXT: no c/c/e SKIN: no rash  LABS:  Rapid strep NEG. Flu A/B: NEG  IMPRESSION AND PLAN:  Acute pharyngitis, somewhat of an influenza-like illness. Will send group A strep culture and empirically start clindamycin 300 mg tid x 10d (penicillin allergy). If strep clx returns neg, will give pt option of d/c abx. Signs/symptoms to call or return for were reviewed and pt expressed understanding. Tylenol 1000 mg q6h prn.  An After Visit Summary was printed and given to the patient.  FOLLOW UP:  Return if symptoms worsen or fail to improve.  Signed:  Crissie Sickles, MD           07/02/2017

## 2017-07-04 LAB — CULTURE, GROUP A STREP
MICRO NUMBER:: 81404819
SPECIMEN QUALITY:: ADEQUATE

## 2017-09-08 ENCOUNTER — Ambulatory Visit (INDEPENDENT_AMBULATORY_CARE_PROVIDER_SITE_OTHER): Payer: Managed Care, Other (non HMO) | Admitting: Family Medicine

## 2017-09-08 ENCOUNTER — Encounter: Payer: Self-pay | Admitting: Family Medicine

## 2017-09-08 VITALS — BP 130/83 | HR 56 | Temp 98.5°F | Resp 20 | Wt 207.0 lb

## 2017-09-08 DIAGNOSIS — M25561 Pain in right knee: Secondary | ICD-10-CM

## 2017-09-08 MED ORDER — NAPROXEN 500 MG PO TABS: ORAL_TABLET | ORAL | 0 refills | Status: DC

## 2017-09-08 NOTE — Progress Notes (Signed)
Sean [name] , 02-03-1961, 57 y.o., male MRN: [id number] Patient Care Team    Relationship Specialty Notifications Start End  McGowen, Adrian [place], MD PCP - General Family Medicine  01/16/14   Magrinat, Virgie Dad, MD Consulting Physician Oncology  01/16/14   Crista Luria, MD Consulting Physician Dermatology  01/16/14   Garlan Fair, MD Consulting Physician Gastroenterology  08/19/16    Comment: [facility]    Chief Complaint  Patient presents with  . Knee Pain    right side     Subjective: Pt presents for an OV with complaints of right knee pain of 2 days duration.  Associated symptoms include inability to straighten his leg yesterday. He works for [employer] and squatted down to pick up a box when he felt his knee lock and became painful. He finished his shift, but reports his knee was painful throughout  the remainder of the day. He iced it and took tylenol last night and when he awoke this morning he was able to straighten his leg. The pain has almost completely resolved. He denies redness. He endorses maybe very mild swelling.   Depression screen PHQ 2/9 09/19/2014  Decreased Interest 0  Down, Depressed, Hopeless 0  PHQ - 2 Score 0    Allergies  Allergen Reactions  . Aspirin Nausea And Vomiting  . Penicillins Other (See Comments)    Childhood  Has patient had a PCN reaction causing immediate rash, facial/tongue/throat swelling, SOB or lightheadedness with hypotension: unknown Has patient had a PCN reaction causing severe rash involving mucus membranes or skin necrosis: {unkjnown Has patient had a PCN reaction that required hospitalization {no Has patient had a PCN reaction occurring within the last 10 years: no If all of the above answers are "NO", then may proceed with Cephalosporin use.   Social History   Tobacco Use  . Smoking status: Never Smoker  . Smokeless tobacco: Former Network engineer Use Topics  . Alcohol use: Yes    Comment: occasional   Past Medical  History:  Diagnosis Date  . History of adenomatous polyp of colon 07/2016  . History of vitamin D deficiency    still present as of 05/19/16  . Hyperlipidemia    Mild, never meds  . Metastatic malignant melanoma ([place]) 1989   Recurrence L upper arm 1993.  Then left back and LUL lung recurrence 1995-resected.  Brain lesion recurrence 1996-resected.  RLL lung lesion 1996--chemo & then resection.  Small bowel recurrence resected 1997, then mesenteric lesion resected 1998.  No sign of recurrence since 1998.  Released by onc/Dr. Magrinot as of 12/2015 (needs annual dermatologist exam and annual ophth exam).  . Prediabetes    A1c 5.9% 12/2014   Past Surgical History:  Procedure Laterality Date  . BRAIN SURGERY     Removal of melanoma met  . COLONOSCOPY  2009; 07/2016   Dr. Wynetta Emery at [facility]; next colonoscopy due 07/2019.  Marland Kitchen COLONOSCOPY WITH PROPOFOL N/A 08/18/2016   Tubular adenoma.  Recall 3 yrs.  Procedure: COLONOSCOPY WITH PROPOFOL;  Surgeon: Garlan Fair, MD;  Location: [facility];  Service: Endoscopy;  Laterality: N/A;  . LUNG SURGERY     "              "          "        "  . SMALL INTESTINE SURGERY     "               "         "        "  Family History  Problem Relation Age of Onset  . Cancer Mother        breast  . Alcohol abuse Father    Allergies as of 09/08/2017      Reactions   Aspirin Nausea And Vomiting   Penicillins Other (See Comments)   Childhood Has patient had a PCN reaction causing immediate rash, facial/tongue/throat swelling, SOB or lightheadedness with hypotension: unknown Has patient had a PCN reaction causing severe rash involving mucus membranes or skin necrosis: {unkjnown Has patient had a PCN reaction that required hospitalization {no Has patient had a PCN reaction occurring within the last 10 years: no If all of the above answers are "NO", then may proceed with Cephalosporin use.      Medication List    as of 09/08/2017  1:22 PM   You have not  been prescribed any medications.     All past medical history, surgical history, allergies, family history, immunizations andmedications were updated in the EMR today and reviewed under the history and medication portions of their EMR.     ROS: Negative, with the exception of above mentioned in HPI   Objective:  BP 130/83 (BP Location: Left Arm, Patient Position: Sitting, Cuff Size: Large)   Pulse (!) 56   Temp 98.5 F (36.9 C)   Resp 20   Wt 207 lb (93.9 kg)   SpO2 98%   BMI 30.84 kg/m  Body mass index is 30.84 kg/m. Gen: Afebrile. No acute distress. Nontoxic in appearance, well developed, well nourished.  HENT: AT. [place]. MMM MSK: no erythema, no effusions. Very mild soft tissue swelling. No TTP joint line. No TTP knee. No ligament laxity, negative Lachman; Full ROM. NV intact. Skin: no rashes, purpura or petechiae.  Neuro:Normal gait. PERLA. EOMi. Alert. Oriented x3   No exam data present No results found. No results found for this or any previous visit (from the past 24 hour(s)).  Assessment/Plan: ANTARIO YASUDA is a 57 y.o. male present for OV for  Acute pain of right knee - possibly subluxation of patella, self resolved. Exam rather benign today, very mild soft tissue swelling. Pt feels it is much better . - Rest. NSAIDS. ICE. Naproxen BID with food for 3 days prescribed. Watch for GI SE. Pt agreeable to try.  - caution on squatting for next 1-2 weeks.  - work excuse provided for today.  - F/U 2-4 weeks if not improving, sooner if worsening.    Reviewed expectations re: course of current medical issues.  Discussed self-management of symptoms.  Outlined signs and symptoms indicating need for more acute intervention.  Patient verbalized understanding and all questions were answered.  Patient received an After-Visit Summary.    No orders of the defined types were placed in this encounter.    Note is dictated utilizing voice recognition software. Although  note has been proof read prior to signing, occasional typographical errors still can be missed. If any questions arise, please do not hesitate to call for verification.   electronically signed by:  Howard Pouch, DO  [place]

## 2017-09-08 NOTE — Patient Instructions (Addendum)
Take naproxen with food every 12 hours for 3 days. Continue icing, If reflux symptoms start OTC Prilosec.   Patellar Dislocation and Subluxation The kneecap (patella) is located in a groove at the end of the thigh bone (femur). Patellar dislocation and patellar subluxation are injuries that happen when the patella slips out of its normal position. In a patellar subluxation, the patella slips partly out of the groove. In a patellar dislocation, it slips all the way out of the groove. What are the causes? This condition may be caused by:  A hit to the knee.  Twisting the knee when the foot is planted.  What increases the risk? This condition is more likely to develop in:  Athletes in their teens or 64s.  People who have had this condition before.  People who play certain kinds of sports, including: ? Sports that include quick turns or changes in direction, or where there is contact, like soccer. ? Sports that require jumping, such as basketball or volleyball. ? Sports in which cleats are worn.  What are the signs or symptoms? Symptoms of this condition include:  Sudden pain in the knee.  A misshapen knee.  A popping sensation, followed by a feeling that something is out of place.  Inability to bend or straighten the knee.  Swelling in the knee.  How is this diagnosed? This condition may be diagnosed with:  A physical exam.  An X-ray exam. This may be done to see the position of the patella or to see if a bone has broken.  MRI. This may be done to look at the alignment of your knee and the ligaments that hold your patella in place.  How is this treated? Your patella may move back into place on its own when you straighten your knee. If your patella does not move back into place on its own, your health care provider will move it back into place. After your patella is back in its normal position, treatment may involve:  Wearing a knee brace to keep your knee from moving  (keep it immobilized) while it heals.  Doing exercises that help improve strength and movement in your knee.  Taking medicine to help with pain and inflammation.  Applying ice to the knee to help with pain and inflammation.  Having surgery to prevent the patella from slipping out of place or to clean out any loose cartilage in your joint. This may be needed if other treatments do not help or if the condition keeps happening.  Follow these instructions at home: If you have a brace:  Wear it as told by your health care provider. Remove it only as told by your health care provider.  Loosen the brace if your toes tingle, become numb, or turn cold and blue.  Do not let your brace get wet if it is not waterproof.  Keep the brace clean.  If your brace is not waterproof, cover it with a watertight covering&nbsp;when you take a bath or a shower. Managing pain, stiffness, and swelling  If directed, apply ice to the injured area. ? Put ice in a plastic bag. ? Place a towel between your skin and the bag. ? Leave the ice on for 20 minutes, 2-3 times a day.  Move your toes often to avoid stiffness and to lessen swelling. Activity  Return to your normal activities as told by your health care provider. Ask your health care provider what activities are safe for you.  Do exercises as  told by your health care provider. General instructions  Do not use the injured limb to support your body weight until your health care provider says that you can. Use crutches as told by your health care provider.  Take over-the-counter and prescription medicines only as told by your health care provider.  Keep all follow-up visits as told by your health care provider. This is important. How is this prevented?  Warm up and stretch before being active.  Cool down and stretch after being active.  Give your body time to rest between periods of activity.  Make sure to use equipment that fits you.  Be safe  and responsible while being active to avoid falls.  Do at least 150 minutes of moderate-intensity exercise each week, such as brisk walking or water aerobics.  Maintain physical fitness, including: ? Strength. ? Flexibility. ? Cardiovascular fitness. ? Endurance. Get help right away if:  The pain in your knee gets worse and is not relieved by medicine.  The inflammation in your knee gets worse.  Your knee catches or locks. This information is not intended to replace advice given to you by your health care provider. Make sure you discuss any questions you have with your health care provider. Document Released: 07/07/2005 Document Revised: 03/11/2016 Document Reviewed: 05/19/2015 Elsevier Interactive Patient Education  [employer].

## 2017-10-15 ENCOUNTER — Ambulatory Visit (INDEPENDENT_AMBULATORY_CARE_PROVIDER_SITE_OTHER): Payer: Managed Care, Other (non HMO) | Admitting: Family Medicine

## 2017-10-15 ENCOUNTER — Encounter: Payer: Self-pay | Admitting: Family Medicine

## 2017-10-15 VITALS — BP 127/67 | HR 47 | Temp 98.3°F | Resp 16 | Ht 69.0 in | Wt 205.4 lb

## 2017-10-15 DIAGNOSIS — R7301 Impaired fasting glucose: Secondary | ICD-10-CM

## 2017-10-15 DIAGNOSIS — Z0000 Encounter for general adult medical examination without abnormal findings: Secondary | ICD-10-CM | POA: Diagnosis not present

## 2017-10-15 DIAGNOSIS — Z125 Encounter for screening for malignant neoplasm of prostate: Secondary | ICD-10-CM

## 2017-10-15 DIAGNOSIS — Z85820 Personal history of malignant melanoma of skin: Secondary | ICD-10-CM | POA: Diagnosis not present

## 2017-10-15 DIAGNOSIS — Z8639 Personal history of other endocrine, nutritional and metabolic disease: Secondary | ICD-10-CM | POA: Diagnosis not present

## 2017-10-15 LAB — COMPREHENSIVE METABOLIC PANEL
ALT: 26 U/L (ref 0–53)
AST: 16 U/L (ref 0–37)
Albumin: 4 g/dL (ref 3.5–5.2)
Alkaline Phosphatase: 41 U/L (ref 39–117)
BUN: 13 mg/dL (ref 6–23)
CO2: 26 mEq/L (ref 19–32)
Calcium: 8.6 mg/dL (ref 8.4–10.5)
Chloride: 104 mEq/L (ref 96–112)
Creatinine, Ser: 0.76 mg/dL (ref 0.40–1.50)
GFR: 112.71 mL/min (ref >60.00)
Glucose, Bld: 104 mg/dL — ABNORMAL HIGH (ref 70–99)
Potassium: 4.1 mEq/L (ref 3.5–5.1)
Sodium: 138 mEq/L (ref 135–145)
Total Bilirubin: 0.5 mg/dL (ref 0.2–1.2)
Total Protein: 6.7 g/dL (ref 6.0–8.3)

## 2017-10-15 LAB — CBC WITH DIFFERENTIAL/PLATELET
Basophils Absolute: 0.1 10*3/uL (ref 0.0–0.1)
Basophils Relative: 1.1 % (ref 0.0–3.0)
Eosinophils Absolute: 0.1 10*3/uL (ref 0.0–0.7)
Eosinophils Relative: 1.7 % (ref 0.0–5.0)
HCT: 44.2 % (ref 39.0–52.0)
Hemoglobin: 15.2 g/dL (ref 13.0–17.0)
Lymphocytes Relative: 30.9 % (ref 12.0–46.0)
Lymphs Abs: 1.9 10*3/uL (ref 0.7–4.0)
MCHC: 34.3 g/dL (ref 30.0–36.0)
MCV: 86.6 fl (ref 78.0–100.0)
Monocytes Absolute: 0.4 10*3/uL (ref 0.1–1.0)
Monocytes Relative: 6.8 % (ref 3.0–12.0)
Neutro Abs: 3.6 10*3/uL (ref 1.4–7.7)
Neutrophils Relative %: 59.5 % (ref 43.0–77.0)
Platelets: 215 10*3/uL (ref 150.0–400.0)
RBC: 5.1 Mil/uL (ref 4.22–5.81)
RDW: 13.3 % (ref 11.5–15.5)
WBC: 6 10*3/uL (ref 4.0–10.5)

## 2017-10-15 LAB — PSA: PSA: 0.74 ng/mL (ref 0.10–4.00)

## 2017-10-15 LAB — LIPID PANEL
Cholesterol: 186 mg/dL (ref 0–200)
HDL: 45.5 mg/dL (ref >39.00)
LDL Cholesterol: 121 mg/dL — ABNORMAL HIGH (ref 0–99)
NonHDL: 140.88
Total CHOL/HDL Ratio: 4
Triglycerides: 100 mg/dL (ref 0.0–149.0)
VLDL: 20 mg/dL (ref 0.0–40.0)

## 2017-10-15 LAB — HEMOGLOBIN A1C: Hgb A1c MFr Bld: 6.2 % (ref 4.6–6.5)

## 2017-10-15 LAB — VITAMIN D 25 HYDROXY (VIT D DEFICIENCY, FRACTURES): VITD: 14.88 ng/mL — ABNORMAL LOW (ref 30.00–100.00)

## 2017-10-15 LAB — TSH: TSH: 2.4 u[IU]/mL (ref 0.35–4.50)

## 2017-10-15 NOTE — Progress Notes (Signed)
Office Note 10/15/2017  CC:  Chief Complaint  Patient presents with  . Annual Exam    Pt is fasting.    HPI:  Sean [name] is a 57 y.o. White male who is here for annual health maintenance exam.  Exercise: no formal exercise.  Busy all the time with work/home life. Diet: says it is healthy for the most part.   Past Medical History:  Diagnosis Date  . History of adenomatous polyp of colon 07/2016  . History of vitamin D deficiency    still present as of 05/19/16  . Hyperlipidemia    Mild, never meds  . Metastatic malignant melanoma ([place]) 1989   Recurrence L upper arm 1993.  Then left back and LUL lung recurrence 1995-resected.  Brain lesion recurrence 1996-resected.  RLL lung lesion 1996--chemo & then resection.  Small bowel recurrence resected 1997, then mesenteric lesion resected 1998.  No sign of recurrence since 1998.  Released by onc/Dr. Magrinot as of 12/2015 (needs annual dermatologist exam and annual ophth exam).  . Prediabetes    A1c 5.9% 12/2014    Past Surgical History:  Procedure Laterality Date  . BRAIN SURGERY     Removal of melanoma met  . COLONOSCOPY  2009; 07/2016   Dr. Wynetta Emery at [place]; next colonoscopy due 07/2019.  Marland Kitchen COLONOSCOPY WITH PROPOFOL N/A 08/18/2016   Tubular adenoma.  Recall 3 yrs.  Procedure: COLONOSCOPY WITH PROPOFOL;  Surgeon: Garlan Fair, MD;  Location: [facility];  Service: Endoscopy;  Laterality: N/A;  . LUNG SURGERY     "              "          "        "  . SMALL INTESTINE SURGERY     "               "         "        "    Family History  Problem Relation Age of Onset  . Cancer Mother        breast  . Alcohol abuse Father     Social History   Socioeconomic History  . Marital status: Married    Spouse name: Not on file  . Number of children: 2  . Years of education: Not on file  . Highest education level: Not on file  Occupational History  . Not on file  Social Needs  . Financial resource strain: Not on  file  . Food insecurity:    Worry: Not on file    Inability: Not on file  . Transportation needs:    Medical: Not on file    Non-medical: Not on file  Tobacco Use  . Smoking status: Never Smoker  . Smokeless tobacco: Former Network engineer and Sexual Activity  . Alcohol use: Yes    Comment: occasional  . Drug use: No  . Sexual activity: Yes  Lifestyle  . Physical activity:    Days per week: Not on file    Minutes per session: Not on file  . Stress: Not on file  Relationships  . Social connections:    Talks on phone: Not on file    Gets together: Not on file    Attends religious service: Not on file    Active member of club or organization: Not on file    Attends meetings of clubs or organizations: Not on file  Relationship status: Not on file  . Intimate partner violence:    Fear of current or ex partner: Not on file    Emotionally abused: Not on file    Physically abused: Not on file    Forced sexual activity: Not on file  Other Topics Concern  . Not on file  Social History Narrative   Married, 2 sons.   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   No T/A/Ds.   Diet ok except fast foods.    Outpatient Medications Prior to Visit  Medication Sig Dispense Refill  . naproxen (NAPROSYN) 500 MG tablet Start naproxen every 12 hours with food for 3 days, then use every 12 hrs PRN (Patient not taking: Reported on 10/15/2017) 30 tablet 0   No facility-administered medications prior to visit.     Allergies  Allergen Reactions  . Aspirin Nausea And Vomiting  . Penicillins Other (See Comments)    Childhood  Has patient had a PCN reaction causing immediate rash, facial/tongue/throat swelling, SOB or lightheadedness with hypotension: unknown Has patient had a PCN reaction causing severe rash involving mucus membranes or skin necrosis: {unkjnown Has patient had a PCN reaction that required hospitalization {no Has patient had a PCN reaction occurring within the  last 10 years: no If all of the above answers are "NO", then may proceed with Cephalosporin use.    ROS Review of Systems  Constitutional: Negative for appetite change, chills, fatigue and fever.  HENT: Negative for congestion, dental problem, ear pain and sore throat.   Eyes: Negative for discharge, redness and visual disturbance.  Respiratory: Negative for cough, chest tightness, shortness of breath and wheezing.   Cardiovascular: Negative for chest pain, palpitations and leg swelling.  Gastrointestinal: Negative for abdominal pain, blood in stool, diarrhea, nausea and vomiting.  Genitourinary: Negative for difficulty urinating, dysuria, flank pain, frequency, hematuria and urgency.  Musculoskeletal: Negative for arthralgias, back pain, joint swelling, myalgias and neck stiffness.  Skin: Negative for pallor and rash.  Neurological: Negative for dizziness, speech difficulty, weakness and headaches.  Hematological: Negative for adenopathy. Does not bruise/bleed easily.  Psychiatric/Behavioral: Negative for confusion and sleep disturbance. The patient is not nervous/anxious.     PE; Blood pressure 127/67, pulse (!) 47, temperature 98.3 F (36.8 C), temperature source Oral, resp. rate 16, height 5' 9"  (1.753 m), weight 205 lb 6 oz (93.2 kg), SpO2 97 %. Body mass index is 30.33 kg/m.  Gen: Alert, well appearing.  Patient is oriented to person, place, time, and situation. AFFECT: pleasant, lucid thought and speech. ENT: Ears: EACs clear, normal epithelium.  TMs with good light reflex and landmarks bilaterally.  Eyes: no injection, icteris, swelling, or exudate.  EOMI, PERRLA. Nose: no drainage or turbinate edema/swelling.  No injection or focal lesion.  Mouth: lips without lesion/swelling.  Oral mucosa pink and moist.  Dentition intact and without obvious caries or gingival swelling.  Oropharynx without erythema, exudate, or swelling.  Neck: supple/nontender.  No LAD, mass, or TM.  Carotid  pulses 2+ bilaterally, without bruits. CV: RRR, no m/r/g.   LUNGS: CTA bilat, nonlabored resps, good aeration in all lung fields. ABD: soft, NT, ND, BS normal.  No hepatospenomegaly or mass.  No bruits. EXT: no clubbing, cyanosis, or edema.  Musculoskeletal: no joint swelling, erythema, warmth, or tenderness.  ROM of all joints intact. Skin - no sores or suspicious lesions or rashes or color changes Rectal exam: negative without mass, lesions or tenderness.  Prostate: normal  size, symmetric, no nodularity or tenderness.   Pertinent labs:  Lab Results  Component Value Date   TSH 2.61 05/19/2016   Lab Results  Component Value Date   WBC 6.6 05/19/2016   HGB 15.0 05/19/2016   HCT 44.0 05/19/2016   MCV 87.0 05/19/2016   PLT 242.0 05/19/2016   Lab Results  Component Value Date   CREATININE 0.83 05/19/2016   BUN 13 05/19/2016   NA 140 05/19/2016   K 4.1 05/19/2016   CL 104 05/19/2016   CO2 26 05/19/2016   Lab Results  Component Value Date   ALT 23 05/19/2016   AST 16 05/19/2016   ALKPHOS 45 05/19/2016   BILITOT 0.4 05/19/2016   Lab Results  Component Value Date   CHOL 209 (H) 05/19/2016   Lab Results  Component Value Date   HDL 42.90 05/19/2016   Lab Results  Component Value Date   LDLCALC 127 (H) 05/19/2016   Lab Results  Component Value Date   TRIG 198.0 (H) 05/19/2016   Lab Results  Component Value Date   CHOLHDL 5 05/19/2016   Lab Results  Component Value Date   PSA 0.68 05/20/2016   PSA 0.49 01/08/2015   PSA 0.74 10/19/2014   Lab Results  Component Value Date   HGBA1C 6.0 05/19/2016   05/19/16 25 OH vit D was 16.  ASSESSMENT AND PLAN:   Health maintenance exam: Reviewed age and gender appropriate health maintenance issues (prudent diet, regular exercise, health risks of tobacco and excessive alcohol, use of seatbelts, fire alarms in home, use of sunscreen).  Also reviewed age and gender appropriate health screening as well as vaccine  recommendations. Vaccines:Tdap UTD,  Flu --pt declined.   shingrix discussed---pt not interested. Labs: HP, Hba1c (IFG), vit D (vit D def), and PSA. Prostate ca screening: DRE normal , PSA. Colon ca screening: next colonoscopy due 07/2019.  An After Visit Summary was printed and given to the patient.  FOLLOW UP:  Return in about 1 year (around 10/16/2018) for annual CPE (fasting).  Signed:  Crissie Sickles, MD           10/15/2017

## 2017-10-15 NOTE — Patient Instructions (Signed)

## 2017-10-16 ENCOUNTER — Other Ambulatory Visit: Payer: Self-pay | Admitting: *Deleted

## 2017-10-16 ENCOUNTER — Encounter: Payer: Self-pay | Admitting: Family Medicine

## 2017-10-16 DIAGNOSIS — E559 Vitamin D deficiency, unspecified: Secondary | ICD-10-CM

## 2017-10-16 MED ORDER — VITAMIN D (ERGOCALCIFEROL) 1.25 MG (50000 UNIT) PO CAPS: 50000.0000 [IU] | ORAL_CAPSULE | ORAL | 0 refills | Status: DC

## 2018-01-08 ENCOUNTER — Other Ambulatory Visit: Payer: Self-pay | Admitting: Family Medicine

## 2018-01-08 NOTE — Telephone Encounter (Signed)
RF request for vitamin D LOV: 10/15/17 Next ov: None Last written: 10/16/17 #12/ 0RF  Rx declined, pt was to start otc Vitamin D 1,000u and come in for lab recheck.

## 2018-01-11 NOTE — Telephone Encounter (Signed)
Left message for pt to call back  °

## 2018-01-14 NOTE — Telephone Encounter (Signed)
Left message for pt to call back  °

## 2018-01-18 ENCOUNTER — Encounter: Payer: Self-pay | Admitting: *Deleted

## 2018-01-18 NOTE — Telephone Encounter (Signed)
Pt has not returned my calls, letter mailed to address in EMR.

## 2018-02-16 ENCOUNTER — Other Ambulatory Visit (INDEPENDENT_AMBULATORY_CARE_PROVIDER_SITE_OTHER): Payer: Managed Care, Other (non HMO)

## 2018-02-16 DIAGNOSIS — E559 Vitamin D deficiency, unspecified: Secondary | ICD-10-CM | POA: Diagnosis not present

## 2018-02-16 LAB — VITAMIN D 25 HYDROXY (VIT D DEFICIENCY, FRACTURES): VITD: 24.18 ng/mL — ABNORMAL LOW (ref 30.00–100.00)

## 2018-02-18 ENCOUNTER — Other Ambulatory Visit: Payer: Self-pay

## 2018-02-18 ENCOUNTER — Encounter: Payer: Self-pay | Admitting: Family Medicine

## 2018-02-18 DIAGNOSIS — E559 Vitamin D deficiency, unspecified: Secondary | ICD-10-CM

## 2018-02-18 MED ORDER — VITAMIN D (ERGOCALCIFEROL) 1.25 MG (50000 UNIT) PO CAPS: ORAL_CAPSULE | ORAL | 0 refills | Status: DC

## 2018-02-18 NOTE — Telephone Encounter (Signed)
Vitamin D refill sent with new directions per Dr. Anitra Lauth.

## 2018-04-20 ENCOUNTER — Ambulatory Visit (INDEPENDENT_AMBULATORY_CARE_PROVIDER_SITE_OTHER): Payer: Managed Care, Other (non HMO) | Admitting: Family Medicine

## 2018-04-20 ENCOUNTER — Encounter: Payer: Self-pay | Admitting: Family Medicine

## 2018-04-20 VITALS — BP 155/79 | HR 50 | Temp 98.4°F | Resp 16 | Ht 69.0 in | Wt 199.0 lb

## 2018-04-20 DIAGNOSIS — H9312 Tinnitus, left ear: Secondary | ICD-10-CM

## 2018-04-20 DIAGNOSIS — H9042 Sensorineural hearing loss, unilateral, left ear, with unrestricted hearing on the contralateral side: Secondary | ICD-10-CM | POA: Diagnosis not present

## 2018-04-20 DIAGNOSIS — S161XXA Strain of muscle, fascia and tendon at neck level, initial encounter: Secondary | ICD-10-CM | POA: Diagnosis not present

## 2018-04-20 DIAGNOSIS — Z1283 Encounter for screening for malignant neoplasm of skin: Secondary | ICD-10-CM | POA: Diagnosis not present

## 2018-04-20 MED ORDER — HYDROCODONE-ACETAMINOPHEN 5-325 MG PO TABS: ORAL_TABLET | ORAL | 0 refills | Status: DC

## 2018-04-20 MED ORDER — MELOXICAM 15 MG PO TABS: ORAL_TABLET | ORAL | 0 refills | Status: DC

## 2018-04-20 MED ORDER — PREDNISONE 20 MG PO TABS: ORAL_TABLET | ORAL | 0 refills | Status: DC

## 2018-04-20 NOTE — Progress Notes (Signed)
OFFICE VISIT  04/20/2018   CC:  Chief Complaint  Patient presents with  . Neck Pain    HPI:    Patient is a 57 y.o. Caucasian male who presents for neck pain. Onset yesterday when he turned his head far to the left when driving to check behind him, felt sharp intense pain in lower part of neck on left side.  It felt ok after until about 2 hours later and the pain began, constant, impaired ability to sleep. No meds taken, no heat or ice applied.  Pain radiates around top of L shoulder and to L clavicle area. No radiation down arms, no paresthesias, no arm weakness.  Also onset of ringing in L ear and ? Decreased hearing in that ear----this started about an hour prior to the neck strain.  Past Medical History:  Diagnosis Date  . History of adenomatous polyp of colon 07/2016  . History of vitamin D deficiency    still present as of 09/2017.  High dose vit D started 10/16/17.  Increased dose to 50K twice per week 02/18/18.  Marland Kitchen Hyperlipidemia    Mild, never meds  . Metastatic malignant melanoma ([place]) 1989   Recurrence L upper arm 1993.  Then left back and LUL lung recurrence 1995-resected.  Brain lesion recurrence 1996-resected.  RLL lung lesion 1996--chemo & then resection.  Small bowel recurrence resected 1997, then mesenteric lesion resected 1998.  No sign of recurrence since 1998.  Released by onc/Dr. Magrinot as of 12/2015 (needs annual dermatologist exam and annual ophth exam).  . Prediabetes    A1c 5.9% 12/2014.  6.2% 09/2017.    Past Surgical History:  Procedure Laterality Date  . BRAIN SURGERY     Removal of melanoma met  . COLONOSCOPY  2009; 07/2016   Dr. Wynetta Emery at [place]; next colonoscopy due 07/2019.  Marland Kitchen COLONOSCOPY WITH PROPOFOL N/A 08/18/2016   Tubular adenoma.  Recall 3 yrs.  Procedure: COLONOSCOPY WITH PROPOFOL;  Surgeon: Garlan Fair, MD;  Location: [facility];  Service: Endoscopy;  Laterality: N/A;  . LUNG SURGERY     "              "          "        "  . SMALL  INTESTINE SURGERY     "               "         "        "    Outpatient Medications Prior to Visit  Medication Sig Dispense Refill  . Vitamin D, Ergocalciferol, (DRISDOL) 50000 units CAPS capsule Take one tab by mouth twice per week x 3 months 24 capsule 0   No facility-administered medications prior to visit.     Allergies  Allergen Reactions  . Aspirin Nausea And Vomiting  . Penicillins Other (See Comments)    Childhood  Has patient had a PCN reaction causing immediate rash, facial/tongue/throat swelling, SOB or lightheadedness with hypotension: unknown Has patient had a PCN reaction causing severe rash involving mucus membranes or skin necrosis: {unkjnown Has patient had a PCN reaction that required hospitalization {no Has patient had a PCN reaction occurring within the last 10 years: no If all of the above answers are "NO", then may proceed with Cephalosporin use.    ROS As per HPI  PE: Blood pressure (!) 155/79, pulse (!) 50, temperature 98.4 F (36.9 C), temperature source Oral,  resp. rate 16, height _0  (1.753 m), weight 199 lb (90.3 kg), SpO2 99 %.  Walks stiff and slow, can't find comfortable position. A&O x 4. Ears: canals normal, TMs normal. Weber: pt states that the left ear tinnitus is so loud that he can't hear the tuning fork lateralize to either ear. Rinne: air>bone AU.  Left sided neck soft tissues tight and TTP diffusely in posterolateral aspect, extending across top of trapezius on L. ROM of neck signif impaired: can't go past midline when trying to rotate to R.  Pain in L SCM region with resisted neck rotation to the left.  ROM of shoulders intact, without tenderness.     LABS:    Chemistry      Component Value Date/Time   NA 138 10/15/2017 1116   NA 139 12/04/2015 1551   K 4.1 10/15/2017 1116   K 3.9 12/04/2015 1551   CL 104 10/15/2017 1116   CL 104 10/06/2012 1546   CO2 26 10/15/2017 1116   CO2 26 12/04/2015 1551   BUN 13 10/15/2017 1116    BUN 13.8 12/04/2015 1551   CREATININE 0.76 10/15/2017 1116   CREATININE 0.9 12/04/2015 1551      Component Value Date/Time   CALCIUM 8.6 10/15/2017 1116   CALCIUM 8.9 12/04/2015 1551   ALKPHOS 41 10/15/2017 1116   ALKPHOS 45 12/04/2015 1551   AST 16 10/15/2017 1116   AST 17 12/04/2015 1551   ALT 26 10/15/2017 1116   ALT 25 12/04/2015 1551   BILITOT 0.5 10/15/2017 1116   BILITOT 0.43 12/04/2015 1551      Hearing Screening   _1  _2  _3  _4  _5  _6  _7  _8  _9   Right ear:   _10 Left ear:   20 20 45  55       IMPRESSION AND PLAN:  1) Neck strain, left sided. Encouraged 20 min heat bid, ROM exercises, massage. Prednisone x 5d (no NSAIDs)-->see #2 below. Vicodin 5/325, 1-2 tid prn.,, #42.  Short term use of this med discussed. No muscle relaxer--this has caused prolonged grogginess with past use.  2) Acute left ear tinnitus with some decreased hearing in L at 2000 and 4000 Hz.  NO hearing impairment on R.  Worry about sudden sensorineural hearing loss. Start prednisone 8m qd x 5d, referred to ENT today.  3) Skin ca screening: at the end of the visit pt requested referral to dermatologist. He has seen Dr. GTonia Broomsbut is frustrated that the next available appt for him was not until April 2019. I made referral to Dr. TDenna Haggardtoday.  An After Visit Summary was printed and given to the patient.  Spent 30 min with pt today, with >50% of this time spent in counseling and care coordination regarding the above problems.  FOLLOW UP: Return in about 2 weeks (around 05/04/2018) for f/u neck strain.  Signed:  PCrissie Sickles MD           04/20/2018

## 2018-05-03 ENCOUNTER — Ambulatory Visit: Payer: Managed Care, Other (non HMO) | Admitting: Family Medicine

## 2018-05-05 ENCOUNTER — Encounter: Payer: Self-pay | Admitting: Family Medicine

## 2018-05-11 ENCOUNTER — Other Ambulatory Visit: Payer: Self-pay | Admitting: Family Medicine

## 2018-05-11 NOTE — Telephone Encounter (Signed)
RF request for vitamin D LOV: 10/15/17 Next ov: None Last written: 02/18/18 #24 w/ 0RF  Please advise. Thanks.

## 2019-02-25 ENCOUNTER — Encounter: Payer: Managed Care, Other (non HMO) | Admitting: Family Medicine

## 2019-05-04 ENCOUNTER — Other Ambulatory Visit: Payer: Self-pay

## 2019-05-05 ENCOUNTER — Encounter: Payer: Self-pay | Admitting: Family Medicine

## 2019-05-05 ENCOUNTER — Ambulatory Visit (INDEPENDENT_AMBULATORY_CARE_PROVIDER_SITE_OTHER): Payer: Managed Care, Other (non HMO) | Admitting: Family Medicine

## 2019-05-05 VITALS — BP 120/78 | HR 47 | Temp 98.1°F | Resp 16 | Ht 69.0 in | Wt 198.4 lb

## 2019-05-05 DIAGNOSIS — Z125 Encounter for screening for malignant neoplasm of prostate: Secondary | ICD-10-CM | POA: Diagnosis not present

## 2019-05-05 DIAGNOSIS — R7303 Prediabetes: Secondary | ICD-10-CM

## 2019-05-05 DIAGNOSIS — E559 Vitamin D deficiency, unspecified: Secondary | ICD-10-CM

## 2019-05-05 DIAGNOSIS — Z0000 Encounter for general adult medical examination without abnormal findings: Secondary | ICD-10-CM

## 2019-05-05 LAB — COMPREHENSIVE METABOLIC PANEL
ALT: 19 U/L (ref 0–53)
AST: 15 U/L (ref 0–37)
Albumin: 4.3 g/dL (ref 3.5–5.2)
Alkaline Phosphatase: 48 U/L (ref 39–117)
BUN: 14 mg/dL (ref 6–23)
CO2: 28 mEq/L (ref 19–32)
Calcium: 8.8 mg/dL (ref 8.4–10.5)
Chloride: 102 mEq/L (ref 96–112)
Creatinine, Ser: 0.8 mg/dL (ref 0.40–1.50)
GFR: 99.4 mL/min (ref >60.00)
Glucose, Bld: 101 mg/dL — ABNORMAL HIGH (ref 70–99)
Potassium: 4.3 mEq/L (ref 3.5–5.1)
Sodium: 137 mEq/L (ref 135–145)
Total Bilirubin: 0.5 mg/dL (ref 0.2–1.2)
Total Protein: 6.8 g/dL (ref 6.0–8.3)

## 2019-05-05 LAB — CBC WITH DIFFERENTIAL/PLATELET
Basophils Absolute: 0 10*3/uL (ref 0.0–0.1)
Basophils Relative: 0.8 % (ref 0.0–3.0)
Eosinophils Absolute: 0.1 10*3/uL (ref 0.0–0.7)
Eosinophils Relative: 2.6 % (ref 0.0–5.0)
HCT: 43.9 % (ref 39.0–52.0)
Hemoglobin: 14.8 g/dL (ref 13.0–17.0)
Lymphocytes Relative: 32.4 % (ref 12.0–46.0)
Lymphs Abs: 1.8 10*3/uL (ref 0.7–4.0)
MCHC: 33.7 g/dL (ref 30.0–36.0)
MCV: 87 fl (ref 78.0–100.0)
Monocytes Absolute: 0.3 10*3/uL (ref 0.1–1.0)
Monocytes Relative: 5.9 % (ref 3.0–12.0)
Neutro Abs: 3.2 10*3/uL (ref 1.4–7.7)
Neutrophils Relative %: 58.3 % (ref 43.0–77.0)
Platelets: 239 10*3/uL (ref 150.0–400.0)
RBC: 5.05 Mil/uL (ref 4.22–5.81)
RDW: 13.1 % (ref 11.5–15.5)
WBC: 5.5 10*3/uL (ref 4.0–10.5)

## 2019-05-05 LAB — TSH: TSH: 2.57 u[IU]/mL (ref 0.35–4.50)

## 2019-05-05 LAB — LIPID PANEL
Cholesterol: 206 mg/dL — ABNORMAL HIGH (ref 0–200)
HDL: 46.8 mg/dL (ref >39.00)
LDL Cholesterol: 141 mg/dL — ABNORMAL HIGH (ref 0–99)
NonHDL: 159.2
Total CHOL/HDL Ratio: 4
Triglycerides: 93 mg/dL (ref 0.0–149.0)
VLDL: 18.6 mg/dL (ref 0.0–40.0)

## 2019-05-05 LAB — HEMOGLOBIN A1C: Hgb A1c MFr Bld: 6.3 % (ref 4.6–6.5)

## 2019-05-05 LAB — PSA: PSA: 0.68 ng/mL (ref 0.10–4.00)

## 2019-05-05 LAB — VITAMIN D 25 HYDROXY (VIT D DEFICIENCY, FRACTURES): VITD: 21.47 ng/mL — ABNORMAL LOW (ref 30.00–100.00)

## 2019-05-05 NOTE — Patient Instructions (Addendum)
Dr. Earle Gell at [facility]: [phone].    Health Maintenance, Male Adopting a healthy lifestyle and getting preventive care are important in promoting health and wellness. Ask your health care provider about:  The right schedule for you to have regular tests and exams.  Things you can do on your own to prevent diseases and keep yourself healthy. What should I know about diet, weight, and exercise? Eat a healthy diet   Eat a diet that includes plenty of vegetables, fruits, low-fat dairy products, and lean protein.  Do not eat a lot of foods that are high in solid fats, added sugars, or sodium. Maintain a healthy weight Body mass index (BMI) is a measurement that can be used to identify possible weight problems. It estimates body fat based on height and weight. Your health care provider can help determine your BMI and help you achieve or maintain a healthy weight. Get regular exercise Get regular exercise. This is one of the most important things you can do for your health. Most adults should:  Exercise for at least 150 minutes each week. The exercise should increase your heart rate and make you sweat (moderate-intensity exercise).  Do strengthening exercises at least twice a week. This is in addition to the moderate-intensity exercise.  Spend less time sitting. Even light physical activity can be beneficial. Watch cholesterol and blood lipids Have your blood tested for lipids and cholesterol at 57 years of age, then have this test every 5 years. You may need to have your cholesterol levels checked more often if:  Your lipid or cholesterol levels are high.  You are older than 57 years of age.  You are at high risk for heart disease. What should I know about cancer screening? Many types of cancers can be detected early and may often be prevented. Depending on your health history and family history, you may need to have cancer screening at various ages. This may  include screening for:  Colorectal cancer.  Prostate cancer.  Skin cancer.  Lung cancer. What should I know about heart disease, diabetes, and high blood pressure? Blood pressure and heart disease  High blood pressure causes heart disease and increases the risk of stroke. This is more likely to develop in people who have high blood pressure readings, are of African descent, or are overweight.  Talk with your health care provider about your target blood pressure readings.  Have your blood pressure checked: ? Every 3-5 years if you are 22-42 years of age. ? Every year if you are 27 years old or older.  If you are between the ages of 80 and 9 and are a current or former smoker, ask your health care provider if you should have a one-time screening for abdominal aortic aneurysm (AAA). Diabetes Have regular diabetes screenings. This checks your fasting blood sugar level. Have the screening done:  Once every three years after age 4 if you are at a normal weight and have a low risk for diabetes.  More often and at a younger age if you are overweight or have a high risk for diabetes. What should I know about preventing infection? Hepatitis B If you have a higher risk for hepatitis B, you should be screened for this virus. Talk with your health care provider to find out if you are at risk for hepatitis B infection. Hepatitis C Blood testing is recommended for:  Everyone born from [birth year] through 1964.  Anyone with known risk factors for hepatitis C. Sexually  transmitted infections (STIs)  You should be screened each year for STIs, including gonorrhea and chlamydia, if: ? You are sexually active and are younger than 57 years of age. ? You are older than 57 years of age and your health care provider tells you that you are at risk for this type of infection. ? Your sexual activity has changed since you were last screened, and you are at increased risk for chlamydia or gonorrhea. Ask your  health care provider if you are at risk.  Ask your health care provider about whether you are at high risk for HIV. Your health care provider may recommend a prescription medicine to help prevent HIV infection. If you choose to take medicine to prevent HIV, you should first get tested for HIV. You should then be tested every 3 months for as long as you are taking the medicine. Follow these instructions at home: Lifestyle  Do not use any products that contain nicotine or tobacco, such as cigarettes, e-cigarettes, and chewing tobacco. If you need help quitting, ask your health care provider.  Do not use street drugs.  Do not share needles.  Ask your health care provider for help if you need support or information about quitting drugs. Alcohol use  Do not drink alcohol if your health care provider tells you not to drink.  If you drink alcohol: ? Limit how much you have to 0-2 drinks a day. ? Be aware of how much alcohol is in your drink. In the [place] one drink equals one 12 oz bottle of beer (355 mL), one 5 oz glass of wine (148 mL), or one 1 oz glass of hard liquor (44 mL). General instructions  Schedule regular health, dental, and eye exams.  Stay current with your vaccines.  Tell your health care provider if: ? You often feel depressed. ? You have ever been abused or do not feel safe at home. Summary  Adopting a healthy lifestyle and getting preventive care are important in promoting health and wellness.  Follow your health care provider's instructions about healthy diet, exercising, and getting tested or screened for diseases.  Follow your health care provider's instructions on monitoring your cholesterol and blood pressure. This information is not intended to replace advice given to you by your health care provider. Make sure you discuss any questions you have with your health care provider. Document Released: 01/03/2008 Document Revised: 06/30/2018 Document Reviewed:  06/30/2018 Elsevier Patient Education  2020 [employer].

## 2019-05-05 NOTE — Progress Notes (Signed)
Office Note 05/05/2019  CC:  Chief Complaint  Patient presents with  . Annual Exam    pt is fasting    HPI:  Sean [name] is a 58 y.o. White male who is here for annual health maintenance exam.  Vit D def: has been on 1000 U vit D tab daily for at least 6 mo and this was after getting high dose replacement.  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 No formal exercise except he walks his dog daily.  Diet: mostly healthy except too much fast food.   Past Medical History:  Diagnosis Date  . Asymmetrical sensorineural hearing loss    L>>R: ENT eval 04/2018.  Pt declined MRI brain at that time.  Marland Kitchen History of adenomatous polyp of colon 07/2016  . History of vitamin D deficiency    still present as of 09/2017.  High dose vit D started 10/16/17.  Increased dose to 50K twice per week 02/18/18.  Marland Kitchen Hyperlipidemia    Mild, never meds  . Metastatic malignant melanoma ([place]) 1989   Recurrence L upper arm 1993.  Then left back and LUL lung recurrence 1995-resected.  Brain lesion recurrence 1996-resected.  RLL lung lesion 1996--chemo & then resection.  Small bowel recurrence resected 1997, then mesenteric lesion resected 1998.  No sign of recurrence since 1998.  Released by onc/Dr. Magrinot as of 12/2015 (needs annual dermatologist exam and annual ophth exam).  . Prediabetes    A1c 5.9% 12/2014.  6.2% 09/2017.    Past Surgical History:  Procedure Laterality Date  . BRAIN SURGERY     Removal of melanoma met  . COLONOSCOPY  2009; 07/2016   Dr. Wynetta Emery at [place]; next colonoscopy due 07/2019.  Marland Kitchen COLONOSCOPY WITH PROPOFOL N/A 08/18/2016   Tubular adenoma.  Recall 3 yrs.  Procedure: COLONOSCOPY WITH PROPOFOL;  Surgeon: Garlan Fair, MD;  Location: [facility];  Service: Endoscopy;  Laterality: N/A;  . LUNG SURGERY     "              "          "        "  . SMALL INTESTINE SURGERY     "               "         "        "    Family History  Problem Relation Age of Onset   . Cancer Mother        breast  . Alcohol abuse Father     Social History   Socioeconomic History  . Marital status: Married    Spouse name: Not on file  . Number of children: 2  . Years of education: Not on file  . Highest education level: Not on file  Occupational History  . Not on file  Social Needs  . Financial resource strain: Not on file  . Food insecurity    Worry: Not on file    Inability: Not on file  . Transportation needs    Medical: Not on file    Non-medical: Not on file  Tobacco Use  . Smoking status: Never Smoker  . Smokeless tobacco: Former Network engineer and Sexual Activity  . Alcohol use: Yes    Comment: occasional  . Drug use: No  . Sexual activity: Yes  Lifestyle  . Physical activity    Days per week: Not on  file    Minutes per session: Not on file  . Stress: Not on file  Relationships  . Social Herbalist on phone: Not on file    Gets together: Not on file    Attends religious service: Not on file    Active member of club or organization: Not on file    Attends meetings of clubs or organizations: Not on file    Relationship status: Not on file  . Intimate partner violence    Fear of current or ex partner: Not on file    Emotionally abused: Not on file    Physically abused: Not on file    Forced sexual activity: Not on file  Other Topics Concern  . Not on file  Social History Narrative   Married, 2 sons.   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   No T/A/Ds.   Diet ok except fast foods.    Outpatient Medications Prior to Visit  Medication Sig Dispense Refill  . Vitamin D, Ergocalciferol, (DRISDOL) 50000 units CAPS capsule TAKE ONE CAPSULE BY MOUTH TWICE A WEEK FOR 3 MONTHS. 24 capsule 1  . HYDROcodone-acetaminophen (NORCO/VICODIN) 5-325 MG tablet 1-2 tabs po tid prn moderate-to-severe pain 42 tablet 0  . predniSONE (DELTASONE) 20 MG tablet 3 tabs po qd x 5d 15 tablet 0   No facility-administered medications  prior to visit.     Allergies  Allergen Reactions  . Aspirin Nausea And Vomiting  . Penicillins Other (See Comments)    Childhood  Has patient had a PCN reaction causing immediate rash, facial/tongue/throat swelling, SOB or lightheadedness with hypotension: unknown Has patient had a PCN reaction causing severe rash involving mucus membranes or skin necrosis: {unkjnown Has patient had a PCN reaction that required hospitalization {no Has patient had a PCN reaction occurring within the last 10 years: no If all of the above answers are "NO", then may proceed with Cephalosporin use.    ROS Review of Systems  Constitutional: Negative for appetite change, chills, fatigue and fever.  HENT: Negative for congestion, dental problem, ear pain and sore throat.   Eyes: Negative for discharge, redness and visual disturbance.  Respiratory: Negative for cough, chest tightness, shortness of breath and wheezing.   Cardiovascular: Negative for chest pain, palpitations and leg swelling.  Gastrointestinal: Negative for abdominal pain, blood in stool, diarrhea, nausea and vomiting.  Genitourinary: Negative for difficulty urinating, dysuria, flank pain, frequency, hematuria and urgency.  Musculoskeletal: Negative for arthralgias, back pain, joint swelling, myalgias and neck stiffness.  Skin: Negative for pallor and rash.  Neurological: Negative for dizziness, speech difficulty, weakness and headaches.  Hematological: Negative for adenopathy. Does not bruise/bleed easily.  Psychiatric/Behavioral: Negative for confusion and sleep disturbance. The patient is not nervous/anxious.     PE; Blood pressure 120/78, pulse (!) 47, temperature 98.1 F (36.7 C), temperature source Temporal, resp. rate 16, height _0  (1.753 m), weight 198 lb 6.4 oz (90 kg), SpO2 98 %. Gen: Alert, well appearing.  Patient is oriented to person, place, time, and situation. AFFECT: pleasant, lucid thought and speech. ENT: Ears: EACs  clear, normal epithelium.  TMs with good light reflex and landmarks bilaterally.  Eyes: no injection, icteris, swelling, or exudate.  EOMI, PERRLA. Nose: no drainage or turbinate edema/swelling.  No injection or focal lesion.  Mouth: lips without lesion/swelling.  Oral mucosa pink and moist.  Dentition intact and without obvious caries or gingival swelling.  Oropharynx without erythema, exudate,  or swelling.  Neck: supple/nontender.  No LAD, mass, or TM.  Carotid pulses 2+ bilaterally, without bruits. CV: RRR, no m/r/g.   LUNGS: CTA bilat, nonlabored resps, good aeration in all lung fields. ABD: soft, NT, ND, BS normal.  No hepatospenomegaly or mass.  No bruits. EXT: no clubbing, cyanosis, or edema.  Musculoskeletal: no joint swelling, erythema, warmth, or tenderness.  ROM of all joints intact. Skin - no sores or suspicious lesions or rashes or color changes Rectal exam: negative without mass, lesions or tenderness, PROSTATE EXAM: smooth and symmetric without nodules or tenderness.   Pertinent labs:  Lab Results  Component Value Date   TSH 2.40 10/15/2017   Lab Results  Component Value Date   WBC 6.0 10/15/2017   HGB 15.2 10/15/2017   HCT 44.2 10/15/2017   MCV 86.6 10/15/2017   PLT 215.0 10/15/2017   Lab Results  Component Value Date   CREATININE 0.76 10/15/2017   BUN 13 10/15/2017   NA 138 10/15/2017   K 4.1 10/15/2017   CL 104 10/15/2017   CO2 26 10/15/2017   Lab Results  Component Value Date   ALT 26 10/15/2017   AST 16 10/15/2017   ALKPHOS 41 10/15/2017   BILITOT 0.5 10/15/2017   Lab Results  Component Value Date   CHOL 186 10/15/2017   Lab Results  Component Value Date   HDL 45.50 10/15/2017   Lab Results  Component Value Date   LDLCALC 121 (H) 10/15/2017   Lab Results  Component Value Date   TRIG 100.0 10/15/2017   Lab Results  Component Value Date   CHOLHDL 4 10/15/2017   Lab Results  Component Value Date   PSA 0.74 10/15/2017   PSA 0.68  05/20/2016   PSA 0.49 01/08/2015   Lab Results  Component Value Date   HGBA1C 6.2 10/15/2017    ASSESSMENT AND PLAN:   Health maintenance exam: Reviewed age and gender appropriate health maintenance issues (prudent diet, regular exercise, health risks of tobacco and excessive alcohol, use of seatbelts, fire alarms in home, use of sunscreen).  Also reviewed age and gender appropriate health screening as well as vaccine recommendations. Vaccines: flu->he declined today.   Shingrix-->pt declined. Labs: HP, A1c (prediabetes), PSA, vit D (vit D def). Prostate ca screening: DRE normal today , PSA. Colon ca screening: next colonoscopy due 07/2019.  An After Visit Summary was printed and given to the patient.  FOLLOW UP:  Return in about 6 months (around 11/03/2019) for annual CPE (fasting).  Signed:  Crissie Sickles, MD           05/05/2019

## 2020-05-03 ENCOUNTER — Ambulatory Visit (INDEPENDENT_AMBULATORY_CARE_PROVIDER_SITE_OTHER): Payer: Managed Care, Other (non HMO) | Admitting: Family Medicine

## 2020-05-03 ENCOUNTER — Encounter: Payer: Self-pay | Admitting: Family Medicine

## 2020-05-03 ENCOUNTER — Other Ambulatory Visit: Payer: Self-pay

## 2020-05-03 VITALS — BP 147/86 | HR 63 | Temp 98.3°F | Resp 16 | Ht 69.0 in | Wt 204.4 lb

## 2020-05-03 DIAGNOSIS — S39012A Strain of muscle, fascia and tendon of lower back, initial encounter: Secondary | ICD-10-CM

## 2020-05-03 DIAGNOSIS — X503XXA Overexertion from repetitive movements, initial encounter: Secondary | ICD-10-CM

## 2020-05-03 DIAGNOSIS — M533 Sacrococcygeal disorders, not elsewhere classified: Secondary | ICD-10-CM

## 2020-05-03 DIAGNOSIS — M545 Low back pain, unspecified: Secondary | ICD-10-CM | POA: Diagnosis not present

## 2020-05-03 NOTE — Progress Notes (Signed)
OFFICE VISIT  05/03/2020  CC:  Chief Complaint  Patient presents with  . Back Pain    x4-5 months, comes and goes at times but is worsening. Has tried using osteo bioflex starting last week and stretching but still stiff.      HPI:    Patient is a 59 y.o. Caucasian male who presents for back pain. Onset 4-5 mo ago, sore and stiff across lower back/tops of gluts. Intensity 5-6/10, "nagging", sometimes constant for days, lets up some when rests/off work a little.  No radiating pain, no tingling or numbness.  No neck pain. By the end of workday--very stiff and signif pain.   No trauma/injury/strain recalled. Doing some stretching off an on and this helps. Bringing knees up to is rear, laying on floor flat---all helps.  No heat or ice applied. Has home TENS unit and it helps a little.   Changed his mattress to firmer one but no help.  No hx of significant back problems. No back imaging in EMR.   Past Medical History:  Diagnosis Date  . Asymmetrical sensorineural hearing loss    L>>R: ENT eval 04/2018.  Pt declined MRI brain at that time.  Marland Kitchen History of adenomatous polyp of colon 07/2016  . History of vitamin D deficiency    still present as of 09/2017.  High dose vit D started 10/16/17.  Increased dose to 50K twice per week 02/18/18.  Marland Kitchen Hyperlipidemia    Mild, never meds  . Metastatic malignant melanoma ([place]) 1989   Recurrence L upper arm 1993.  Then left back and LUL lung recurrence 1995-resected.  Brain lesion recurrence 1996-resected.  RLL lung lesion 1996--chemo & then resection.  Small bowel recurrence resected 1997, then mesenteric lesion resected 1998.  No sign of recurrence since 1998.  Released by onc/Dr. Magrinot as of 12/2015 (needs annual dermatologist exam and annual ophth exam).  . Prediabetes    A1c 5.9% 12/2014.  6.2% 09/2017.    Past Surgical History:  Procedure Laterality Date  . BRAIN SURGERY     Removal of melanoma met  . COLONOSCOPY  2009; 07/2016   Dr. Wynetta Emery  at [place]; next colonoscopy due 07/2019.  Marland Kitchen COLONOSCOPY WITH PROPOFOL N/A 08/18/2016   Tubular adenoma.  Recall 3 yrs.  Procedure: COLONOSCOPY WITH PROPOFOL;  Surgeon: Garlan Fair, MD;  Location: [facility];  Service: Endoscopy;  Laterality: N/A;  . LUNG SURGERY     "              "          "        "  . SMALL INTESTINE SURGERY     "               "         "        "    Outpatient Medications Prior to Visit  Medication Sig Dispense Refill  . GLUCOSAMINE-CHONDROITIN PO Take 2,000 Units by mouth.    . Vitamin D, Ergocalciferol, (DRISDOL) 50000 units CAPS capsule TAKE ONE CAPSULE BY MOUTH TWICE A WEEK FOR 3 MONTHS. 24 capsule 1   No facility-administered medications prior to visit.    Allergies  Allergen Reactions  . Aspirin Nausea And Vomiting  . Penicillins Other (See Comments)    Childhood  Has patient had a PCN reaction causing immediate rash, facial/tongue/throat swelling, SOB or lightheadedness with hypotension: unknown Has patient had a PCN reaction causing severe rash  involving mucus membranes or skin necrosis: {unkjnown Has patient had a PCN reaction that required hospitalization {no Has patient had a PCN reaction occurring within the last 10 years: no If all of the above answers are "NO", then may proceed with Cephalosporin use.    ROS As per HPI  PE: Vitals with BMI 05/03/2020 05/05/2019 04/20/2018  Height 5' 9"  5' 9"  5' 9"   Weight 204 lbs 6 oz 198 lbs 6 oz 199 lbs  BMI 30.17 94.49 67.59  Systolic 163 846 659  Diastolic 86 78 79  Pulse 63 47 50   Gen: Alert, well appearing.  Patient is oriented to person, place, time, and situation. AFFECT: pleasant, lucid thought and speech. BACK EXAM:  Patient moves about the exam room without particular difficulty or stooped posture. Spine appears straight, back without bruising or other skin abnormality. Pt demonstrates normal ROM of L spine in forward flexion, extension, lateral flexion, and rotation, with no pain  upon ROM except some pain and stiffness with extension. Palpation of back reveals no tenderness in paraspinous soft tissues, facet joint regions, or over spinous processes.  HIP ROM normal, no pain. SLR neg bilaterally.  LE strength 5/5 in proximal and distal muscles bilaterally. DTRs: trace patellar, trace achilles, bilaterally.   Tension on SI joints elicits mild pain on L SI region.    LABS:    Chemistry      Component Value Date/Time   NA 137 05/05/2019 1030   NA 139 12/04/2015 1551   K 4.3 05/05/2019 1030   K 3.9 12/04/2015 1551   CL 102 05/05/2019 1030   CL 104 10/06/2012 1546   CO2 28 05/05/2019 1030   CO2 26 12/04/2015 1551   BUN 14 05/05/2019 1030   BUN 13.8 12/04/2015 1551   CREATININE 0.80 05/05/2019 1030   CREATININE 0.9 12/04/2015 1551      Component Value Date/Time   CALCIUM 8.8 05/05/2019 1030   CALCIUM 8.9 12/04/2015 1551   ALKPHOS 48 05/05/2019 1030   ALKPHOS 45 12/04/2015 1551   AST 15 05/05/2019 1030   AST 17 12/04/2015 1551   ALT 19 05/05/2019 1030   ALT 25 12/04/2015 1551   BILITOT 0.5 05/05/2019 1030   BILITOT 0.43 12/04/2015 1551     Lab Results  Component Value Date   WBC 5.5 05/05/2019   HGB 14.8 05/05/2019   HCT 43.9 05/05/2019   MCV 87.0 05/05/2019   PLT 239.0 05/05/2019   Lab Results  Component Value Date   HGBA1C 6.3 05/05/2019    IMPRESSION AND PLAN:  Musculoskeletal low back sprain/strain. 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 Given his hx of melanoma, will check L spine plain films. Discussed formal PT but he cannot do this b/c of so much time he has to work. Emphasized importance of home rehab exercises, gave printout of these. Recommended aleve 440 mg bid x 10d, then bid prn---take with food. Heat/ice discussed. YOGA encouraged.  An After Visit Summary was printed and given to the patient.  FOLLOW UP: Return in about 6 weeks (around 06/14/2020) for f/u LBP (check A1c as  well?).  Signed:  Crissie Sickles, MD           05/03/2020

## 2020-05-03 NOTE — Patient Instructions (Signed)
Take 2 over the counter aleve tabs twice per day with food for the next 10 days    Low Back Sprain or Strain Rehab Ask your health care provider which exercises are safe for you. Do exercises exactly as told by your health care provider and adjust them as directed. It is normal to feel mild stretching, pulling, tightness, or discomfort as you do these exercises. Stop right away if you feel sudden pain or your pain gets worse. Do not begin these exercises until told by your health care provider. Stretching and range-of-motion exercises These exercises warm up your muscles and joints and improve the movement and flexibility of your back. These exercises also help to relieve pain, numbness, and tingling. Lumbar rotation  1. Lie on your back on a firm surface and bend your knees. 2. Straighten your arms out to your sides so each arm forms a 90-degree angle (right angle) with a side of your body. 3. Slowly move (rotate) both of your knees to one side of your body until you feel a stretch in your lower back (lumbar). Try not to let your shoulders lift off the floor. 4. Hold this position for __________ seconds. 5. Tense your abdominal muscles and slowly move your knees back to the starting position. 6. Repeat this exercise on the other side of your body. Repeat __________ times. Complete this exercise __________ times a day. Single knee to chest  1. Lie on your back on a firm surface with both legs straight. 2. Bend one of your knees. Use your hands to move your knee up toward your chest until you feel a gentle stretch in your lower back and buttock. ? Hold your leg in this position by holding on to the front of your knee. ? Keep your other leg as straight as possible. 3. Hold this position for __________ seconds. 4. Slowly return to the starting position. 5. Repeat with your other leg. Repeat __________ times. Complete this exercise __________ times a day. Prone extension on elbows  1. Lie on  your abdomen on a firm surface (prone position). 2. Prop yourself up on your elbows. 3. Use your arms to help lift your chest up until you feel a gentle stretch in your abdomen and your lower back. ? This will place some of your body weight on your elbows. If this is uncomfortable, try stacking pillows under your chest. ? Your hips should stay down, against the surface that you are lying on. Keep your hip and back muscles relaxed. 4. Hold this position for __________ seconds. 5. Slowly relax your upper body and return to the starting position. Repeat __________ times. Complete this exercise __________ times a day. Strengthening exercises These exercises build strength and endurance in your back. Endurance is the ability to use your muscles for a long time, even after they get tired. Pelvic tilt This exercise strengthens the muscles that lie deep in the abdomen. 1. Lie on your back on a firm surface. Bend your knees and keep your feet flat on the floor. 2. Tense your abdominal muscles. Tip your pelvis up toward the ceiling and flatten your lower back into the floor. ? To help with this exercise, you may place a small towel under your lower back and try to push your back into the towel. 3. Hold this position for __________ seconds. 4. Let your muscles relax completely before you repeat this exercise. Repeat __________ times. Complete this exercise __________ times a day. Alternating arm and leg raises  1. Get on your hands and knees on a firm surface. If you are on a hard floor, you may want to use padding, such as an exercise mat, to cushion your knees. 2. Line up your arms and legs. Your hands should be directly below your shoulders, and your knees should be directly below your hips. 3. Lift your left leg behind you. At the same time, raise your right arm and straighten it in front of you. ? Do not lift your leg higher than your hip. ? Do not lift your arm higher than your shoulder. ? Keep  your abdominal and back muscles tight. ? Keep your hips facing the ground. ? Do not arch your back. ? Keep your balance carefully, and do not hold your breath. 4. Hold this position for __________ seconds. 5. Slowly return to the starting position. 6. Repeat with your right leg and your left arm. Repeat __________ times. Complete this exercise __________ times a day. Abdominal set with straight leg raise  1. Lie on your back on a firm surface. 2. Bend one of your knees and keep your other leg straight. 3. Tense your abdominal muscles and lift your straight leg up, 4-6 inches (10-15 cm) off the ground. 4. Keep your abdominal muscles tight and hold this position for __________ seconds. ? Do not hold your breath. ? Do not arch your back. Keep it flat against the ground. 5. Keep your abdominal muscles tense as you slowly lower your leg back to the starting position. 6. Repeat with your other leg. Repeat __________ times. Complete this exercise __________ times a day. Single leg lower with bent knees 1. Lie on your back on a firm surface. 2. Tense your abdominal muscles and lift your feet off the floor, one foot at a time, so your knees and hips are bent in 90-degree angles (right angles). ? Your knees should be over your hips and your lower legs should be parallel to the floor. 3. Keeping your abdominal muscles tense and your knee bent, slowly lower one of your legs so your toe touches the ground. 4. Lift your leg back up to return to the starting position. ? Do not hold your breath. ? Do not let your back arch. Keep your back flat against the ground. 5. Repeat with your other leg. Repeat __________ times. Complete this exercise __________ times a day. Posture and body mechanics Good posture and healthy body mechanics can help to relieve stress in your body's tissues and joints. Body mechanics refers to the movements and positions of your body while you do your daily activities. Posture is  part of body mechanics. Good posture means:  Your spine is in its natural S-curve position (neutral).  Your shoulders are pulled back slightly.  Your head is not tipped forward. Follow these guidelines to improve your posture and body mechanics in your everyday activities. Standing   When standing, keep your spine neutral and your feet about hip width apart. Keep a slight bend in your knees. Your ears, shoulders, and hips should line up.  When you do a task in which you stand in one place for a long time, place one foot up on a stable object that is 2-4 inches (5-10 cm) high, such as a footstool. This helps keep your spine neutral. Sitting   When sitting, keep your spine neutral and keep your feet flat on the floor. Use a footrest, if necessary, and keep your thighs parallel to the floor. Avoid rounding your  shoulders, and avoid tilting your head forward.  When working at a desk or a computer, keep your desk at a height where your hands are slightly lower than your elbows. Slide your chair under your desk so you are close enough to maintain good posture.  When working at a computer, place your monitor at a height where you are looking straight ahead and you do not have to tilt your head forward or downward to look at the screen. Resting  When lying down and resting, avoid positions that are most painful for you.  If you have pain with activities such as sitting, bending, stooping, or squatting, lie in a position in which your body does not bend very much. For example, avoid curling up on your side with your arms and knees near your chest (fetal position).  If you have pain with activities such as standing for a long time or reaching with your arms, lie with your spine in a neutral position and bend your knees slightly. Try the following positions: ? Lying on your side with a pillow between your knees. ? Lying on your back with a pillow under your knees. Lifting   When lifting  objects, keep your feet at least shoulder width apart and tighten your abdominal muscles.  Bend your knees and hips and keep your spine neutral. It is important to lift using the strength of your legs, not your back. Do not lock your knees straight out.  Always ask for help to lift heavy or awkward objects. This information is not intended to replace advice given to you by your health care provider. Make sure you discuss any questions you have with your health care provider. Document Revised: 10/29/2018 Document Reviewed: 07/29/2018 Elsevier Patient Education  [place].

## 2020-05-04 ENCOUNTER — Ambulatory Visit (HOSPITAL_BASED_OUTPATIENT_CLINIC_OR_DEPARTMENT_OTHER)
Admission: RE | Admit: 2020-05-04 | Discharge: 2020-05-04 | Disposition: A | Discharge status: Home / Self Care | Payer: Managed Care, Other (non HMO) | Source: Ambulatory Visit | Attending: Family Medicine | Admitting: Family Medicine

## 2020-05-04 DIAGNOSIS — M545 Low back pain, unspecified: Secondary | ICD-10-CM | POA: Diagnosis not present

## 2020-05-04 DIAGNOSIS — S39012A Strain of muscle, fascia and tendon of lower back, initial encounter: Secondary | ICD-10-CM | POA: Insufficient documentation

## 2020-06-06 ENCOUNTER — Encounter: Payer: Self-pay | Admitting: Family Medicine

## 2020-06-06 ENCOUNTER — Ambulatory Visit (INDEPENDENT_AMBULATORY_CARE_PROVIDER_SITE_OTHER): Payer: Managed Care, Other (non HMO) | Admitting: Family Medicine

## 2020-06-06 ENCOUNTER — Other Ambulatory Visit: Payer: Self-pay

## 2020-06-06 VITALS — BP 132/76 | HR 64 | Temp 98.1°F | Resp 16 | Ht 69.0 in | Wt 202.8 lb

## 2020-06-06 DIAGNOSIS — M545 Low back pain, unspecified: Secondary | ICD-10-CM | POA: Diagnosis not present

## 2020-06-06 NOTE — Progress Notes (Signed)
OFFICE VISIT  06/06/2020  CC:  Chief Complaint  Patient presents with  . Follow-up    low back pain, says he is making some improvement but has been doing the exercises recommended    HPI:    Patient is a 59 y.o. Caucasian male who presents for 1 mo f/u low back pain. A/P as of last visit: "Musculoskeletal low back sprain/strain. 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 Given his hx of melanoma, will check L spine plain films. Discussed formal PT but he cannot do this b/c of so much time he has to work. Emphasized importance of home rehab exercises, gave printout of these. Recommended aleve 440 mg bid x 10d, then bid prn---take with food. Heat/ice discussed. YOGA encouraged."  INTERIM HX: Back is doing better.  When he has pain it is located all across low back, sometimes more focused in back of R hip/SI jt area.  No radiation, no paresthesias or LE weakness.  Still some stiffness/soreness at times, loosens up with back exercises every time. Working out in gym, lifting wts, working full time for [employer] as usual.  05/04/20 plain films of L/S spine normal except mild degenerative changes and lower lumbar facet arthropathy.   Past Medical History:  Diagnosis Date  . Asymmetrical sensorineural hearing loss    L>>R: ENT eval 04/2018.  Pt declined MRI brain at that time.  Marland Kitchen History of adenomatous polyp of colon 07/2016  . History of vitamin D deficiency    still present as of 09/2017.  High dose vit D started 10/16/17.  Increased dose to 50K twice per week 02/18/18.  Marland Kitchen Hyperlipidemia    Mild, never meds  . Metastatic malignant melanoma ([place]) 1989   Recurrence L upper arm 1993.  Then left back and LUL lung recurrence 1995-resected.  Brain lesion recurrence 1996-resected.  RLL lung lesion 1996--chemo & then resection.  Small bowel recurrence resected 1997, then mesenteric lesion resected 1998.  No sign of recurrence since 1998.  Released by onc/Dr.  Magrinot as of 12/2015 (needs annual dermatologist exam and annual ophth exam).  . Prediabetes    A1c 5.9% 12/2014.  6.2% 09/2017. 6.3% 04/2019.    Past Surgical History:  Procedure Laterality Date  . BRAIN SURGERY     Removal of melanoma met  . COLONOSCOPY  2009; 07/2016   Dr. Wynetta Emery at [place]; next colonoscopy due 07/2019.  Marland Kitchen COLONOSCOPY WITH PROPOFOL N/A 08/18/2016   Tubular adenoma.  Recall 3 yrs.  Procedure: COLONOSCOPY WITH PROPOFOL;  Surgeon: Garlan Fair, MD;  Location: [facility];  Service: Endoscopy;  Laterality: N/A;  . LUNG SURGERY     "              "          "        "  . SMALL INTESTINE SURGERY     "               "         "        "    Outpatient Medications Prior to Visit  Medication Sig Dispense Refill  . GLUCOSAMINE-CHONDROITIN PO Take 2,000 Units by mouth.    . Vitamin D, Ergocalciferol, (DRISDOL) 50000 units CAPS capsule TAKE ONE CAPSULE BY MOUTH TWICE A WEEK FOR 3 MONTHS. 24 capsule 1   No facility-administered medications prior to visit.    Allergies  Allergen Reactions  . Aspirin  Nausea And Vomiting  . Penicillins Other (See Comments)    Childhood  Has patient had a PCN reaction causing immediate rash, facial/tongue/throat swelling, SOB or lightheadedness with hypotension: unknown Has patient had a PCN reaction causing severe rash involving mucus membranes or skin necrosis: {unkjnown Has patient had a PCN reaction that required hospitalization {no Has patient had a PCN reaction occurring within the last 10 years: no If all of the above answers are "NO", then may proceed with Cephalosporin use.    ROS As per HPI  PE: Vitals with BMI 06/06/2020 05/03/2020 05/05/2019  Height 5' 9"  5' 9"  5' 9"   Weight 202 lbs 13 oz 204 lbs 6 oz 198 lbs 6 oz  BMI 29.93 70.96 43.83  Systolic 818 403 754  Diastolic 76 86 78  Pulse 64 63 47     Gen: Alert, well appearing.  Patient is oriented to person, place, time, and situation. AFFECT: pleasant, lucid  thought and speech. No further exam today.  LABS:    Chemistry      Component Value Date/Time   NA 137 05/05/2019 1030   NA 139 12/04/2015 1551   K 4.3 05/05/2019 1030   K 3.9 12/04/2015 1551   CL 102 05/05/2019 1030   CL 104 10/06/2012 1546   CO2 28 05/05/2019 1030   CO2 26 12/04/2015 1551   BUN 14 05/05/2019 1030   BUN 13.8 12/04/2015 1551   CREATININE 0.80 05/05/2019 1030   CREATININE 0.9 12/04/2015 1551      Component Value Date/Time   CALCIUM 8.8 05/05/2019 1030   CALCIUM 8.9 12/04/2015 1551   ALKPHOS 48 05/05/2019 1030   ALKPHOS 45 12/04/2015 1551   AST 15 05/05/2019 1030   AST 17 12/04/2015 1551   ALT 19 05/05/2019 1030   ALT 25 12/04/2015 1551   BILITOT 0.5 05/05/2019 1030   BILITOT 0.43 12/04/2015 1551     Lab Results  Component Value Date   HGBA1C 6.3 05/05/2019   IMPRESSION AND PLAN:  1) Acute bilat LBP w/out radiculopathy. Musculoskeletal. X-rays of L/S spine 1 mo ago reassuring. He is signif improved and will continue regular home back self-PT.  2) Hx of prediabetes->Hba1c 6.35 Oct 2020->needs repeat. We'll get labs at his CPE set for late next month.  An After Visit Summary was printed and given to the patient.  FOLLOW UP: Return for keep appt for CPE already set for 07/16/20.  Signed:  Crissie Sickles, MD           06/06/2020

## 2020-07-16 ENCOUNTER — Other Ambulatory Visit: Payer: Self-pay

## 2020-07-16 ENCOUNTER — Encounter: Payer: Self-pay | Admitting: Family Medicine

## 2020-07-16 ENCOUNTER — Ambulatory Visit (INDEPENDENT_AMBULATORY_CARE_PROVIDER_SITE_OTHER): Payer: Managed Care, Other (non HMO) | Admitting: Family Medicine

## 2020-07-16 VITALS — BP 127/68 | HR 48 | Temp 98.0°F | Resp 16 | Ht 68.5 in | Wt 202.2 lb

## 2020-07-16 DIAGNOSIS — R7303 Prediabetes: Secondary | ICD-10-CM

## 2020-07-16 DIAGNOSIS — E559 Vitamin D deficiency, unspecified: Secondary | ICD-10-CM

## 2020-07-16 DIAGNOSIS — Z125 Encounter for screening for malignant neoplasm of prostate: Secondary | ICD-10-CM | POA: Diagnosis not present

## 2020-07-16 DIAGNOSIS — Z0000 Encounter for general adult medical examination without abnormal findings: Secondary | ICD-10-CM

## 2020-07-16 DIAGNOSIS — E7800 Pure hypercholesterolemia, unspecified: Secondary | ICD-10-CM

## 2020-07-16 LAB — CBC WITH DIFFERENTIAL/PLATELET
Basophils Absolute: 0 10*3/uL (ref 0.0–0.1)
Basophils Relative: 0.6 % (ref 0.0–3.0)
Eosinophils Absolute: 0.1 10*3/uL (ref 0.0–0.7)
Eosinophils Relative: 1.2 % (ref 0.0–5.0)
HCT: 43.1 % (ref 39.0–52.0)
Hemoglobin: 14.7 g/dL (ref 13.0–17.0)
Lymphocytes Relative: 28.4 % (ref 12.0–46.0)
Lymphs Abs: 1.5 10*3/uL (ref 0.7–4.0)
MCHC: 34.2 g/dL (ref 30.0–36.0)
MCV: 86 fl (ref 78.0–100.0)
Monocytes Absolute: 0.4 10*3/uL (ref 0.1–1.0)
Monocytes Relative: 7.9 % (ref 3.0–12.0)
Neutro Abs: 3.3 10*3/uL (ref 1.4–7.7)
Neutrophils Relative %: 61.9 % (ref 43.0–77.0)
Platelets: 234 10*3/uL (ref 150.0–400.0)
RBC: 5.01 Mil/uL (ref 4.22–5.81)
RDW: 13 % (ref 11.5–15.5)
WBC: 5.4 10*3/uL (ref 4.0–10.5)

## 2020-07-16 LAB — LIPID PANEL
Cholesterol: 223 mg/dL — ABNORMAL HIGH (ref 0–200)
HDL: 42.1 mg/dL (ref >39.00)
NonHDL: 180.61
Total CHOL/HDL Ratio: 5
Triglycerides: 294 mg/dL — ABNORMAL HIGH (ref 0.0–149.0)
VLDL: 58.8 mg/dL — ABNORMAL HIGH (ref 0.0–40.0)

## 2020-07-16 LAB — HEMOGLOBIN A1C: Hgb A1c MFr Bld: 6.2 % (ref 4.6–6.5)

## 2020-07-16 LAB — COMPREHENSIVE METABOLIC PANEL
ALT: 22 U/L (ref 0–53)
AST: 15 U/L (ref 0–37)
Albumin: 4.1 g/dL (ref 3.5–5.2)
Alkaline Phosphatase: 46 U/L (ref 39–117)
BUN: 15 mg/dL (ref 6–23)
CO2: 27 mEq/L (ref 19–32)
Calcium: 8.6 mg/dL (ref 8.4–10.5)
Chloride: 103 mEq/L (ref 96–112)
Creatinine, Ser: 0.9 mg/dL (ref 0.40–1.50)
GFR: 93.9 mL/min (ref >60.00)
Glucose, Bld: 114 mg/dL — ABNORMAL HIGH (ref 70–99)
Potassium: 4.2 mEq/L (ref 3.5–5.1)
Sodium: 138 mEq/L (ref 135–145)
Total Bilirubin: 0.4 mg/dL (ref 0.2–1.2)
Total Protein: 6.7 g/dL (ref 6.0–8.3)

## 2020-07-16 LAB — VITAMIN D 25 HYDROXY (VIT D DEFICIENCY, FRACTURES): VITD: 21.55 ng/mL — ABNORMAL LOW (ref 30.00–100.00)

## 2020-07-16 LAB — LDL CHOLESTEROL, DIRECT: Direct LDL: 116 mg/dL

## 2020-07-16 LAB — PSA: PSA: 0.8 ng/mL (ref 0.10–4.00)

## 2020-07-16 LAB — TSH: TSH: 3.09 u[IU]/mL (ref 0.35–4.50)

## 2020-07-16 NOTE — Patient Instructions (Signed)

## 2020-07-16 NOTE — Progress Notes (Signed)
Office Note 07/16/2020  CC:  Chief Complaint  Patient presents with  . Annual Exam    Pt is fasting    HPI:  Sean [name] is a 59 y.o. White male who is here for annual health maintenance exam. Last visit 1 yr ago Vit D low still. Increased dose to THREE of the 1000 U vit D over the counter tabs every day. Lipids a little up but no meds indicated at that time.  Vit D: 2000 U daily supplement.  Exercise: walks regularly for exercise, lifts wts some.  Past Medical History:  Diagnosis Date  . Asymmetrical sensorineural hearing loss    L>>R: ENT eval 04/2018.  Pt declined MRI brain at that time.  Marland Kitchen History of adenomatous polyp of colon 07/2016  . History of vitamin D deficiency    still present as of 09/2017.  High dose vit D started 10/16/17.  Increased dose to 50K twice per week 02/18/18.  Marland Kitchen Hyperlipidemia    Mild, never meds  . Metastatic malignant melanoma ([place]) 1989   Recurrence L upper arm 1993.  Then left back and LUL lung recurrence 1995-resected.  Brain lesion recurrence 1996-resected.  RLL lung lesion 1996--chemo & then resection.  Small bowel recurrence resected 1997, then mesenteric lesion resected 1998.  No sign of recurrence since 1998.  Released by onc/Dr. Magrinot as of 12/2015 (needs annual dermatologist exam and annual ophth exam).  . Prediabetes    A1c 5.9% 12/2014.  6.2% 09/2017. 6.3% 04/2019.    Past Surgical History:  Procedure Laterality Date  . BRAIN SURGERY     Removal of melanoma met  . COLONOSCOPY  2009; 07/2016   Dr. Wynetta Emery at [place]; next colonoscopy due 07/2019.  Marland Kitchen COLONOSCOPY WITH PROPOFOL N/A 08/18/2016   Tubular adenoma.  Recall 3 yrs.  Procedure: COLONOSCOPY WITH PROPOFOL;  Surgeon: Garlan Fair, MD;  Location: [facility];  Service: Endoscopy;  Laterality: N/A;  . LUNG SURGERY     "              "          "        "  . SMALL INTESTINE SURGERY     "               "         "        "    Family History  Problem Relation Age of  Onset  . Cancer Mother        breast  . Alcohol abuse Father     Social History   Socioeconomic History  . Marital status: Married    Spouse name: Not on file  . Number of children: 2  . Years of education: Not on file  . Highest education level: Not on file  Occupational History  . Not on file  Tobacco Use  . Smoking status: Never Smoker  . Smokeless tobacco: Former Network engineer  . Vaping Use: Never used  Substance and Sexual Activity  . Alcohol use: Yes    Comment: occasional  . Drug use: No  . Sexual activity: Yes  Other Topics Concern  . Not on file  Social History Narrative   Married, 2 sons.   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   No T/A/Ds.   Diet ok except fast foods.   Social Determinants of Health   Financial Resource Strain: Not  on file  Food Insecurity: Not on file  Transportation Needs: Not on file  Physical Activity: Not on file  Stress: Not on file  Social Connections: Not on file  Intimate Partner Violence: Not on file    Outpatient Medications Prior to Visit  Medication Sig Dispense Refill  . GLUCOSAMINE-CHONDROITIN PO Take 2,000 Units by mouth.    . Vitamin D, Ergocalciferol, (DRISDOL) 50000 units CAPS capsule TAKE ONE CAPSULE BY MOUTH TWICE A WEEK FOR 3 MONTHS. 24 capsule 1   No facility-administered medications prior to visit.    Allergies  Allergen Reactions  . Aspirin Nausea And Vomiting  . Penicillins Other (See Comments)    Childhood  Has patient had a PCN reaction causing immediate rash, facial/tongue/throat swelling, SOB or lightheadedness with hypotension: unknown Has patient had a PCN reaction causing severe rash involving mucus membranes or skin necrosis: {unkjnown Has patient had a PCN reaction that required hospitalization {no Has patient had a PCN reaction occurring within the last 10 years: no If all of the above answers are "NO", then may proceed with Cephalosporin use.    ROS Review of Systems   Constitutional: Negative for appetite change, chills, fatigue and fever.  HENT: Negative for congestion, dental problem, ear pain and sore throat.   Eyes: Negative for discharge, redness and visual disturbance.  Respiratory: Negative for cough, chest tightness, shortness of breath and wheezing.   Cardiovascular: Negative for chest pain, palpitations and leg swelling.  Gastrointestinal: Negative for abdominal pain, blood in stool, diarrhea, nausea and vomiting.  Genitourinary: Negative for difficulty urinating, dysuria, flank pain, frequency, hematuria and urgency.  Musculoskeletal: Negative for arthralgias, back pain, joint swelling, myalgias and neck stiffness.  Skin: Negative for pallor and rash.  Neurological: Negative for dizziness, speech difficulty, weakness and headaches.  Hematological: Negative for adenopathy. Does not bruise/bleed easily.  Psychiatric/Behavioral: Negative for confusion and sleep disturbance. The patient is not nervous/anxious.     PE; Vitals with BMI 07/16/2020 06/06/2020 05/03/2020  Height 5' 8.5" 5' 9"  5' 9"   Weight 202 lbs 3 oz 202 lbs 13 oz 204 lbs 6 oz  BMI 30.29 85.63 14.97  Systolic 026 378 588  Diastolic 68 76 86  Pulse 48 64 63     Gen: Alert, well appearing.  Patient is oriented to person, place, time, and situation. AFFECT: pleasant, lucid thought and speech. ENT: Ears: EACs clear, normal epithelium.  TMs with good light reflex and landmarks bilaterally.  Eyes: no injection, icteris, swelling, or exudate.  EOMI, PERRLA. Nose: no drainage or turbinate edema/swelling.  No injection or focal lesion.  Mouth: lips without lesion/swelling.  Oral mucosa pink and moist.  Dentition intact and without obvious caries or gingival swelling.  Oropharynx without erythema, exudate, or swelling.  Neck: supple/nontender.  No LAD, mass, or TM.  Carotid pulses 2+ bilaterally, without bruits. CV: RRR, no m/r/g.   LUNGS: CTA bilat, nonlabored resps, good aeration in  all lung fields. ABD: soft, NT, ND, BS normal.  No hepatospenomegaly or mass.  No bruits. EXT: no clubbing, cyanosis, or edema.  Musculoskeletal: no joint swelling, erythema, warmth, or tenderness.  ROM of all joints intact. Skin - no sores or suspicious lesions or rashes or color changes   Pertinent labs:  Lab Results  Component Value Date   TSH 2.57 05/05/2019   Lab Results  Component Value Date   WBC 5.5 05/05/2019   HGB 14.8 05/05/2019   HCT 43.9 05/05/2019   MCV 87.0 05/05/2019   PLT  239.0 05/05/2019   Lab Results  Component Value Date   CREATININE 0.80 05/05/2019   BUN 14 05/05/2019   NA 137 05/05/2019   K 4.3 05/05/2019   CL 102 05/05/2019   CO2 28 05/05/2019   Lab Results  Component Value Date   ALT 19 05/05/2019   AST 15 05/05/2019   ALKPHOS 48 05/05/2019   BILITOT 0.5 05/05/2019   Lab Results  Component Value Date   CHOL 206 (H) 05/05/2019   Lab Results  Component Value Date   HDL 46.80 05/05/2019   Lab Results  Component Value Date   LDLCALC 141 (H) 05/05/2019   Lab Results  Component Value Date   TRIG 93.0 05/05/2019   Lab Results  Component Value Date   CHOLHDL 4 05/05/2019   Lab Results  Component Value Date   PSA 0.68 05/05/2019   PSA 0.74 10/15/2017   PSA 0.68 05/20/2016   Lab Results  Component Value Date   HGBA1C 6.3 05/05/2019   ASSESSMENT AND PLAN:   Health maintenance exam: Reviewed age and gender appropriate health maintenance issues (prudent diet, regular exercise, health risks of tobacco and excessive alcohol, use of seatbelts, fire alarms in home, use of sunscreen).  Also reviewed age and gender appropriate health screening as well as vaccine recommendations. Vaccines: Flu->declined.  Shingrix->declined.  Otherwise ALL UTD. Labs: fasting HP, A1c, vit d, and PSA. Prostate ca screening: PSA today. Colon ca screening: Hx of polyps->repeat due 07/2019->he's getting this set up for this spring.  Vit D def, long hx, taking  2000 U vit D qd supplement. Rechecking vit D level today.  Hx of metastatic melanoma: has annual derm f/u tomorrow.  An After Visit Summary was printed and given to the patient.  FOLLOW UP:  No follow-ups on file.  Signed:  Crissie Sickles, MD           07/16/2020

## 2020-07-17 NOTE — Addendum Note (Signed)
Addended by: Paschal Dopp on: 07/17/2020 11:01 AM   Modules accepted: Orders

## 2020-08-07 ENCOUNTER — Encounter: Payer: Self-pay | Admitting: Family Medicine

## 2020-08-07 ENCOUNTER — Other Ambulatory Visit: Payer: Self-pay

## 2020-08-07 ENCOUNTER — Ambulatory Visit (INDEPENDENT_AMBULATORY_CARE_PROVIDER_SITE_OTHER): Payer: 59 | Admitting: Family Medicine

## 2020-08-07 VITALS — BP 120/70 | HR 71 | Temp 98.2°F | Ht 68.0 in | Wt 206.0 lb

## 2020-08-07 DIAGNOSIS — L255 Unspecified contact dermatitis due to plants, except food: Secondary | ICD-10-CM

## 2020-08-07 MED ORDER — PREDNISONE 20 MG PO TABS: ORAL_TABLET | ORAL | 0 refills | Status: DC

## 2020-08-07 NOTE — Progress Notes (Signed)
Chief Complaint  Patient presents with  . Poison Ivy    Sean [name] is a 60 y.o. male here for a skin complaint.  Duration: 3 days Location: R eye Pruritic? Yes Painful? No Drainage? No New soaps/lotions/topicals/detergents? No Sick contacts? No Other associated symptoms: exposed to poison ivy that was wrapped around a tree that he took.  Therapies tried thus far: nothing  Past Medical History:  Diagnosis Date  . Asymmetrical sensorineural hearing loss    L>>R: ENT eval 04/2018.  Pt declined MRI brain at that time.  Marland Kitchen History of adenomatous polyp of colon 07/2016  . History of vitamin D deficiency    still present as of 09/2017.  High dose vit D started 10/16/17.  Increased dose to 50K twice per week 02/18/18.  Marland Kitchen Hyperlipidemia    Mild, never meds  . Metastatic malignant melanoma ([place]) 1989   Recurrence L upper arm 1993.  Then left back and LUL lung recurrence 1995-resected.  Brain lesion recurrence 1996-resected.  RLL lung lesion 1996--chemo & then resection.  Small bowel recurrence resected 1997, then mesenteric lesion resected 1998.  No sign of recurrence since 1998.  Released by onc/Dr. Magrinot as of 12/2015 (needs annual dermatologist exam and annual ophth exam).  . Prediabetes    A1c 5.9% 12/2014.  6.2% 09/2017. 6.3% 04/2019.    BP 120/70 (BP Location: Left Arm, Patient Position: Sitting, Cuff Size: Normal)   Pulse 71   Temp 98.2 F (36.8 C) (Oral)   Ht 5\' 8"  (1.727 m)   Wt 206 lb (93.4 kg)   SpO2 96%   BMI 31.32 kg/m  Gen: awake, alert, appearing stated age Lungs: No accessory muscle use Skin: Pinkish area in the periorbital region on the right with slight scaling and edema; there is no evidence of fluctuance, drainage, vesicles, excoriation, tenderness to palpation, tenderness with light palpation over the lids. Psych: Age appropriate judgment and insight  Rhus dermatitis - Plan: predniSONE (DELTASONE) 20 MG tablet  Prednisone taper for 3 weeks.  60 mg for a  week, 40 mg for the next week, 20 mg for the final week.  Try not to itch.  If he starts having eye pain, he will let us know. F/u prn. The patient voiced understanding and agreement to the plan.  [place], DO 08/07/20 4:22 PM

## 2020-08-07 NOTE — Patient Instructions (Signed)
Continue warm compresses.   Try not to itch.   Ice/cold pack over area for 10-15 min twice daily.  Let us know if you need anything.

## 2020-09-28 ENCOUNTER — Inpatient Hospital Stay: Admit: 2020-09-28 | Payer: 59 | Admitting: Surgery

## 2020-09-28 ENCOUNTER — Other Ambulatory Visit: Payer: Self-pay

## 2020-09-28 ENCOUNTER — Observation Stay (HOSPITAL_COMMUNITY): Payer: 59 | Admitting: Anesthesiology

## 2020-09-28 ENCOUNTER — Inpatient Hospital Stay (HOSPITAL_BASED_OUTPATIENT_CLINIC_OR_DEPARTMENT_OTHER)
Admission: EM | Admit: 2020-09-28 | Discharge: 2020-10-08 | DRG: 339 | Disposition: A | Discharge status: Home / Self Care | Payer: 59 | Attending: General Surgery | Admitting: General Surgery

## 2020-09-28 ENCOUNTER — Emergency Department (HOSPITAL_BASED_OUTPATIENT_CLINIC_OR_DEPARTMENT_OTHER): Payer: 59

## 2020-09-28 ENCOUNTER — Encounter (HOSPITAL_COMMUNITY): Admission: EM | Disposition: A | Discharge status: Home / Self Care | Payer: Self-pay | Source: Home / Self Care

## 2020-09-28 ENCOUNTER — Encounter (HOSPITAL_BASED_OUTPATIENT_CLINIC_OR_DEPARTMENT_OTHER): Payer: Self-pay | Admitting: Emergency Medicine

## 2020-09-28 DIAGNOSIS — K37 Unspecified appendicitis: Secondary | ICD-10-CM | POA: Diagnosis present

## 2020-09-28 DIAGNOSIS — Z87891 Personal history of nicotine dependence: Secondary | ICD-10-CM | POA: Diagnosis not present

## 2020-09-28 DIAGNOSIS — Z803 Family history of malignant neoplasm of breast: Secondary | ICD-10-CM | POA: Diagnosis not present

## 2020-09-28 DIAGNOSIS — E785 Hyperlipidemia, unspecified: Secondary | ICD-10-CM | POA: Diagnosis present

## 2020-09-28 DIAGNOSIS — K3533 Acute appendicitis with perforation and localized peritonitis, with abscess: Secondary | ICD-10-CM | POA: Diagnosis present

## 2020-09-28 DIAGNOSIS — C439 Malignant melanoma of skin, unspecified: Secondary | ICD-10-CM | POA: Diagnosis present

## 2020-09-28 DIAGNOSIS — K567 Ileus, unspecified: Secondary | ICD-10-CM | POA: Diagnosis not present

## 2020-09-28 DIAGNOSIS — Z86010 Personal history of colonic polyps: Secondary | ICD-10-CM | POA: Diagnosis not present

## 2020-09-28 DIAGNOSIS — K35891 Other acute appendicitis without perforation, with gangrene: Secondary | ICD-10-CM | POA: Diagnosis present

## 2020-09-28 DIAGNOSIS — K3580 Unspecified acute appendicitis: Secondary | ICD-10-CM | POA: Diagnosis present

## 2020-09-28 DIAGNOSIS — C799 Secondary malignant neoplasm of unspecified site: Secondary | ICD-10-CM | POA: Diagnosis present

## 2020-09-28 DIAGNOSIS — Z20822 Contact with and (suspected) exposure to covid-19: Secondary | ICD-10-CM | POA: Diagnosis present

## 2020-09-28 DIAGNOSIS — Z886 Allergy status to analgesic agent status: Secondary | ICD-10-CM | POA: Diagnosis not present

## 2020-09-28 DIAGNOSIS — R7303 Prediabetes: Secondary | ICD-10-CM | POA: Diagnosis present

## 2020-09-28 DIAGNOSIS — C7931 Secondary malignant neoplasm of brain: Secondary | ICD-10-CM | POA: Diagnosis present

## 2020-09-28 DIAGNOSIS — Z902 Acquired absence of lung [part of]: Secondary | ICD-10-CM

## 2020-09-28 DIAGNOSIS — K3532 Acute appendicitis with perforation and localized peritonitis, without abscess: Secondary | ICD-10-CM | POA: Diagnosis present

## 2020-09-28 DIAGNOSIS — Z8589 Personal history of malignant neoplasm of other organs and systems: Secondary | ICD-10-CM | POA: Diagnosis not present

## 2020-09-28 DIAGNOSIS — R188 Other ascites: Secondary | ICD-10-CM

## 2020-09-28 DIAGNOSIS — R52 Pain, unspecified: Secondary | ICD-10-CM

## 2020-09-28 DIAGNOSIS — Z860101 Personal history of adenomatous and serrated colon polyps: Secondary | ICD-10-CM

## 2020-09-28 DIAGNOSIS — Z880 Allergy status to penicillin: Secondary | ICD-10-CM | POA: Diagnosis not present

## 2020-09-28 DIAGNOSIS — Z85820 Personal history of malignant melanoma of skin: Secondary | ICD-10-CM | POA: Diagnosis not present

## 2020-09-28 DIAGNOSIS — H905 Unspecified sensorineural hearing loss: Secondary | ICD-10-CM | POA: Diagnosis present

## 2020-09-28 DIAGNOSIS — H903 Sensorineural hearing loss, bilateral: Secondary | ICD-10-CM | POA: Diagnosis present

## 2020-09-28 DIAGNOSIS — C181 Malignant neoplasm of appendix: Secondary | ICD-10-CM | POA: Diagnosis present

## 2020-09-28 DIAGNOSIS — Z0189 Encounter for other specified special examinations: Secondary | ICD-10-CM

## 2020-09-28 HISTORY — PX: LAPAROSCOPIC APPENDECTOMY: SHX408

## 2020-09-28 LAB — CBC WITH DIFFERENTIAL/PLATELET
Abs Immature Granulocytes: 0.05 10*3/uL (ref 0.00–0.07)
Basophils Absolute: 0 10*3/uL (ref 0.0–0.1)
Basophils Relative: 0 %
Eosinophils Absolute: 0 10*3/uL (ref 0.0–0.5)
Eosinophils Relative: 0 %
HCT: 46.1 % (ref 39.0–52.0)
Hemoglobin: 15.5 g/dL (ref 13.0–17.0)
Immature Granulocytes: 0 %
Lymphocytes Relative: 3 %
Lymphs Abs: 0.6 10*3/uL — ABNORMAL LOW (ref 0.7–4.0)
MCH: 29.1 pg (ref 26.0–34.0)
MCHC: 33.6 g/dL (ref 30.0–36.0)
MCV: 86.5 fL (ref 80.0–100.0)
Monocytes Absolute: 0.8 10*3/uL (ref 0.1–1.0)
Monocytes Relative: 4 %
Neutro Abs: 17.5 10*3/uL — ABNORMAL HIGH (ref 1.7–7.7)
Neutrophils Relative %: 93 %
Platelets: 252 10*3/uL (ref 150–400)
RBC: 5.33 MIL/uL (ref 4.22–5.81)
RDW: 12.5 % (ref 11.5–15.5)
WBC: 19 10*3/uL — ABNORMAL HIGH (ref 4.0–10.5)
nRBC: 0 % (ref 0.0–0.2)

## 2020-09-28 LAB — LIPASE, BLOOD: Lipase: 39 U/L (ref 11–51)

## 2020-09-28 LAB — COMPREHENSIVE METABOLIC PANEL
ALT: 27 U/L (ref 0–44)
AST: 23 U/L (ref 15–41)
Albumin: 4.5 g/dL (ref 3.5–5.0)
Alkaline Phosphatase: 41 U/L (ref 38–126)
Anion gap: 11 (ref 5–15)
BUN: 14 mg/dL (ref 6–20)
CO2: 25 mmol/L (ref 22–32)
Calcium: 9 mg/dL (ref 8.9–10.3)
Chloride: 100 mmol/L (ref 98–111)
Creatinine, Ser: 0.92 mg/dL (ref 0.61–1.24)
GFR, Estimated: >60 mL/min (ref >60)
Glucose, Bld: 147 mg/dL — ABNORMAL HIGH (ref 70–99)
Potassium: 3.9 mmol/L (ref 3.5–5.1)
Sodium: 136 mmol/L (ref 135–145)
Total Bilirubin: 1.1 mg/dL (ref 0.3–1.2)
Total Protein: 7.7 g/dL (ref 6.5–8.1)

## 2020-09-28 LAB — SURGICAL PCR SCREEN
MRSA, PCR: NEGATIVE
Staphylococcus aureus: NEGATIVE

## 2020-09-28 LAB — SARS CORONAVIRUS 2 BY RT PCR (HOSPITAL ORDER, PERFORMED IN ~~LOC~~ HOSPITAL LAB): SARS Coronavirus 2: NEGATIVE

## 2020-09-28 SURGERY — APPENDECTOMY, LAPAROSCOPIC
Anesthesia: General | Site: Abdomen

## 2020-09-28 MED ORDER — MELATONIN 3 MG PO TABS: 3.0000 mg | ORAL_TABLET | Freq: Every evening | ORAL | Status: DC | PRN

## 2020-09-28 MED ORDER — FENTANYL CITRATE (PF) 250 MCG/5ML IJ SOLN
INTRAMUSCULAR | Status: AC
  Filled 2020-09-28: qty 5

## 2020-09-28 MED ORDER — ONDANSETRON HCL 4 MG/2ML IJ SOLN
4.0000 mg | Freq: Four times a day (QID) | INTRAMUSCULAR | Status: DC | PRN
  Administered 2020-09-29: 4 mg via INTRAVENOUS
  Filled 2020-09-28: qty 2

## 2020-09-28 MED ORDER — IOHEXOL 300 MG/ML  SOLN
100.0000 mL | Freq: Once | INTRAMUSCULAR | Status: AC | PRN
  Administered 2020-09-28: 100 mL via INTRAVENOUS

## 2020-09-28 MED ORDER — LACTATED RINGERS IV SOLN: INTRAVENOUS | Status: DC

## 2020-09-28 MED ORDER — BUPIVACAINE-EPINEPHRINE (PF) 0.25% -1:200000 IJ SOLN
INTRAMUSCULAR | Status: DC | PRN
  Administered 2020-09-28: 7 mL via PERINEURAL

## 2020-09-28 MED ORDER — DIPHENHYDRAMINE HCL 25 MG PO CAPS: 25.0000 mg | ORAL_CAPSULE | Freq: Four times a day (QID) | ORAL | Status: DC | PRN

## 2020-09-28 MED ORDER — LEVOFLOXACIN IN D5W 750 MG/150ML IV SOLN
750.0000 mg | INTRAVENOUS | Status: DC
  Administered 2020-09-28: 750 mg via INTRAVENOUS
  Filled 2020-09-28: qty 150

## 2020-09-28 MED ORDER — LIDOCAINE 2% (20 MG/ML) 5 ML SYRINGE
INTRAMUSCULAR | Status: DC | PRN
  Administered 2020-09-28: 100 mg via INTRAVENOUS

## 2020-09-28 MED ORDER — METRONIDAZOLE IN NACL 5-0.79 MG/ML-% IV SOLN
500.0000 mg | Freq: Once | INTRAVENOUS | Status: AC
  Administered 2020-09-28: 500 mg via INTRAVENOUS
  Filled 2020-09-28: qty 100

## 2020-09-28 MED ORDER — HYDRALAZINE HCL 10 MG PO TABS
10.0000 mg | ORAL_TABLET | Freq: Four times a day (QID) | ORAL | Status: DC | PRN
  Filled 2020-09-28: qty 1

## 2020-09-28 MED ORDER — ACETAMINOPHEN 500 MG PO TABS: 1000.0000 mg | ORAL_TABLET | Freq: Four times a day (QID) | ORAL | Status: DC

## 2020-09-28 MED ORDER — GLYCOPYRROLATE PF 0.2 MG/ML IJ SOSY
PREFILLED_SYRINGE | INTRAMUSCULAR | Status: AC
  Filled 2020-09-28: qty 1

## 2020-09-28 MED ORDER — BUPIVACAINE-EPINEPHRINE 0.25% -1:200000 IJ SOLN
INTRAMUSCULAR | Status: AC
  Filled 2020-09-28: qty 1

## 2020-09-28 MED ORDER — HYDROMORPHONE HCL 1 MG/ML IJ SOLN
INTRAMUSCULAR | Status: AC
  Filled 2020-09-28: qty 1

## 2020-09-28 MED ORDER — OXYCODONE-ACETAMINOPHEN 5-325 MG PO TABS
1.0000 | ORAL_TABLET | ORAL | Status: DC | PRN
  Administered 2020-09-29: 2 via ORAL
  Filled 2020-09-28: qty 2

## 2020-09-28 MED ORDER — SUGAMMADEX SODIUM 200 MG/2ML IV SOLN
INTRAVENOUS | Status: DC | PRN
  Administered 2020-09-28: 200 mg via INTRAVENOUS

## 2020-09-28 MED ORDER — METOPROLOL TARTRATE 5 MG/5ML IV SOLN
5.0000 mg | Freq: Four times a day (QID) | INTRAVENOUS | Status: DC | PRN
Start: 2020-09-28 — End: 2020-09-28
  Filled 2020-09-28: qty 5

## 2020-09-28 MED ORDER — ONDANSETRON 4 MG PO TBDP: 4.0000 mg | ORAL_TABLET | Freq: Four times a day (QID) | ORAL | Status: DC | PRN

## 2020-09-28 MED ORDER — MORPHINE SULFATE (PF) 4 MG/ML IV SOLN
4.0000 mg | Freq: Once | INTRAVENOUS | Status: AC
  Administered 2020-09-28: 4 mg via INTRAVENOUS
  Filled 2020-09-28: qty 1

## 2020-09-28 MED ORDER — LACTATED RINGERS IV SOLN
INTRAVENOUS | Status: DC | PRN
  Administered 2020-09-28 (×2): 1000 mL

## 2020-09-28 MED ORDER — HYDROMORPHONE HCL 1 MG/ML IJ SOLN: 1.0000 mg | Freq: Once | INTRAMUSCULAR | Status: DC

## 2020-09-28 MED ORDER — ACETAMINOPHEN 650 MG RE SUPP: 650.0000 mg | Freq: Four times a day (QID) | RECTAL | Status: DC | PRN

## 2020-09-28 MED ORDER — OXYCODONE HCL 5 MG PO TABS: 5.0000 mg | ORAL_TABLET | ORAL | Status: DC | PRN

## 2020-09-28 MED ORDER — MIDAZOLAM HCL 2 MG/2ML IJ SOLN
INTRAMUSCULAR | Status: DC | PRN
  Administered 2020-09-28: 2 mg via INTRAVENOUS

## 2020-09-28 MED ORDER — MIDAZOLAM HCL 2 MG/2ML IJ SOLN
INTRAMUSCULAR | Status: AC
  Filled 2020-09-28: qty 2

## 2020-09-28 MED ORDER — ROCURONIUM BROMIDE 10 MG/ML (PF) SYRINGE
PREFILLED_SYRINGE | INTRAVENOUS | Status: DC | PRN
  Administered 2020-09-28: 45 mg via INTRAVENOUS
  Administered 2020-09-28: 10 mg via INTRAVENOUS
  Administered 2020-09-28: 5 mg via INTRAVENOUS

## 2020-09-28 MED ORDER — MORPHINE SULFATE (PF) 4 MG/ML IV SOLN
4.0000 mg | Freq: Once | INTRAVENOUS | Status: AC
Start: 2020-09-28 — End: 2020-09-28
  Administered 2020-09-28: 4 mg via INTRAVENOUS
  Filled 2020-09-28: qty 1

## 2020-09-28 MED ORDER — METHOCARBAMOL 500 MG PO TABS
500.0000 mg | ORAL_TABLET | Freq: Four times a day (QID) | ORAL | Status: DC | PRN
Start: 2020-09-28 — End: 2020-10-03
  Administered 2020-10-01 – 2020-10-02 (×2): 500 mg via ORAL
  Filled 2020-09-28 (×2): qty 1

## 2020-09-28 MED ORDER — FENTANYL CITRATE (PF) 250 MCG/5ML IJ SOLN
INTRAMUSCULAR | Status: DC | PRN
  Administered 2020-09-28 (×3): 50 ug via INTRAVENOUS
  Administered 2020-09-28: 100 ug via INTRAVENOUS
  Administered 2020-09-28: 50 ug via INTRAVENOUS

## 2020-09-28 MED ORDER — PROPOFOL 10 MG/ML IV BOLUS
INTRAVENOUS | Status: AC
  Filled 2020-09-28: qty 20

## 2020-09-28 MED ORDER — SODIUM CHLORIDE 0.9 % IV SOLN
1.0000 g | Freq: Three times a day (TID) | INTRAVENOUS | Status: AC
  Administered 2020-09-29 – 2020-10-04 (×17): 1 g via INTRAVENOUS
  Filled 2020-09-28 (×17): qty 1

## 2020-09-28 MED ORDER — KCL IN DEXTROSE-NACL 20-5-0.45 MEQ/L-%-% IV SOLN: INTRAVENOUS | Status: DC

## 2020-09-28 MED ORDER — ACETAMINOPHEN 10 MG/ML IV SOLN
INTRAVENOUS | Status: DC | PRN
  Administered 2020-09-28: 1000 mg via INTRAVENOUS

## 2020-09-28 MED ORDER — ENOXAPARIN SODIUM 40 MG/0.4ML ~~LOC~~ SOLN
40.0000 mg | SUBCUTANEOUS | Status: DC
  Administered 2020-09-29: 09:00:00 40 mg via SUBCUTANEOUS
  Filled 2020-09-28 (×6): qty 0.4

## 2020-09-28 MED ORDER — ACETAMINOPHEN 325 MG PO TABS: 650.0000 mg | ORAL_TABLET | Freq: Four times a day (QID) | ORAL | Status: DC | PRN

## 2020-09-28 MED ORDER — SODIUM CHLORIDE 0.9 % IV BOLUS
500.0000 mL | Freq: Once | INTRAVENOUS | Status: AC
  Administered 2020-09-28: 500 mL via INTRAVENOUS

## 2020-09-28 MED ORDER — PROPOFOL 10 MG/ML IV BOLUS
INTRAVENOUS | Status: DC | PRN
  Administered 2020-09-28: 160 mg via INTRAVENOUS

## 2020-09-28 MED ORDER — ENOXAPARIN SODIUM 40 MG/0.4ML ~~LOC~~ SOLN: 40.0000 mg | SUBCUTANEOUS | Status: DC

## 2020-09-28 MED ORDER — HYDROMORPHONE HCL 1 MG/ML IJ SOLN
1.0000 mg | INTRAMUSCULAR | Status: DC | PRN
  Administered 2020-09-29 (×2): 1 mg via INTRAVENOUS
  Filled 2020-09-28 (×2): qty 1

## 2020-09-28 MED ORDER — HYDROMORPHONE HCL 1 MG/ML IJ SOLN
0.2500 mg | INTRAMUSCULAR | Status: DC | PRN
  Administered 2020-09-28: 0.5 mg via INTRAVENOUS

## 2020-09-28 MED ORDER — FENTANYL CITRATE (PF) 100 MCG/2ML IJ SOLN
50.0000 ug | INTRAMUSCULAR | Status: AC
  Administered 2020-09-28 (×2): 50 ug via INTRAVENOUS
  Filled 2020-09-28: qty 2

## 2020-09-28 MED ORDER — DEXTROSE-NACL 5-0.9 % IV SOLN: INTRAVENOUS | Status: DC

## 2020-09-28 MED ORDER — DOCUSATE SODIUM 100 MG PO CAPS: 100.0000 mg | ORAL_CAPSULE | Freq: Two times a day (BID) | ORAL | Status: DC

## 2020-09-28 MED ORDER — CHLORHEXIDINE GLUCONATE 0.12 % MT SOLN
15.0000 mL | OROMUCOSAL | Status: AC
  Administered 2020-09-28: 15 mL via OROMUCOSAL

## 2020-09-28 MED ORDER — SIMETHICONE 80 MG PO CHEW
40.0000 mg | CHEWABLE_TABLET | Freq: Four times a day (QID) | ORAL | Status: DC | PRN
  Administered 2020-09-30: 40 mg via ORAL
  Filled 2020-09-28: qty 1

## 2020-09-28 MED ORDER — ONDANSETRON HCL 4 MG/2ML IJ SOLN
INTRAMUSCULAR | Status: AC
  Filled 2020-09-28: qty 2

## 2020-09-28 MED ORDER — DEXAMETHASONE SODIUM PHOSPHATE 10 MG/ML IJ SOLN
INTRAMUSCULAR | Status: DC | PRN
  Administered 2020-09-28: 8 mg via INTRAVENOUS

## 2020-09-28 MED ORDER — ONDANSETRON HCL 4 MG/2ML IJ SOLN: 4.0000 mg | Freq: Four times a day (QID) | INTRAMUSCULAR | Status: DC | PRN

## 2020-09-28 MED ORDER — ACETAMINOPHEN 10 MG/ML IV SOLN
INTRAVENOUS | Status: AC
  Filled 2020-09-28: qty 100

## 2020-09-28 MED ORDER — PHENYLEPHRINE 40 MCG/ML (10ML) SYRINGE FOR IV PUSH (FOR BLOOD PRESSURE SUPPORT)
PREFILLED_SYRINGE | INTRAVENOUS | Status: AC
  Filled 2020-09-28: qty 10

## 2020-09-28 MED ORDER — GLYCOPYRROLATE 0.2 MG/ML IJ SOLN
INTRAMUSCULAR | Status: DC | PRN
  Administered 2020-09-28: .1 mg via INTRAVENOUS

## 2020-09-28 MED ORDER — PHENYLEPHRINE HCL (PRESSORS) 10 MG/ML IV SOLN
INTRAVENOUS | Status: DC | PRN
  Administered 2020-09-28 (×2): 120 ug via INTRAVENOUS
  Administered 2020-09-28: 80 ug via INTRAVENOUS

## 2020-09-28 MED ORDER — MEPERIDINE HCL 50 MG/ML IJ SOLN: 6.2500 mg | INTRAMUSCULAR | Status: DC | PRN

## 2020-09-28 MED ORDER — ONDANSETRON HCL 4 MG/2ML IJ SOLN
INTRAMUSCULAR | Status: DC | PRN
  Administered 2020-09-28: 4 mg via INTRAVENOUS

## 2020-09-28 MED ORDER — SUCCINYLCHOLINE CHLORIDE 200 MG/10ML IV SOSY
PREFILLED_SYRINGE | INTRAVENOUS | Status: DC | PRN
  Administered 2020-09-28: 140 mg via INTRAVENOUS

## 2020-09-28 MED ORDER — DIPHENHYDRAMINE HCL 50 MG/ML IJ SOLN
25.0000 mg | Freq: Four times a day (QID) | INTRAMUSCULAR | Status: DC | PRN
Start: 2020-09-28 — End: 2020-09-28

## 2020-09-28 MED ORDER — ZOLPIDEM TARTRATE 5 MG PO TABS: 5.0000 mg | ORAL_TABLET | Freq: Every evening | ORAL | Status: DC | PRN

## 2020-09-28 MED ORDER — DEXAMETHASONE SODIUM PHOSPHATE 10 MG/ML IJ SOLN
INTRAMUSCULAR | Status: AC
  Filled 2020-09-28: qty 1

## 2020-09-28 MED ORDER — MORPHINE SULFATE (PF) 2 MG/ML IV SOLN
1.0000 mg | INTRAVENOUS | Status: DC | PRN
Start: 2020-09-28 — End: 2020-09-28
  Administered 2020-09-28: 2 mg via INTRAVENOUS
  Filled 2020-09-28: qty 1

## 2020-09-28 MED ORDER — AMISULPRIDE (ANTIEMETIC) 5 MG/2ML IV SOLN: 10.0000 mg | Freq: Once | INTRAVENOUS | Status: DC | PRN

## 2020-09-28 MED ORDER — SIMETHICONE 80 MG PO CHEW: 40.0000 mg | CHEWABLE_TABLET | Freq: Four times a day (QID) | ORAL | Status: DC | PRN

## 2020-09-28 SURGICAL SUPPLY — 49 items
ADH SKN CLS APL DERMABOND .7 (GAUZE/BANDAGES/DRESSINGS) ×1
APL PRP STRL LF DISP 70% ISPRP (MISCELLANEOUS) ×1
APPLIER CLIP ROT 10 11.4 M/L (STAPLE)
APR CLP MED LRG 11.4X10 (STAPLE)
BAG SPEC RTRVL 10 TROC 200 (ENDOMECHANICALS)
BAG SPEC RTRVL LRG 6X4 10 (ENDOMECHANICALS) ×1
CABLE HIGH FREQUENCY MONO STRZ (ELECTRODE) IMPLANT
CHLORAPREP W/TINT 26 (MISCELLANEOUS) ×2 IMPLANT
CLIP APPLIE ROT 10 11.4 M/L (STAPLE) IMPLANT
COVER SURGICAL LIGHT HANDLE (MISCELLANEOUS) ×2 IMPLANT
COVER WAND RF STERILE (DRAPES) IMPLANT
CUTTER FLEX LINEAR 45M (STAPLE) ×2 IMPLANT
DECANTER SPIKE VIAL GLASS SM (MISCELLANEOUS) ×1 IMPLANT
DERMABOND ADVANCED (GAUZE/BANDAGES/DRESSINGS) ×1
DERMABOND ADVANCED .7 DNX12 (GAUZE/BANDAGES/DRESSINGS) ×1 IMPLANT
DRAPE LAPAROSCOPIC ABDOMINAL (DRAPES) IMPLANT
ELECT REM PT RETURN 15FT ADLT (MISCELLANEOUS) ×2 IMPLANT
ENDOLOOP SUT PDS II  0 18 (SUTURE)
ENDOLOOP SUT PDS II 0 18 (SUTURE) IMPLANT
GLOVE BIOGEL M 8.0 STRL (GLOVE) ×2 IMPLANT
GOWN STRL REUS W/TWL XL LVL3 (GOWN DISPOSABLE) ×2 IMPLANT
GRASPER SUT TROCAR 14GX15 (MISCELLANEOUS) ×1 IMPLANT
HEMOSTAT SNOW SURGICEL 2X4 (HEMOSTASIS) ×2 IMPLANT
KIT BASIN OR (CUSTOM PROCEDURE TRAY) ×2 IMPLANT
KIT TURNOVER KIT A (KITS) ×2 IMPLANT
PAD POSITIONING PINK XL (MISCELLANEOUS) ×2 IMPLANT
PENCIL SMOKE EVACUATOR (MISCELLANEOUS) IMPLANT
POUCH RETRIEVAL ECOSAC 10 (ENDOMECHANICALS) IMPLANT
POUCH RETRIEVAL ECOSAC 10MM (ENDOMECHANICALS)
POUCH SPECIMEN RETRIEVAL 10MM (ENDOMECHANICALS) ×2 IMPLANT
RELOAD 45 VASCULAR/THIN (ENDOMECHANICALS) IMPLANT
RELOAD STAPLE 45 2.5 WHT GRN (ENDOMECHANICALS) IMPLANT
RELOAD STAPLE 45 3.5 BLU ETS (ENDOMECHANICALS) IMPLANT
RELOAD STAPLE TA45 3.5 REG BLU (ENDOMECHANICALS) ×2 IMPLANT
SCISSORS LAP 5X45 EPIX DISP (ENDOMECHANICALS) ×2 IMPLANT
SET IRRIG TUBING LAPAROSCOPIC (IRRIGATION / IRRIGATOR) ×2 IMPLANT
SET TUBE SMOKE EVAC HIGH FLOW (TUBING) ×2 IMPLANT
SHEARS HARMONIC ACE PLUS 45CM (MISCELLANEOUS) ×2 IMPLANT
SLEEVE XCEL OPT CAN 5 100 (ENDOMECHANICALS) ×2 IMPLANT
STAPLER VISISTAT 35W (STAPLE) IMPLANT
SUT MNCRL AB 4-0 PS2 18 (SUTURE) ×2 IMPLANT
SUT VICRYL 0 UR6 27IN ABS (SUTURE) ×1 IMPLANT
TOWEL OR 17X26 10 PK STRL BLUE (TOWEL DISPOSABLE) ×2 IMPLANT
TRAY FOLEY MTR SLVR 14FR STAT (SET/KITS/TRAYS/PACK) IMPLANT
TRAY FOLEY MTR SLVR 16FR STAT (SET/KITS/TRAYS/PACK) IMPLANT
TRAY LAPAROSCOPIC (CUSTOM PROCEDURE TRAY) ×2 IMPLANT
TROCAR BLADELESS OPT 5 100 (ENDOMECHANICALS) ×3 IMPLANT
TROCAR XCEL BLUNT TIP 100MML (ENDOMECHANICALS) ×2 IMPLANT
TROCAR XCEL NON-BLD 11X100MML (ENDOMECHANICALS) IMPLANT

## 2020-09-28 NOTE — ED Notes (Signed)
Returned from CT , states pain beginning to return

## 2020-09-28 NOTE — ED Notes (Signed)
Report provided to Short Stay , Alexa RN, reported pt to be admitted to inpatient bed, surgical procedure expected to be at 4pm.

## 2020-09-28 NOTE — Anesthesia Preprocedure Evaluation (Addendum)
Anesthesia Evaluation  Patient identified by MRN, date of birth, ID band Patient awake    Reviewed: Allergy & Precautions, NPO status , Patient's Chart, lab work & pertinent test results  Airway Mallampati: II  TM Distance: >3 FB Neck ROM: Full    Dental no notable dental hx. (+) Dental Advisory Given, Teeth Intact   Pulmonary neg pulmonary ROS,    Pulmonary exam normal breath sounds clear to auscultation       Cardiovascular negative cardio ROS Normal cardiovascular exam Rhythm:Regular Rate:Normal     Neuro/Psych negative neurological ROS     GI/Hepatic negative GI ROS, Neg liver ROS,   Endo/Other  negative endocrine ROS  Renal/GU negative Renal ROS     Musculoskeletal negative musculoskeletal ROS (+)   Abdominal   Peds  Hematology negative hematology ROS (+)   Anesthesia Other Findings   Reproductive/Obstetrics                            Anesthesia Physical Anesthesia Plan  ASA: II  Anesthesia Plan: General   Post-op Pain Management:    Induction: Intravenous, Rapid sequence and Cricoid pressure planned  PONV Risk Score and Plan: 4 or greater and Ondansetron, Dexamethasone, Midazolam, Treatment may vary due to age or medical condition and Diphenhydramine  Airway Management Planned: Oral ETT  Additional Equipment: None  Intra-op Plan:   Post-operative Plan: Extubation in OR  Informed Consent: I have reviewed the patients History and Physical, chart, labs and discussed the procedure including the risks, benefits and alternatives for the proposed anesthesia with the patient or authorized representative who has indicated his/her understanding and acceptance.     Dental advisory given  Plan Discussed with: CRNA  Anesthesia Plan Comments:       Anesthesia Quick Evaluation

## 2020-09-28 NOTE — ED Notes (Signed)
Patient transported to CT 

## 2020-09-28 NOTE — Transfer of Care (Signed)
Immediate Anesthesia Transfer of Care Note  Patient: Sean [name]  Procedure(s) Performed: APPENDECTOMY LAPAROSCOPIC (N/A Abdomen)  Patient Location: PACU  Anesthesia Type:General  Level of Consciousness: awake, alert , oriented and patient cooperative  Airway & Oxygen Therapy: Patient Spontanous Breathing and Patient connected to face mask oxygen  Post-op Assessment: Report given to RN, Post -op Vital signs reviewed and stable and Patient moving all extremities X 4  Post vital signs: stable  Last Vitals:  Vitals Value Taken Time  BP 120/74 09/28/20 2304  Temp    Pulse 59 09/28/20 2307  Resp 18 09/28/20 2307  SpO2 98 % 09/28/20 2307  Vitals shown include unvalidated device data.  Last Pain:  Vitals:   09/28/20 1845  TempSrc:   PainSc: 10-Worst pain ever      Patients Stated Pain Goal: 10 (58/25/18 9842)  Complications: No complications documented.

## 2020-09-28 NOTE — Anesthesia Procedure Notes (Signed)
Procedure Name: Intubation Date/Time: 09/28/2020 9:07 PM Performed by: Lissa Morales, CRNA Pre-anesthesia Checklist: Patient identified, Emergency Drugs available, Suction available and Patient being monitored Patient Re-evaluated:Patient Re-evaluated prior to induction Oxygen Delivery Method: Circle system utilized Preoxygenation: Pre-oxygenation with 100% oxygen Induction Type: IV induction, Rapid sequence and Cricoid Pressure applied Ventilation: Mask ventilation without difficulty Laryngoscope Size: Mac and 4 Tube type: Oral Tube size: 7.5 mm Number of attempts: 1 Airway Equipment and Method: Stylet and Oral airway Placement Confirmation: ETT inserted through vocal cords under direct vision,  positive ETCO2 and breath sounds checked- equal and bilateral Secured at: 22 cm Tube secured with: Tape Dental Injury: Teeth and Oropharynx as per pre-operative assessment

## 2020-09-28 NOTE — H&P (Signed)
Sean [name] [birth year]  [id number].    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 He reports acute onset generalized abdominal pain that woke him up from his sleep at 10 PM. Pain became gradually worse and localized to his lower abdomen. Pain is worse with movement and deep inspiration. Associated with decreased appetite, abdominal distention, and decreased frequency of BMs. At baseline he has 2-3 BMs daily. He reports drug allergies as a child to ASA (GI upset) and PCN (hives). Denies use of blood thinning medications and his only daily medication is a Vit D tablet. He states that he developed poison ivy about a month ago and was treated with a 3-week course of prednisone but denies current steroid use. He lives at home alone with his dog and works for [employer] performing heavy lifting and a lot of activity. Past abd surgeries include small bowel resections in 1997 and 1998 by Dr. Rise Patience due to SBO related to melanoma.   ROS -  He denies any fevers, chills, dysuria, nausea, vomiting, chest pain, cough, SOB,   He presented to the [facility] ED for evaluation of his abdominal symptoms and had a WBC of 19K as well as a CT scan that reveals acute uncomplicated appendicitis.  We have been asked to see him for admission and further evaluation and management.  ROS: ROS: Please see HPI, otherwise all other systems have been reviewed and are negative except some recent lower back pain.  Family History  Problem Relation Age of Onset  . Cancer Mother        breast  . Alcohol abuse Father     Past Medical History:  Diagnosis Date  . Asymmetrical sensorineural hearing loss    L>>R: ENT eval 04/2018.  Pt declined MRI brain at that time.  Marland Kitchen History of adenomatous polyp of colon 07/2016  . History of vitamin D deficiency     still present as of 09/2017.  High dose vit D started 10/16/17.  Increased dose to 50K twice per week 02/18/18.  Marland Kitchen Hyperlipidemia    Mild, never meds  . Metastatic malignant melanoma ([place]) 1989   Recurrence L upper arm 1993.  Then left back and LUL lung recurrence 1995-resected.  Brain lesion recurrence 1996-resected.  RLL lung lesion 1996--chemo & then resection.  Small bowel recurrence resected 1997, then mesenteric lesion resected 1998.  No sign of recurrence since 1998.  Released by onc/Dr. Magrinot as of 12/2015 (needs annual dermatologist exam and annual ophth exam).  . Prediabetes    A1c 5.9% 12/2014.  6.2% 09/2017. 6.3% 04/2019.    Past Surgical History:  Procedure Laterality Date  . BRAIN SURGERY     Removal of melanoma met  . COLONOSCOPY  2009; 07/2016   Dr. Wynetta Emery at [place]; next colonoscopy due 07/2019.  Marland Kitchen COLONOSCOPY WITH PROPOFOL N/A 08/18/2016   Tubular adenoma.  Recall 3 yrs.  Procedure: COLONOSCOPY WITH PROPOFOL;  Surgeon: Garlan Fair, MD;  Location: [facility] ENDOSCOPY;  Service: Endoscopy;  Laterality: N/A;  . LUNG SURGERY     "              "          "        "  . SMALL INTESTINE SURGERY     "               "         "        "  Social History:  reports that he has never smoked. He has quit using smokeless tobacco. He reports current alcohol use. He reports that he does not use drugs.  Allergies:  Allergies  Allergen Reactions  . Aspirin Nausea And Vomiting  . Penicillins Other (See Comments)    Childhood  Has patient had a PCN reaction causing immediate rash, facial/tongue/throat swelling, SOB or lightheadedness with hypotension: unknown Has patient had a PCN reaction causing severe rash involving mucus membranes or skin necrosis: {unkjnown Has patient had a PCN reaction that required hospitalization {no Has patient had a PCN reaction occurring within the last 10 years: no If all of the above answers are "NO", then may proceed with Cephalosporin use.     Medications Prior to Admission  Medication Sig Dispense Refill  . GLUCOSAMINE-CHONDROITIN PO Take 2,000 Units by mouth.    . predniSONE (DELTASONE) 20 MG tablet 3 tabs daily for 1 week then 2 tabs daily for 1 week and then 1 tab daily. 42 tablet 0     Physical Exam: Blood pressure 136/87, pulse 76, temperature 98.5 F (36.9 C), resp. rate 16, height _0  (1.727 m), weight 88.5 kg, SpO2 98 %. General: pleasant, WD, WN white male who is laying in bed, appears uncomfortable HEENT: head is normocephalic, atraumatic.  Sclera are noninjected.  PERRL.  Ears and nose without any masses or lesions.  Mouth is pink and moist Heart: regular, rate, and rhythm.  Normal s1,s2. No obvious murmurs, gallops, or rubs noted.  Palpable radial and pedal pulses bilaterally Lungs: CTAB, no wheezes, rhonchi, or rales noted.  Respiratory effort nonlabored Abd: soft, distended, globally tender to palpation that is worse in his RLQ/right flank. There is rebound tenderness. No palpable HSM. Previous laparotomy scar noted with reducible umbilical hernia. MS: all 4 extremities are symmetrical with no cyanosis, clubbing, or edema. Skin: warm and dry with no masses, lesions, or rashes Neuro: Cranial nerves 2-12 grossly intact, sensation is normal throughout Psych: A&Ox3 with an appropriate affect.   Results for orders placed or performed during the hospital encounter of 09/28/20 (from the past 48 hour(s))  Comprehensive metabolic panel     Status: Abnormal   Collection Time: 09/28/20  7:57 AM  Result Value Ref Range   Sodium 136 135 - 145 mmol/L   Potassium 3.9 3.5 - 5.1 mmol/L   Chloride 100 98 - 111 mmol/L   CO2 25 22 - 32 mmol/L   Glucose, Bld 147 (H) 70 - 99 mg/dL    Comment: Glucose reference range applies only to samples taken after fasting for at least 8 hours.   BUN 14 6 - 20 mg/dL   Creatinine, Ser 0.92 0.61 - 1.24 mg/dL   Calcium 9.0 8.9 - 10.3 mg/dL   Total Protein 7.7 6.5 - 8.1 g/dL   Albumin  4.5 3.5 - 5.0 g/dL   AST 23 15 - 41 U/L   ALT 27 0 - 44 U/L   Alkaline Phosphatase 41 38 - 126 U/L   Total Bilirubin 1.1 0.3 - 1.2 mg/dL   GFR, Estimated >60 >60 mL/min    Comment: (NOTE) Calculated using the CKD-EPI Creatinine Equation (2021)    Anion gap 11 5 - 15    Comment: Performed at [facility], [address] [place], [place] [address]  CBC with Differential     Status: Abnormal   Collection Time: 09/28/20  7:57 AM  Result Value Ref Range   WBC 19.0 (H) 4.0 -  10.5 K/uL   RBC 5.33 4.22 - 5.81 MIL/uL   Hemoglobin 15.5 13.0 - 17.0 g/dL   HCT 46.1 39.0 - 52.0 %   MCV 86.5 80.0 - 100.0 fL   MCH 29.1 26.0 - 34.0 pg   MCHC 33.6 30.0 - 36.0 g/dL   RDW 12.5 11.5 - 15.5 %   Platelets 252 150 - 400 K/uL   nRBC 0.0 0.0 - 0.2 %   Neutrophils Relative % 93 %   Neutro Abs 17.5 (H) 1.7 - 7.7 K/uL   Lymphocytes Relative 3 %   Lymphs Abs 0.6 (L) 0.7 - 4.0 K/uL   Monocytes Relative 4 %   Monocytes Absolute 0.8 0.1 - 1.0 K/uL   Eosinophils Relative 0 %   Eosinophils Absolute 0.0 0.0 - 0.5 K/uL   Basophils Relative 0 %   Basophils Absolute 0.0 0.0 - 0.1 K/uL   Immature Granulocytes 0 %   Abs Immature Granulocytes 0.05 0.00 - 0.07 K/uL    Comment: Performed at [facility], [address] [place], [place] [address]  Lipase, blood     Status: None   Collection Time: 09/28/20  7:57 AM  Result Value Ref Range   Lipase 39 11 - 51 U/L    Comment: Performed at [facility], [address] [place], [place] [address]  SARS Coronavirus 2 by RT PCR (hospital order, performed in [facility] hospital lab)     Status: None   Collection Time: 09/28/20 10:41 AM  Result Value Ref Range   SARS Coronavirus 2 NEGATIVE NEGATIVE    Comment: (NOTE) SARS-CoV-2 target nucleic acids are NOT DETECTED.  The SARS-CoV-2 RNA is generally detectable in upper and lower respiratory specimens during the acute phase of infection. The lowest concentration of SARS-CoV-2 viral  copies this assay can detect is 250 copies / mL. A negative result does not preclude SARS-CoV-2 infection and should not be used as the sole basis for treatment or other patient management decisions.  A negative result may occur with improper specimen collection / handling, submission of specimen other than nasopharyngeal swab, presence of viral mutation(s) within the areas targeted by this assay, and inadequate number of viral copies (<250 copies / mL). A negative result must be combined with clinical observations, patient history, and epidemiological information.  Fact Sheet for Patients:   StrictlyIdeas.no  Fact Sheet for Healthcare Providers: BankingDealers.co.za  This test is not yet approved or  cleared by the [place] FDA and has been authorized for detection and/or diagnosis of SARS-CoV-2 by FDA under an Emergency Use Authorization (EUA).  This EUA will remain in effect (meaning this test can be used) for the duration of the COVID-19 declaration under Section 564(b)(1) of the Act, 21 U.S.C. section 360bbb-3(b)(1), unless the authorization is terminated or revoked sooner.  Performed at [facility], [address] [place], [place] [address]    CT Abdomen Pelvis W Contrast  Result Date: 09/28/2020 CLINICAL DATA:  Right greater than left lower abdominal pain. EXAM: CT ABDOMEN AND PELVIS WITH CONTRAST TECHNIQUE: Multidetector CT imaging of the abdomen and pelvis was performed using the standard protocol following bolus administration of intravenous contrast. CONTRAST:  179m OMNIPAQUE IOHEXOL 300 MG/ML  SOLN COMPARISON:  None. FINDINGS: Lower chest: Scarring in the bilateral lower lobes. Normal size heart. No pericardial effusion. Left anterior descending coronary artery calcifications. Hepatobiliary: No suspicious hepatic lesion. Gallbladder is unremarkable. No biliary ductal dilation. Pancreas: Unremarkable Spleen:  Unremarkable Adrenals/Urinary Tract: Bilateral adrenal glands are unremarkable. No hydronephrosis. Symmetric bilateral renal enhancement. 1.6 cm right interpolar renal cyst. Urinary bladder is grossly unremarkable for degree of distension. Stomach/Bowel: Stomach is unremarkable. Prominent loops of small bowel with gentle tapering to normal bowel for instance measuring 2.9 cm in the left hemiabdomen on image 63/3. No evidence of small-bowel obstruction. There is inflammatory stranding along the terminal ileum and cecum. Descending and sigmoid colonic diverticulosis without findings of acute diverticulitis. Appendix: Location: Right lower quadrant on image 63/3 and 50/6 Diameter: 9 mm Appendicolith: Absent Mucosal hyper-enhancement: Present Extraluminal gas: Absent Periappendiceal collection: Absent Vascular/Lymphatic: Aortic atherosclerosis. No enlarged abdominal or pelvic lymph nodes. Reproductive: Prostate is unremarkable. Other: No drainable fluid collection. Small fat containing left ventral hernia. Musculoskeletal: No acute osseous abnormality. IMPRESSION: 1. Acute uncomplicated appendicitis. 2. Colonic diverticulosis without findings of acute diverticulitis. 3. Small fat containing left ventral hernia. 4. Aortic atherosclerosis.  Aortic Atherosclerosis (ICD10-I70.0). These results were called by telephone at the time of interpretation on 09/28/2020 at 9:44 am to provider [facility] , who verbally acknowledged these results. Electronically Signed   By: Dahlia Bailiff MD   On: 09/28/2020 09:44   CT L-SPINE NO CHARGE  Result Date: 09/28/2020 CLINICAL DATA:  Low abdominal pain, right greater than left. EXAM: CT LUMBAR SPINE WITHOUT CONTRAST TECHNIQUE: Multidetector CT imaging of the lumbar spine was performed without intravenous contrast administration. Multiplanar CT image reconstructions were also generated. COMPARISON:  None. FINDINGS: Segmentation: 5 lumbar type vertebrae. Alignment: Normal. Vertebrae:  No acute fracture or aggressive osseous lesion. Paraspinal and other soft tissues: Abdominal aortic atherosclerosis. No acute paraspinal abnormality. 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 Disc levels: Disc spaces: Mild degenerative disease with disc height loss at T11-12 and T12-L1. T12-L1: No disc protrusion, foraminal stenosis or central canal stenosis. L1-L2: No disc protrusion, foraminal stenosis or central canal stenosis. L2-L3: Mild broad-based disc bulge. Mild bilateral facet arthropathy. No foraminal stenosis. L3-L4: Broad-based disc bulge. Mild bilateral facet arthropathy. No foraminal stenosis. L4-L5: Mild broad-based disc bulge with a left paracentral disc protrusion. Severe bilateral facet arthropathy. Mild spinal stenosis. L5-S1: No disc protrusion, foraminal stenosis or central canal stenosis. Moderate facet arthropathy. IMPRESSION: 1. No acute osseous injury of the lumbar spine. 2. Lumbar spine spondylosis as described above. 3. Aortic Atherosclerosis (ICD10-I70.0). Electronically Signed   By: Kathreen Devoid   On: 09/28/2020 09:33      Assessment/Plan Metastatic melanoma  Acute appendicitis The patient appears to have acute appendicitis.  This is retrocecal and may be the cause of his more acute back pain on the right side.  He has received his pre-operative dose of abx therapy  He will be admitted under observation with plans to proceed to the OR today for a lap appy. The procedure and risks of appendectomy. The risks include but are not limited to bleeding, infection, damage to surrounding structures, possible ileocecectomy, wound problems, anesthesia, injury to intra-abdominal organs, possibility of postoperative ileus. He seems to understand and agrees with the plan.   FEN - NPO/IVFs VTE - lovenox to start tomorrow ID - [email or web address] Admit - observation  Obie Dredge, [facility] Surgery 09/28/2020, 2:14 PM Please see Amion for pager number  during day hours 7:00am-4:30pm or 7:00am -11:30am on weekends

## 2020-09-28 NOTE — ED Notes (Signed)
PIV access attempt unsuccessful x2, u/s guided insertion to be attempted.

## 2020-09-28 NOTE — Progress Notes (Signed)
Pharmacy Antibiotic Note  Sean [name] is a 60 y.o. male admitted on 09/28/2020 with acute appendicitis Pharmacy has been consulted for merrem dosing.  Plan: merrem 1gm IV q8h Follow renal function and clinical course  Height: 5\' 8"  (172.7 cm) Weight: 88.5 kg (195 lb 1.7 oz) IBW/kg (Calculated) : 68.4  Temp (24hrs), Avg:98.6 F (37 C), Min:97.7 F (36.5 C), Max:98.9 F (37.2 C)  Recent Labs  Lab 09/28/20 0757  WBC 19.0*  CREATININE 0.92    Estimated Creatinine Clearance: 93.4 mL/min (by C-G formula based on SCr of 0.92 mg/dL).    Allergies  Allergen Reactions  . Aspirin Nausea And Vomiting  . Penicillins Other (See Comments)    Childhood  Has patient had a PCN reaction causing immediate rash, facial/tongue/throat swelling, SOB or lightheadedness with hypotension: unknown Has patient had a PCN reaction causing severe rash involving mucus membranes or skin necrosis: {unkjnown Has patient had a PCN reaction that required hospitalization {no Has patient had a PCN reaction occurring within the last 10 years: no If all of the above answers are "NO", then may proceed with Cephalosporin use.     Thank you for allowing pharmacy to be a part of this patient's care.  Dolly Rias RPh 09/28/2020, 11:59 PM

## 2020-09-28 NOTE — ED Notes (Signed)
Report provided to [facility] for transport ETA [id number]

## 2020-09-28 NOTE — ED Provider Notes (Signed)
[facility] EMERGENCY DEPARTMENT Provider Note   CSN: [id number] Arrival date & time: 09/28/20  7829     History Chief Complaint  Patient presents with  . Abdominal Pain  . Constipation    Sean [name] is a 60 y.o. male.  HPI   History of HLD, metastatic melanoma to lung/brain/colon with multiple resections presents to the emergency department with generalized abdominal pain.  Patient states about a month ago he completed a 3-week course of prednisone for poison ivy.  Since then he feels as if his stools have been different.  He usually goes regularly twice a day.  Since then he has been intermittently going once a day, had scattered episodes of diarrhea and feels like his stools are smaller.  He has not recently had a colonoscopy or other evaluation in terms of the melanoma.  He denies any nausea/vomiting but does endorse decreased appetite.  No fever, chest pain, cough.  No genitourinary symptoms.  Also of note patient has been having lower back pain that is currently being evaluated by his primary doctor and treated as musculoskeletal pain.  This back pain is located around his sacrum and does not radiate, he has no flank pain.  Past Medical History:  Diagnosis Date  . Asymmetrical sensorineural hearing loss    L>>R: ENT eval 04/2018.  Pt declined MRI brain at that time.  Marland Kitchen History of adenomatous polyp of colon 07/2016  . History of vitamin D deficiency    still present as of 09/2017.  High dose vit D started 10/16/17.  Increased dose to 50K twice per week 02/18/18.  Marland Kitchen Hyperlipidemia    Mild, never meds  . Metastatic malignant melanoma ([place]) 1989   Recurrence L upper arm 1993.  Then left back and LUL lung recurrence 1995-resected.  Brain lesion recurrence 1996-resected.  RLL lung lesion 1996--chemo & then resection.  Small bowel recurrence resected 1997, then mesenteric lesion resected 1998.  No sign of recurrence since 1998.  Released by onc/Dr. Magrinot as of 12/2015  (needs annual dermatologist exam and annual ophth exam).  . Prediabetes    A1c 5.9% 12/2014.  6.2% 09/2017. 6.3% 04/2019.    Patient Active Problem List   Diagnosis Date Noted  . Metastatic melanoma of brain 12/11/2015  . Prostate cancer screening 02/02/2014  . Health maintenance examination 01/16/2014  . Melanoma of skin ([place]) 09/10/2011  . Hyperlipidemia 06/02/2011    Past Surgical History:  Procedure Laterality Date  . BRAIN SURGERY     Removal of melanoma met  . COLONOSCOPY  2009; 07/2016   Dr. Wynetta Emery at [place]; next colonoscopy due 07/2019.  Marland Kitchen COLONOSCOPY WITH PROPOFOL N/A 08/18/2016   Tubular adenoma.  Recall 3 yrs.  Procedure: COLONOSCOPY WITH PROPOFOL;  Surgeon: Garlan Fair, MD;  Location: [facility];  Service: Endoscopy;  Laterality: N/A;  . LUNG SURGERY     "              "          "        "  . SMALL INTESTINE SURGERY     "               "         "        "       Family History  Problem Relation Age of Onset  . Cancer Mother        breast  . Alcohol  abuse Father     Social History   Tobacco Use  . Smoking status: Never Smoker  . Smokeless tobacco: Former Network engineer  . Vaping Use: Never used  Substance Use Topics  . Alcohol use: Yes    Comment: occasional  . Drug use: No    Home Medications Prior to Admission medications   Medication Sig Start Date End Date Taking? Authorizing Provider  GLUCOSAMINE-CHONDROITIN PO Take 2,000 Units by mouth.    [provider]  predniSONE (DELTASONE) 20 MG tablet 3 tabs daily for 1 week then 2 tabs daily for 1 week and then 1 tab daily. 08/07/20   Shelda Pal, DO    Allergies    Aspirin and Penicillins  Review of Systems   Review of Systems  Constitutional: Positive for appetite change. Negative for chills and fever.  HENT: Negative for congestion.   Eyes: Negative for visual disturbance.  Respiratory: Negative for shortness of breath.   Cardiovascular: Negative for chest pain.   Gastrointestinal: Positive for abdominal pain, constipation and diarrhea. Negative for blood in stool, nausea, rectal pain and vomiting.  Genitourinary: Negative for dysuria and flank pain.  Musculoskeletal: Positive for back pain.  Skin: Negative for rash.  Neurological: Negative for headaches.    Physical Exam Updated Vital Signs BP 138/80   Pulse 79   Temp 98.5 F (36.9 C) (Oral)   Resp 20   Ht 5' 8"  (1.727 m)   Wt 88.5 kg   SpO2 98%   BMI 29.65 kg/m   Physical Exam Vitals and nursing note reviewed.  Constitutional:      Appearance: Normal appearance.  HENT:     Head: Normocephalic.     Mouth/Throat:     Mouth: Mucous membranes are moist.  Cardiovascular:     Rate and Rhythm: Normal rate.  Pulmonary:     Effort: Pulmonary effort is normal. No respiratory distress.  Abdominal:     General: There is no distension.     Palpations: Abdomen is soft.     Tenderness: There is generalized abdominal tenderness. There is no guarding or rebound.     Hernia: No hernia is present.  Skin:    General: Skin is warm.  Neurological:     Mental Status: He is alert and oriented to person, place, and time. Mental status is at baseline.  Psychiatric:        Mood and Affect: Mood normal.     ED Results / Procedures / Treatments   Labs (all labs ordered are listed, but only abnormal results are displayed) Labs Reviewed  COMPREHENSIVE METABOLIC PANEL  CBC WITH DIFFERENTIAL/PLATELET  LIPASE, BLOOD    EKG None  Radiology No results found.  Procedures Procedures   Medications Ordered in ED Medications  sodium chloride 0.9 % bolus 500 mL (has no administration in time range)  morphine 4 MG/ML injection 4 mg (has no administration in time range)    ED Course  I have reviewed the triage vital signs and the nursing notes.  Pertinent labs & imaging results that were available during my care of the patient were reviewed by me and considered in my medical decision making  (see chart for details).    MDM Rules/Calculators/A&P                          60 year old male with past medical history of metastatic melanoma presents the emergency department generalized abdominal pain and change  in stools.  He is afebrile on arrival with normal vitals.  He has decreased normal bowel sounds, no abdominal distention, generalized discomfort to palpation.  No history of AAA/hernia.  Blood work shows a leukocytosis of 19.  CAT scan confirms uncomplicated appendicitis.  Additional pain medicine and antibiotics for his penicillin allergy have been ordered.  Spoke with the physician assistant for Dr. Hassell Done, general surgery, they are admitting the patient and will plan for laparoscopic appendectomy.  Patient admitted to their service.  Patients evaluation and results requires admission for further treatment and care. Patient agrees with admission plan, offers no new complaints and is stable/unchanged at time of admit.  Final Clinical Impression(s) / ED Diagnoses Final diagnoses:  Pain    Rx / DC Orders ED Discharge Orders    None       Lorelle Gibbs, DO 09/28/20 1033

## 2020-09-28 NOTE — ED Triage Notes (Signed)
Generalized abd pain since last night. Denies N/V/D

## 2020-09-28 NOTE — Op Note (Signed)
Preoperative diagnosis: Acute appendicitis  Postoperative diagnosis acute perforated appendicitis with gangrene  Procedure: Laparoscopic appendectomy  Surgeon: Erroll Luna, MD  Anesthesia: General with 0.25% Marcaine plain  EBL: 60 cc  Specimen: Appendix to pathology  Drains: None  Indications for procedure: Patient presents for laparoscopic appendectomy.  He was seen earlier today.  He complained of a 1 day history of abdominal pain and CT scan showed uncomplicated nonperforated appendicitis.  He was brought to [facility] for treatment.  He was met in the holding area.  I reviewed his CT scan and examined him.  He was having significant pain at this point time and I recommend a laparoscopic appendectomy over medical management.  He had a previous laparotomy I discussed the potential complications of bowel injury in the circumstance with laparoscopic approach but certainly we would try this but obviously if we had open wound.  Risk of bleeding, infection, bowel injury, colon injury, fistula, abscess, injury to other neighboring structures, death, DVT, cardiovascular event, wound complications, need for further surgery.  He understood the potential need for open and risk of bowel injury as well.  He agreed to proceed.  Description of procedure: The patient was met in the holding area.  The procedure was reviewed.  He was then taken back to the operating room.  He was placed supine.  Both arms were tucked.  After induction of general esthesia the abdomen was prepped and draped sterile fashion timeout performed.  He was already on antibiotics.  He had a previous midline laparotomy scar.  I opted to place a right upper quadrant 5 mm Optiview port under direct vision without difficulty.  All abdominal wall layers were visualized as the scope was passed to the abdominal wall.  No evidence of bowel injury with insertion of this port.  Insufflation was then to 15 mmHg of CO2.  There were dense midline  adhesions with the right lower quadrant was quite open.  I placed 2 additional 5 mm ports in the right mid and right lower quadrant under direct vision.  There is no evidence of bowel injury with insertion of his ports.  He had severe inflammation of his right lower quadrant.  This certainly was not 1 day worth of appendicitis.  We had the patient rolled to his left.  Took some time we identified the appendix and it was retrocecal and it had a mid appendiceal perforation.  There is no abscess.  There is significant inflammation of the cecum in the area though.  Carefully we took the mesoappendix down using the harmonic scalpel with care taken not to injure the bowel.  This was taken down to the base the appendix.  Once this was done I replaced the right upper quadrant port with a 12 mm port.  A GIA 45 stapler was passed through this and fired across the base of the appendix.  This was then placed into an Endo Catch bag.  It was pulled out the right upper quadrant port site.  Irrigation was used to irrigate out the right lower quadrant.  We used about 4 L of this.  Surgicel snow was placed into the mesoappendix due to some oozing and this helped stop the bleeding quite nicely.  This was inspected and there is no signs of any further bleeding.  After irrigation was suctioned out I reexamined the port sites and saw no evidence of bowel injury with insertion of these or during the case.  At this point time a suture passer  was used to close the right upper quadrant port site with a 0 Vicryl.  We then allowed the CO2 to escape and removed reports.  Additional 0 Vicryl was placed in the right upper quadrant incision as well externally.  4-0 Monocryl is used to close skin incisions.  Dermabond applied.  All counts were found to be correct.  The patient was awoke extubated taken recovery in satisfactory condition.

## 2020-09-29 DIAGNOSIS — H903 Sensorineural hearing loss, bilateral: Secondary | ICD-10-CM | POA: Diagnosis present

## 2020-09-29 DIAGNOSIS — R7303 Prediabetes: Secondary | ICD-10-CM | POA: Diagnosis present

## 2020-09-29 LAB — COMPREHENSIVE METABOLIC PANEL
ALT: 18 U/L (ref 0–44)
AST: 14 U/L — ABNORMAL LOW (ref 15–41)
Albumin: 3.4 g/dL — ABNORMAL LOW (ref 3.5–5.0)
Alkaline Phosphatase: 33 U/L — ABNORMAL LOW (ref 38–126)
Anion gap: 9 (ref 5–15)
BUN: 11 mg/dL (ref 6–20)
CO2: 25 mmol/L (ref 22–32)
Calcium: 8.1 mg/dL — ABNORMAL LOW (ref 8.9–10.3)
Chloride: 103 mmol/L (ref 98–111)
Creatinine, Ser: 0.81 mg/dL (ref 0.61–1.24)
GFR, Estimated: >60 mL/min (ref >60)
Glucose, Bld: 162 mg/dL — ABNORMAL HIGH (ref 70–99)
Potassium: 4 mmol/L (ref 3.5–5.1)
Sodium: 137 mmol/L (ref 135–145)
Total Bilirubin: 1.1 mg/dL (ref 0.3–1.2)
Total Protein: 6.4 g/dL — ABNORMAL LOW (ref 6.5–8.1)

## 2020-09-29 LAB — CBC
HCT: 41.2 % (ref 39.0–52.0)
Hemoglobin: 13.6 g/dL (ref 13.0–17.0)
MCH: 29.4 pg (ref 26.0–34.0)
MCHC: 33 g/dL (ref 30.0–36.0)
MCV: 89 fL (ref 80.0–100.0)
Platelets: 179 10*3/uL (ref 150–400)
RBC: 4.63 MIL/uL (ref 4.22–5.81)
RDW: 12.9 % (ref 11.5–15.5)
WBC: 16.1 10*3/uL — ABNORMAL HIGH (ref 4.0–10.5)
nRBC: 0 % (ref 0.0–0.2)

## 2020-09-29 MED ORDER — OXYCODONE HCL 5 MG PO TABS
5.0000 mg | ORAL_TABLET | ORAL | Status: DC | PRN
  Administered 2020-09-29 – 2020-09-30 (×5): 10 mg via ORAL
  Filled 2020-09-29 (×5): qty 2

## 2020-09-29 MED ORDER — GUAIFENESIN-DM 100-10 MG/5ML PO SYRP: 10.0000 mL | ORAL_SOLUTION | ORAL | Status: DC | PRN

## 2020-09-29 MED ORDER — ALUM & MAG HYDROXIDE-SIMETH 200-200-20 MG/5ML PO SUSP
30.0000 mL | Freq: Four times a day (QID) | ORAL | Status: DC | PRN
  Administered 2020-10-01 – 2020-10-02 (×2): 30 mL via ORAL
  Filled 2020-09-29 (×2): qty 30

## 2020-09-29 MED ORDER — HYDROCORTISONE 1 % EX CREA
1.0000 "application " | TOPICAL_CREAM | Freq: Three times a day (TID) | CUTANEOUS | Status: DC | PRN
  Filled 2020-09-29: qty 28

## 2020-09-29 MED ORDER — PHENOL 1.4 % MT LIQD
1.0000 | OROMUCOSAL | Status: DC | PRN
  Filled 2020-09-29 (×2): qty 177

## 2020-09-29 MED ORDER — SODIUM CHLORIDE 0.9 % IV SOLN
8.0000 mg | Freq: Four times a day (QID) | INTRAVENOUS | Status: DC | PRN
  Filled 2020-09-29: qty 4

## 2020-09-29 MED ORDER — MENTHOL 3 MG MT LOZG
1.0000 | LOZENGE | OROMUCOSAL | Status: DC | PRN
  Filled 2020-09-29: qty 9

## 2020-09-29 MED ORDER — LACTATED RINGERS IV BOLUS: 1000.0000 mL | Freq: Three times a day (TID) | INTRAVENOUS | Status: AC | PRN

## 2020-09-29 MED ORDER — HYDROCORTISONE (PERIANAL) 2.5 % EX CREA
1.0000 "application " | TOPICAL_CREAM | Freq: Four times a day (QID) | CUTANEOUS | Status: DC | PRN
  Filled 2020-09-29: qty 28.35

## 2020-09-29 MED ORDER — ONDANSETRON HCL 4 MG/2ML IJ SOLN
4.0000 mg | Freq: Four times a day (QID) | INTRAMUSCULAR | Status: DC | PRN
  Administered 2020-09-30 – 2020-10-03 (×3): 4 mg via INTRAVENOUS
  Filled 2020-09-29 (×3): qty 2

## 2020-09-29 MED ORDER — ACETAMINOPHEN 500 MG PO TABS
1000.0000 mg | ORAL_TABLET | Freq: Four times a day (QID) | ORAL | Status: DC
  Administered 2020-09-29 – 2020-10-03 (×12): 1000 mg via ORAL
  Filled 2020-09-29 (×12): qty 2

## 2020-09-29 MED ORDER — LIP MEDEX EX OINT
1.0000 "application " | TOPICAL_OINTMENT | Freq: Two times a day (BID) | CUTANEOUS | Status: DC
  Administered 2020-09-29 – 2020-10-08 (×18): 1 via TOPICAL
  Filled 2020-09-29 (×2): qty 7

## 2020-09-29 MED ORDER — SIMETHICONE 40 MG/0.6ML PO SUSP
40.0000 mg | Freq: Four times a day (QID) | ORAL | Status: DC | PRN
  Filled 2020-09-29: qty 0.6

## 2020-09-29 MED ORDER — SODIUM CHLORIDE 0.9 % IV SOLN: 250.0000 mL | INTRAVENOUS | Status: DC | PRN

## 2020-09-29 MED ORDER — METHOCARBAMOL 1000 MG/10ML IJ SOLN
1000.0000 mg | Freq: Four times a day (QID) | INTRAVENOUS | Status: DC | PRN
  Administered 2020-10-04 – 2020-10-05 (×4): 1000 mg via INTRAVENOUS
  Filled 2020-09-29: qty 1000
  Filled 2020-09-29: qty 10
  Filled 2020-09-29 (×3): qty 1000

## 2020-09-29 MED ORDER — ENSURE SURGERY PO LIQD
237.0000 mL | Freq: Two times a day (BID) | ORAL | Status: DC
  Administered 2020-09-29 (×2): 237 mL via ORAL

## 2020-09-29 MED ORDER — SODIUM CHLORIDE 0.9% FLUSH
3.0000 mL | INTRAVENOUS | Status: DC | PRN
  Administered 2020-10-04: 3 mL via INTRAVENOUS

## 2020-09-29 MED ORDER — ALBUTEROL SULFATE (2.5 MG/3ML) 0.083% IN NEBU: 2.5000 mg | INHALATION_SOLUTION | Freq: Four times a day (QID) | RESPIRATORY_TRACT | Status: DC | PRN

## 2020-09-29 MED ORDER — FENTANYL CITRATE (PF) 100 MCG/2ML IJ SOLN: 25.0000 ug | INTRAMUSCULAR | Status: DC | PRN

## 2020-09-29 MED ORDER — SODIUM CHLORIDE 0.9% FLUSH
3.0000 mL | Freq: Two times a day (BID) | INTRAVENOUS | Status: DC
  Administered 2020-09-29 – 2020-10-07 (×9): 3 mL via INTRAVENOUS

## 2020-09-29 MED ORDER — METOPROLOL TARTRATE 5 MG/5ML IV SOLN
5.0000 mg | Freq: Four times a day (QID) | INTRAVENOUS | Status: DC | PRN
  Filled 2020-09-29: qty 5

## 2020-09-29 MED ORDER — PROCHLORPERAZINE EDISYLATE 10 MG/2ML IJ SOLN: 5.0000 mg | INTRAMUSCULAR | Status: DC | PRN

## 2020-09-29 MED ORDER — CALCIUM POLYCARBOPHIL 625 MG PO TABS
625.0000 mg | ORAL_TABLET | Freq: Two times a day (BID) | ORAL | Status: DC
  Administered 2020-09-29 – 2020-10-02 (×6): 625 mg via ORAL
  Filled 2020-09-29 (×6): qty 1

## 2020-09-29 MED ORDER — METRONIDAZOLE IN NACL 5-0.79 MG/ML-% IV SOLN
500.0000 mg | Freq: Three times a day (TID) | INTRAVENOUS | Status: DC
  Administered 2020-09-29 – 2020-10-01 (×6): 500 mg via INTRAVENOUS
  Filled 2020-09-29 (×6): qty 100

## 2020-09-29 MED ORDER — MAGIC MOUTHWASH
15.0000 mL | Freq: Four times a day (QID) | ORAL | Status: DC | PRN
  Filled 2020-09-29: qty 15

## 2020-09-29 NOTE — Progress Notes (Signed)
Sean [name] [id number] February 26, 1961  CARE TEAM:  PCP: Tammi Sou, MD  Outpatient Care Team: Patient Care Team: Tammi Sou, MD as PCP - General (Family Medicine) Magrinat, Virgie Dad, MD as Consulting Physician (Oncology) Crista Luria, MD as Consulting Physician (Dermatology) Garlan Fair, MD as Consulting Physician (Gastroenterology)  Inpatient Treatment Team: Treatment Team: Attending Provider: Edison Pace, Md, MD; Attending Physician: Edison Pace Md, MD; Technician: Cloria Spring, [place]; Registered Nurse: Dorinda Hill, RN; Utilization Review: Conception Oms, RN   Problem List:   Principal Problem:   Acute gangrenous appendicitis s/p lap appendectomy 09/28/2020 Active Problems:   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 [facility])   Hyperlipidemia   Prediabetes   Metastatic malignant melanoma ([place])   History of adenomatous polyp of colon   Asymmetrical sensorineural hearing loss   1 Day Post-Op  09/28/2020  Preoperative diagnosis: Acute appendicitis  Postoperative diagnosis acute perforated appendicitis with gangrene  Procedure: Laparoscopic appendectomy  Surgeon: Erroll Luna, MD    Assessment  [facility]  [facility] Stay = 1 days)  Plan:  -IV ABx x 5 d w gangrene -adv diet -ML IVF -switch pain meds w sedation on Dilaudid -VTE prophylaxis- SCDs, etc -mobilize as tolerated to help recovery  Disposition:  Disposition:  The patient is from: Home  Anticipate discharge to:  Home  Anticipated Date of Discharge is:  March 13,2022    Barriers to discharge:  Pending Clinical improvement (more likely than not)  Patient currently is NOT MEDICALLY STABLE for discharge from the hospital from a surgery standpoint.      20 minutes spent in review, evaluation, examination, counseling, and coordination of care.   I have reviewed this patient's available data, including medical history, events of note, physical examination and test results as part of my  evaluation.  A significant portion of that time was spent in counseling.  Care during the described time interval was provided by me.  09/29/2020    Subjective: (Chief complaint)  Hypoxic w Dilaudid/sedated Walked in hallway Bloated but tried PO  Objective:  Vital signs:  Vitals:   09/29/20 0241 09/29/20 0449 09/29/20 0954 09/29/20 0958  BP: 130/71 (!) 131/91 115/69 115/69  Pulse: (!) 53 (!) 54 62 62  Resp: 16 18 16 16   Temp: 97.6 F (36.4 C) 98.4 F (36.9 C) 98 F (36.7 C) 98 F (36.7 C)  TempSrc: Oral Oral Oral Oral  SpO2: 93% 94% (!) 89% (!) 89%  Weight:      Height:           Intake/Output   Yesterday:  03/11 0701 - 03/12 0700 In: 2889.5 [P.O.:180; I.V.:2409.5; IV Piggyback:300] Out: 0370 [Urine:1750] This shift:  Total I/O In: 940 [P.O.:840; IV Piggyback:100] Out: 0   Bowel function:  Flatus: No  BM:  No  Drain: (No drain)   Physical Exam:  General: Pt awake/alert in no acute distress Eyes: PERRL, normal EOM.  Sclera clear.  No icterus Neuro: CN II-XII intact w/o focal sensory/motor deficits. Lymph: No head/neck/groin lymphadenopathy Psych:  No delerium/psychosis/paranoia.  Oriented x 4 HENT: Normocephalic, Mucus membranes moist.  No thrush Neck: Supple, No tracheal deviation.  No obvious thyromegaly Chest: No pain to chest wall compression.  Good respiratory excursion.  No audible wheezing CV:  Pulses intact.  Regular rhythm.  No major extremity edema MS: Normal AROM mjr joints.  No obvious deformity  Abdomen: Somewhat firm.  Mildy distended.  Mildly tender at incisions only.  No evidence of peritonitis.  No incarcerated  hernias.  Ext:  No deformity.  No mjr edema.  No cyanosis Skin: No petechiae / purpurea.  No major sores.  Warm and dry    Results:   Cultures: Recent Results (from the past 720 hour(s))  SARS Coronavirus 2 by RT PCR (hospital order, performed in [place] hospital lab)     Status: None   Collection Time: 09/28/20  10:41 AM  Result Value Ref Range Status   SARS Coronavirus 2 NEGATIVE NEGATIVE Final    Comment: (NOTE) SARS-CoV-2 target nucleic acids are NOT DETECTED.  The SARS-CoV-2 RNA is generally detectable in upper and lower respiratory specimens during the acute phase of infection. The lowest concentration of SARS-CoV-2 viral copies this assay can detect is 250 copies / mL. A negative result does not preclude SARS-CoV-2 infection and should not be used as the sole basis for treatment or other patient management decisions.  A negative result may occur with improper specimen collection / handling, submission of specimen other than nasopharyngeal swab, presence of viral mutation(s) within the areas targeted by this assay, and inadequate number of viral copies (<250 copies / mL). A negative result must be combined with clinical observations, patient history, and epidemiological information.  Fact Sheet for Patients:   StrictlyIdeas.no  Fact Sheet for Healthcare Providers: BankingDealers.co.za  This test is not yet approved or  cleared by the [place] FDA and has been authorized for detection and/or diagnosis of SARS-CoV-2 by FDA under an Emergency Use Authorization (EUA).  This EUA will remain in effect (meaning this test can be used) for the duration of the COVID-19 declaration under Section 564(b)(1) of the Act, 21 U.S.C. section 360bbb-3(b)(1), unless the authorization is terminated or revoked sooner.  Performed at [facility], [address], [place], [place] [address]   Surgical pcr screen     Status: None   Collection Time: 09/28/20  1:49 PM   Specimen: Nasal Mucosa; Nasal Swab  Result Value Ref Range Status   MRSA, PCR NEGATIVE NEGATIVE Final   Staphylococcus aureus NEGATIVE NEGATIVE Final    Comment: (NOTE) The Xpert SA Assay (FDA approved for NASAL specimens in patients 95 years of age and older), is one component  of a comprehensive surveillance program. It is not intended to diagnose infection nor to guide or monitor treatment. Performed at [facility], [place] [address] [place], [place] [address]     Labs: Results for orders placed or performed during the hospital encounter of 09/28/20 (from the past 48 hour(s))  Comprehensive metabolic panel     Status: Abnormal   Collection Time: 09/28/20  7:57 AM  Result Value Ref Range   Sodium 136 135 - 145 mmol/L   Potassium 3.9 3.5 - 5.1 mmol/L   Chloride 100 98 - 111 mmol/L   CO2 25 22 - 32 mmol/L   Glucose, Bld 147 (H) 70 - 99 mg/dL    Comment: Glucose reference range applies only to samples taken after fasting for at least 8 hours.   BUN 14 6 - 20 mg/dL   Creatinine, Ser 0.92 0.61 - 1.24 mg/dL   Calcium 9.0 8.9 - 10.3 mg/dL   Total Protein 7.7 6.5 - 8.1 g/dL   Albumin 4.5 3.5 - 5.0 g/dL   AST 23 15 - 41 U/L   ALT 27 0 - 44 U/L   Alkaline Phosphatase 41 38 - 126 U/L   Total Bilirubin 1.1 0.3 - 1.2 mg/dL   GFR, Estimated >60 >60  mL/min    Comment: (NOTE) Calculated using the CKD-EPI Creatinine Equation (2021)    Anion gap 11 5 - 15    Comment: Performed at [facility], [address] [place], [place] [address]  CBC with Differential     Status: Abnormal   Collection Time: 09/28/20  7:57 AM  Result Value Ref Range   WBC 19.0 (H) 4.0 - 10.5 K/uL   RBC 5.33 4.22 - 5.81 MIL/uL   Hemoglobin 15.5 13.0 - 17.0 g/dL   HCT 46.1 39.0 - 52.0 %   MCV 86.5 80.0 - 100.0 fL   MCH 29.1 26.0 - 34.0 pg   MCHC 33.6 30.0 - 36.0 g/dL   RDW 12.5 11.5 - 15.5 %   Platelets 252 150 - 400 K/uL   nRBC 0.0 0.0 - 0.2 %   Neutrophils Relative % 93 %   Neutro Abs 17.5 (H) 1.7 - 7.7 K/uL   Lymphocytes Relative 3 %   Lymphs Abs 0.6 (L) 0.7 - 4.0 K/uL   Monocytes Relative 4 %   Monocytes Absolute 0.8 0.1 - 1.0 K/uL   Eosinophils Relative 0 %   Eosinophils Absolute 0.0 0.0 - 0.5 K/uL   Basophils Relative 0 %   Basophils Absolute 0.0  0.0 - 0.1 K/uL   Immature Granulocytes 0 %   Abs Immature Granulocytes 0.05 0.00 - 0.07 K/uL    Comment: Performed at [facility], [address] [place], [place] [address]  Lipase, blood     Status: None   Collection Time: 09/28/20  7:57 AM  Result Value Ref Range   Lipase 39 11 - 51 U/L    Comment: Performed at [facility], [address] [place], [place] [address]  SARS Coronavirus 2 by RT PCR (hospital order, performed in [place] hospital lab)     Status: None   Collection Time: 09/28/20 10:41 AM  Result Value Ref Range   SARS Coronavirus 2 NEGATIVE NEGATIVE    Comment: (NOTE) SARS-CoV-2 target nucleic acids are NOT DETECTED.  The SARS-CoV-2 RNA is generally detectable in upper and lower respiratory specimens during the acute phase of infection. The lowest concentration of SARS-CoV-2 viral copies this assay can detect is 250 copies / mL. A negative result does not preclude SARS-CoV-2 infection and should not be used as the sole basis for treatment or other patient management decisions.  A negative result may occur with improper specimen collection / handling, submission of specimen other than nasopharyngeal swab, presence of viral mutation(s) within the areas targeted by this assay, and inadequate number of viral copies (<250 copies / mL). A negative result must be combined with clinical observations, patient history, and epidemiological information.  Fact Sheet for Patients:   StrictlyIdeas.no  Fact Sheet for Healthcare Providers: BankingDealers.co.za  This test is not yet approved or  cleared by the [place] FDA and has been authorized for detection and/or diagnosis of SARS-CoV-2 by FDA under an Emergency Use Authorization (EUA).  This EUA will remain in effect (meaning this test can be used) for the duration of the COVID-19 declaration under Section 564(b)(1) of the Act, 21 U.S.C. section  360bbb-3(b)(1), unless the authorization is terminated or revoked sooner.  Performed at [facility], [address], [place], [place] [address]   Surgical pcr screen     Status: None   Collection Time: 09/28/20  1:49 PM   Specimen: Nasal Mucosa; Nasal Swab  Result  Value Ref Range   MRSA, PCR NEGATIVE NEGATIVE   Staphylococcus aureus NEGATIVE NEGATIVE    Comment: (NOTE) The Xpert SA Assay (FDA approved for NASAL specimens in patients 72 years of age and older), is one component of a comprehensive surveillance program. It is not intended to diagnose infection nor to guide or monitor treatment. Performed at [facility], [place] [address] [place], [place] [address]   CBC     Status: Abnormal   Collection Time: 09/29/20  5:37 AM  Result Value Ref Range   WBC 16.1 (H) 4.0 - 10.5 K/uL   RBC 4.63 4.22 - 5.81 MIL/uL   Hemoglobin 13.6 13.0 - 17.0 g/dL   HCT 41.2 39.0 - 52.0 %   MCV 89.0 80.0 - 100.0 fL   MCH 29.4 26.0 - 34.0 pg   MCHC 33.0 30.0 - 36.0 g/dL   RDW 12.9 11.5 - 15.5 %   Platelets 179 150 - 400 K/uL   nRBC 0.0 0.0 - 0.2 %    Comment: Performed at [facility], [place] [address] [place], [place] [address]  Comprehensive metabolic panel     Status: Abnormal   Collection Time: 09/29/20  5:37 AM  Result Value Ref Range   Sodium 137 135 - 145 mmol/L   Potassium 4.0 3.5 - 5.1 mmol/L   Chloride 103 98 - 111 mmol/L   CO2 25 22 - 32 mmol/L   Glucose, Bld 162 (H) 70 - 99 mg/dL    Comment: Glucose reference range applies only to samples taken after fasting for at least 8 hours.   BUN 11 6 - 20 mg/dL   Creatinine, Ser 0.81 0.61 - 1.24 mg/dL   Calcium 8.1 (L) 8.9 - 10.3 mg/dL   Total Protein 6.4 (L) 6.5 - 8.1 g/dL   Albumin 3.4 (L) 3.5 - 5.0 g/dL   AST 14 (L) 15 - 41 U/L   ALT 18 0 - 44 U/L   Alkaline Phosphatase 33 (L) 38 - 126 U/L   Total Bilirubin 1.1 0.3 - 1.2 mg/dL   GFR, Estimated >60 >60 mL/min    Comment:  (NOTE) Calculated using the CKD-EPI Creatinine Equation (2021)    Anion gap 9 5 - 15    Comment: Performed at [facility], [place] [address] [place], [place] [address]    Imaging / Studies: CT Abdomen Pelvis W Contrast  Result Date: 09/28/2020 CLINICAL DATA:  Right greater than left lower abdominal pain. EXAM: CT ABDOMEN AND PELVIS WITH CONTRAST TECHNIQUE: Multidetector CT imaging of the abdomen and pelvis was performed using the standard protocol following bolus administration of intravenous contrast. CONTRAST:  148mL OMNIPAQUE IOHEXOL 300 MG/ML  SOLN COMPARISON:  None. FINDINGS: Lower chest: Scarring in the bilateral lower lobes. Normal size heart. No pericardial effusion. Left anterior descending coronary artery calcifications. Hepatobiliary: No suspicious hepatic lesion. Gallbladder is unremarkable. No biliary ductal dilation. Pancreas: Unremarkable Spleen: Unremarkable Adrenals/Urinary Tract: Bilateral adrenal glands are unremarkable. No hydronephrosis. Symmetric bilateral renal enhancement. 1.6 cm right interpolar renal cyst. Urinary bladder is grossly unremarkable for degree of distension. Stomach/Bowel: Stomach is unremarkable. Prominent loops of small bowel with gentle tapering to normal bowel for instance measuring 2.9 cm in the left hemiabdomen on image 63/3. No evidence of small-bowel obstruction. There is inflammatory stranding along the terminal ileum and cecum. Descending and sigmoid colonic diverticulosis without findings of acute diverticulitis. Appendix: Location: Right lower quadrant on image 63/3 and 50/6 Diameter: 9 mm Appendicolith: Absent Mucosal  hyper-enhancement: Present Extraluminal gas: Absent Periappendiceal collection: Absent Vascular/Lymphatic: Aortic atherosclerosis. No enlarged abdominal or pelvic lymph nodes. Reproductive: Prostate is unremarkable. Other: No drainable fluid collection. Small fat containing left ventral hernia. Musculoskeletal: No acute  osseous abnormality. IMPRESSION: 1. Acute uncomplicated appendicitis. 2. Colonic diverticulosis without findings of acute diverticulitis. 3. Small fat containing left ventral hernia. 4. Aortic atherosclerosis.  Aortic Atherosclerosis (ICD10-I70.0). These results were called by telephone at the time of interpretation on 09/28/2020 at 9:44 am to provider [facility] , who verbally acknowledged these results. Electronically Signed   By: Dahlia Bailiff MD   On: 09/28/2020 09:44   CT L-SPINE NO CHARGE  Result Date: 09/28/2020 CLINICAL DATA:  Low abdominal pain, right greater than left. EXAM: CT LUMBAR SPINE WITHOUT CONTRAST TECHNIQUE: Multidetector CT imaging of the lumbar spine was performed without intravenous contrast administration. Multiplanar CT image reconstructions were also generated. COMPARISON:  None. FINDINGS: Segmentation: 5 lumbar type vertebrae. Alignment: Normal. Vertebrae: No acute fracture or aggressive osseous lesion. Paraspinal and other soft tissues: Abdominal aortic atherosclerosis. No acute paraspinal abnormality. 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 Disc levels: Disc spaces: Mild degenerative disease with disc height loss at T11-12 and T12-L1. T12-L1: No disc protrusion, foraminal stenosis or central canal stenosis. L1-L2: No disc protrusion, foraminal stenosis or central canal stenosis. L2-L3: Mild broad-based disc bulge. Mild bilateral facet arthropathy. No foraminal stenosis. L3-L4: Broad-based disc bulge. Mild bilateral facet arthropathy. No foraminal stenosis. L4-L5: Mild broad-based disc bulge with a left paracentral disc protrusion. Severe bilateral facet arthropathy. Mild spinal stenosis. L5-S1: No disc protrusion, foraminal stenosis or central canal stenosis. Moderate facet arthropathy. IMPRESSION: 1. No acute osseous injury of the lumbar spine. 2. Lumbar spine spondylosis as described above. 3. Aortic Atherosclerosis (ICD10-I70.0). Electronically Signed    By: Kathreen Devoid   On: 09/28/2020 09:33    Medications / Allergies: per chart  Antibiotics: Anti-infectives (From admission, onward)   Start     Dose/Rate Route Frequency Ordered Stop   09/29/20 0045  meropenem (MERREM) 1 g in sodium chloride 0.9 % 100 mL IVPB        1 g 200 mL/hr over 30 Minutes Intravenous Every 8 hours 09/28/20 2358     09/28/20 1030  levofloxacin (LEVAQUIN) IVPB 750 mg  Status:  Discontinued        750 mg 100 mL/hr over 90 Minutes Intravenous Every 24 hours 09/28/20 1018 09/28/20 2343   09/28/20 1030  metroNIDAZOLE (FLAGYL) IVPB 500 mg        500 mg 100 mL/hr over 60 Minutes Intravenous  Once 09/28/20 1018 09/28/20 1211        Note: Portions of this report may have been transcribed using voice recognition software. Every effort was made to ensure accuracy; however, inadvertent computerized transcription errors may be present.   Any transcriptional errors that result from this process are unintentional.    Adin Hector, MD, FACS, MASCRS  Gastrointestinal and Minimally Invasive Surgery  [facility] [address] [place], [place] [address] [phone] Fax [phone] Main/Paging  CONTACT INFORMATION: Weekday (9AM-5PM) concerns: Call [facility] main office at [phone] Weeknight (5PM-9AM) or Weekend/Holiday concerns: Check [email or web address] for General Surgery [facility] coverage (Please, do not use SecureChat as it is not reliable communication to operating surgeons for immediate patient care)      09/29/2020  10:31 AM

## 2020-09-30 ENCOUNTER — Encounter (HOSPITAL_COMMUNITY): Payer: Self-pay | Admitting: Surgery

## 2020-09-30 MED ORDER — LACTATED RINGERS IV SOLN: INTRAVENOUS | Status: AC

## 2020-09-30 MED ORDER — SIMETHICONE 40 MG/0.6ML PO SUSP
40.0000 mg | Freq: Four times a day (QID) | ORAL | Status: AC
  Administered 2020-09-30 – 2020-10-01 (×5): 40 mg via ORAL
  Filled 2020-09-30 (×8): qty 0.6

## 2020-09-30 NOTE — Progress Notes (Signed)
Pt w temp spike up to 102.3 this afternoon. Pt w dk pink flush to face and arms, also c/o's of nausea. MD notified, instructed to give po Tylenol w sip of water, also gave Zofran, no emesis noted. Pt moved to rm 1303.

## 2020-09-30 NOTE — Progress Notes (Signed)
Sean [name] [id number] 10-18-1960  CARE TEAM:  PCP: Tammi Sou, MD  Outpatient Care Team: Patient Care Team: Tammi Sou, MD as PCP - General (Family Medicine) Magrinat, Virgie Dad, MD as Consulting Physician (Oncology) Crista Luria, MD as Consulting Physician (Dermatology) Garlan Fair, MD as Consulting Physician (Gastroenterology)  Inpatient Treatment Team: Treatment Team: Attending Provider: Nolon Nations, MD; Attending Physician: Edison Pace Md, MD; Registered Nurse: Raylene Everts, RN; Registered Nurse: Dorinda Hill, RN; Technician: Karna Christmas, NT; Technician: Caryn Section, Pamala Hurry, [place]   Problem List:   Principal Problem:   Acute gangrenous appendicitis s/p lap appendectomy 09/28/2020 Active Problems:   Metastatic malignant melanoma ([place])   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  [facility] Stay = 2 days)  Plan:  -IV ABx x 5 d w gangrene.  Low threshold to CT scan abdomen postop day 4-5 if not improved since high risk for abscess formation in the setting of gangrenous appendicitis with purulence.  Follow CBC. -Sips only today.  Place NG tube if worse. -Start TNA if needs NG tube and has prolonged ileus > POD#7.  Hopefully not too likely. -IVF with as needed bolus backup. -Try to focus on IV pain control.  Try fentanyl given issues with Dilaudid.  Morphine 10 sedating as well. -VTE prophylaxis- SCDs, etc -mobilize as tolerated to help recovery  Disposition:  Disposition:  The patient  is from: Home  Anticipate discharge to:  Home  Anticipated Date of Discharge is:  March 20,2022    Barriers to discharge:  Pending Clinical improvement (more likely than not)  Patient currently is NOT MEDICALLY STABLE for discharge from the hospital from a surgery standpoint.      20 minutes spent in review, evaluation, examination, counseling, and coordination of care.   I have reviewed this patient's available data, including medical history, events of note, physical examination and test results as part of my evaluation.  A significant portion of that time was spent in counseling.  Care during the described time interval was provided by me.  09/30/2020    Subjective: (Chief complaint)  Feeling more distended and crampy.  Oral pain meds not working.  Appetite down.  Walking hallways more.  Not nauseated.  Feels like he is about to have flatus or bowel movement but not quite  Objective:  Vital signs:  Vitals:   09/29/20 1756 09/29/20 2049 09/30/20 0102 09/30/20 0618  BP: (!) 107/58 121/71 (!) 113/59 132/76  Pulse: (!) 57 (!) 55 61 65  Resp: 16 18 18 18   Temp: (!) 97.4 F (36.3 C) 98 F (36.7 C) (!) 97.5 F (36.4 C) 98.3 F (36.8 C)  TempSrc: Oral Oral Oral Oral  SpO2: 94% 96% 97% 95%  Weight:      Height:           Intake/Output   Yesterday:  03/12 0701 - 03/13 0700 In: 3807 [P.O.:3120; IV Piggyback:687] Out: 0  This shift:  No intake/output data recorded.  Bowel function:  Flatus: No  BM:  No  Drain: (No drain)   Physical Exam:  General: Pt awake/alert in mild acute distress Eyes: PERRL, normal EOM.  Sclera clear.  No icterus Neuro: CN II-XII intact w/o focal sensory/motor deficits. Lymph: No head/neck/groin lymphadenopathy Psych:  No delerium/psychosis/paranoia.  Oriented x 4 HENT: Normocephalic, Mucus membranes moist.  No thrush Neck: Supple, No tracheal deviation.  No obvious thyromegaly Chest: No pain to chest wall compression.   Good respiratory excursion.  No audible wheezing CV:  Pulses intact.  Regular rhythm.  No major extremity edema MS: Normal AROM mjr joints.  No obvious deformity  Abdomen: Somewhat firm.  Very distended.  Mildly tender at incisions only.  Periumbilical small incisional hernia reducible unchanged.  No evidence of peritonitis.  No incarcerated hernias.  Ext:  No deformity.  No mjr edema.  No cyanosis Skin: No petechiae / purpurea.  No major sores.  Warm and dry    Results:   Cultures: Recent Results (from the past 720 hour(s))  SARS Coronavirus 2 by RT PCR (hospital order, performed in [place] hospital lab)     Status: None   Collection Time: 09/28/20 10:41 AM  Result Value Ref Range Status   SARS Coronavirus 2 NEGATIVE NEGATIVE Final    Comment: (NOTE) SARS-CoV-2 target nucleic acids are NOT DETECTED.  The SARS-CoV-2 RNA is generally detectable in upper and lower respiratory specimens during the acute phase of infection. The lowest concentration of SARS-CoV-2 viral copies this assay can detect is 250 copies / mL. A negative result does not preclude SARS-CoV-2 infection and should not be used as the sole basis for treatment or other patient management decisions.  A negative result may occur with improper specimen collection / handling, submission of specimen other than nasopharyngeal swab, presence of viral mutation(s) within the areas targeted by this assay, and inadequate number of viral copies (<250 copies / mL). A negative result must be combined with clinical observations, patient history, and epidemiological information.  Fact Sheet for Patients:   StrictlyIdeas.no  Fact Sheet for Healthcare Providers: BankingDealers.co.za  This test is not yet approved or  cleared by the [place] FDA and has been authorized for detection and/or diagnosis of SARS-CoV-2 by FDA under an Emergency Use Authorization (EUA).  This EUA will  remain in effect (meaning this test can be used) for the duration of the COVID-19 declaration under Section 564(b)(1) of the Act, 21 U.S.C. section 360bbb-3(b)(1), unless the authorization is terminated or revoked sooner.  Performed at [facility], [address] [place], [place] [address]   Surgical pcr screen     Status: None   Collection Time: 09/28/20  1:49 PM   Specimen: Nasal Mucosa; Nasal Swab  Result Value Ref Range Status   MRSA, PCR NEGATIVE NEGATIVE Final   Staphylococcus aureus NEGATIVE NEGATIVE Final    Comment: (NOTE) The Xpert SA Assay (FDA approved for NASAL specimens in patients 41 years of age and older), is one component of a comprehensive surveillance program. It is not intended to diagnose infection nor to guide or monitor treatment. Performed at [facility], [place] [address] [place], [place] [address]     Labs: Results for orders placed or performed during the hospital encounter of 09/28/20 (from the past 48 hour(s))  Comprehensive metabolic panel     Status: Abnormal   Collection Time: 09/28/20  7:57 AM  Result Value Ref Range   Sodium 136 135 - 145 mmol/L   Potassium 3.9 3.5 - 5.1 mmol/L   Chloride 100 98 - 111 mmol/L  CO2 25 22 - 32 mmol/L   Glucose, Bld 147 (H) 70 - 99 mg/dL    Comment: Glucose reference range applies only to samples taken after fasting for at least 8 hours.   BUN 14 6 - 20 mg/dL   Creatinine, Ser 0.92 0.61 - 1.24 mg/dL   Calcium 9.0 8.9 - 10.3 mg/dL   Total Protein 7.7 6.5 - 8.1 g/dL   Albumin 4.5 3.5 - 5.0 g/dL   AST 23 15 - 41 U/L   ALT 27 0 - 44 U/L   Alkaline Phosphatase 41 38 - 126 U/L   Total Bilirubin 1.1 0.3 - 1.2 mg/dL   GFR, Estimated >60 >60 mL/min    Comment: (NOTE) Calculated using the CKD-EPI Creatinine Equation (2021)    Anion gap 11 5 - 15    Comment: Performed at [facility], [address] [place], [place] [address]  CBC with Differential     Status: Abnormal    Collection Time: 09/28/20  7:57 AM  Result Value Ref Range   WBC 19.0 (H) 4.0 - 10.5 K/uL   RBC 5.33 4.22 - 5.81 MIL/uL   Hemoglobin 15.5 13.0 - 17.0 g/dL   HCT 46.1 39.0 - 52.0 %   MCV 86.5 80.0 - 100.0 fL   MCH 29.1 26.0 - 34.0 pg   MCHC 33.6 30.0 - 36.0 g/dL   RDW 12.5 11.5 - 15.5 %   Platelets 252 150 - 400 K/uL   nRBC 0.0 0.0 - 0.2 %   Neutrophils Relative % 93 %   Neutro Abs 17.5 (H) 1.7 - 7.7 K/uL   Lymphocytes Relative 3 %   Lymphs Abs 0.6 (L) 0.7 - 4.0 K/uL   Monocytes Relative 4 %   Monocytes Absolute 0.8 0.1 - 1.0 K/uL   Eosinophils Relative 0 %   Eosinophils Absolute 0.0 0.0 - 0.5 K/uL   Basophils Relative 0 %   Basophils Absolute 0.0 0.0 - 0.1 K/uL   Immature Granulocytes 0 %   Abs Immature Granulocytes 0.05 0.00 - 0.07 K/uL    Comment: Performed at [facility], [address] [place], [place] [address]  Lipase, blood     Status: None   Collection Time: 09/28/20  7:57 AM  Result Value Ref Range   Lipase 39 11 - 51 U/L    Comment: Performed at [facility], [address] [place], [place] [address]  SARS Coronavirus 2 by RT PCR (hospital order, performed in [place] hospital lab)     Status: None   Collection Time: 09/28/20 10:41 AM  Result Value Ref Range   SARS Coronavirus 2 NEGATIVE NEGATIVE    Comment: (NOTE) SARS-CoV-2 target nucleic acids are NOT DETECTED.  The SARS-CoV-2 RNA is generally detectable in upper and lower respiratory specimens during the acute phase of infection. The lowest concentration of SARS-CoV-2 viral copies this assay can detect is 250 copies / mL. A negative result does not preclude SARS-CoV-2 infection and should not be used as the sole basis for treatment or other patient management decisions.  A negative result may occur with improper specimen collection / handling, submission of specimen other than nasopharyngeal swab, presence of viral mutation(s) within the areas targeted by this assay, and  inadequate number of viral copies (<250 copies / mL). A negative result must be combined with clinical observations, patient history, and epidemiological information.  Fact Sheet for Patients:   StrictlyIdeas.no  Fact Sheet for  Healthcare Providers: BankingDealers.co.za  This test is not yet approved or  cleared by the [place] and has been authorized for detection and/or diagnosis of SARS-CoV-2 by FDA under an Emergency Use Authorization (EUA).  This EUA will remain in effect (meaning this test can be used) for the duration of the COVID-19 declaration under Section 564(b)(1) of the Act, 21 U.S.C. section 360bbb-3(b)(1), unless the authorization is terminated or revoked sooner.  Performed at [facility], [address] [place], [place] [address]   Surgical pcr screen     Status: None   Collection Time: 09/28/20  1:49 PM   Specimen: Nasal Mucosa; Nasal Swab  Result Value Ref Range   MRSA, PCR NEGATIVE NEGATIVE   Staphylococcus aureus NEGATIVE NEGATIVE    Comment: (NOTE) The Xpert SA Assay (FDA approved for NASAL specimens in patients 71 years of age and older), is one component of a comprehensive surveillance program. It is not intended to diagnose infection nor to guide or monitor treatment. Performed at [facility], [place] [address] [place], [place] [address]   CBC     Status: Abnormal   Collection Time: 09/29/20  5:37 AM  Result Value Ref Range   WBC 16.1 (H) 4.0 - 10.5 K/uL   RBC 4.63 4.22 - 5.81 MIL/uL   Hemoglobin 13.6 13.0 - 17.0 g/dL   HCT 41.2 39.0 - 52.0 %   MCV 89.0 80.0 - 100.0 fL   MCH 29.4 26.0 - 34.0 pg   MCHC 33.0 30.0 - 36.0 g/dL   RDW 12.9 11.5 - 15.5 %   Platelets 179 150 - 400 K/uL   nRBC 0.0 0.0 - 0.2 %    Comment: Performed at [facility], [place] [address] [place], [place] [address]  Comprehensive metabolic panel     Status: Abnormal    Collection Time: 09/29/20  5:37 AM  Result Value Ref Range   Sodium 137 135 - 145 mmol/L   Potassium 4.0 3.5 - 5.1 mmol/L   Chloride 103 98 - 111 mmol/L   CO2 25 22 - 32 mmol/L   Glucose, Bld 162 (H) 70 - 99 mg/dL    Comment: Glucose reference range applies only to samples taken after fasting for at least 8 hours.   BUN 11 6 - 20 mg/dL   Creatinine, Ser 0.81 0.61 - 1.24 mg/dL   Calcium 8.1 (L) 8.9 - 10.3 mg/dL   Total Protein 6.4 (L) 6.5 - 8.1 g/dL   Albumin 3.4 (L) 3.5 - 5.0 g/dL   AST 14 (L) 15 - 41 U/L   ALT 18 0 - 44 U/L   Alkaline Phosphatase 33 (L) 38 - 126 U/L   Total Bilirubin 1.1 0.3 - 1.2 mg/dL   GFR, Estimated >60 >60 mL/min    Comment: (NOTE) Calculated using the CKD-EPI Creatinine Equation (2021)    Anion gap 9 5 - 15    Comment: Performed at [facility], [place] [address] [place], [place] [address]    Imaging / Studies: CT Abdomen Pelvis W Contrast  Result Date: 09/28/2020 CLINICAL DATA:  Right greater than left lower abdominal pain. EXAM: CT ABDOMEN AND PELVIS WITH CONTRAST TECHNIQUE: Multidetector CT imaging of the abdomen and pelvis was performed using the standard protocol following bolus administration of intravenous contrast. CONTRAST:  142mL OMNIPAQUE IOHEXOL 300 MG/ML  SOLN COMPARISON:  None. FINDINGS: Lower chest: Scarring in the bilateral lower lobes. Normal size heart. No pericardial effusion. Left  anterior descending coronary artery calcifications. Hepatobiliary: No suspicious hepatic lesion. Gallbladder is unremarkable. No biliary ductal dilation. Pancreas: Unremarkable Spleen: Unremarkable Adrenals/Urinary Tract: Bilateral adrenal glands are unremarkable. No hydronephrosis. Symmetric bilateral renal enhancement. 1.6 cm right interpolar renal cyst. Urinary bladder is grossly unremarkable for degree of distension. Stomach/Bowel: Stomach is unremarkable. Prominent loops of small bowel with gentle tapering to normal bowel for instance measuring  2.9 cm in the left hemiabdomen on image 63/3. No evidence of small-bowel obstruction. There is inflammatory stranding along the terminal ileum and cecum. Descending and sigmoid colonic diverticulosis without findings of acute diverticulitis. Appendix: Location: Right lower quadrant on image 63/3 and 50/6 Diameter: 9 mm Appendicolith: Absent Mucosal hyper-enhancement: Present Extraluminal gas: Absent Periappendiceal collection: Absent Vascular/Lymphatic: Aortic atherosclerosis. No enlarged abdominal or pelvic lymph nodes. Reproductive: Prostate is unremarkable. Other: No drainable fluid collection. Small fat containing left ventral hernia. Musculoskeletal: No acute osseous abnormality. IMPRESSION: 1. Acute uncomplicated appendicitis. 2. Colonic diverticulosis without findings of acute diverticulitis. 3. Small fat containing left ventral hernia. 4. Aortic atherosclerosis.  Aortic Atherosclerosis (ICD10-I70.0). These results were called by telephone at the time of interpretation on 09/28/2020 at 9:44 am to provider [facility] , who verbally acknowledged these results. Electronically Signed   By: Dahlia Bailiff MD   On: 09/28/2020 09:44   CT L-SPINE NO CHARGE  Result Date: 09/28/2020 CLINICAL DATA:  Low abdominal pain, right greater than left. EXAM: CT LUMBAR SPINE WITHOUT CONTRAST TECHNIQUE: Multidetector CT imaging of the lumbar spine was performed without intravenous contrast administration. Multiplanar CT image reconstructions were also generated. COMPARISON:  None. FINDINGS: Segmentation: 5 lumbar type vertebrae. Alignment: Normal. Vertebrae: No acute fracture or aggressive osseous lesion. Paraspinal and other soft tissues: Abdominal aortic atherosclerosis. No acute paraspinal abnormality. 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 Disc levels: Disc spaces: Mild degenerative disease with disc height loss at T11-12 and T12-L1. T12-L1: No disc protrusion, foraminal stenosis or central canal  stenosis. L1-L2: No disc protrusion, foraminal stenosis or central canal stenosis. L2-L3: Mild broad-based disc bulge. Mild bilateral facet arthropathy. No foraminal stenosis. L3-L4: Broad-based disc bulge. Mild bilateral facet arthropathy. No foraminal stenosis. L4-L5: Mild broad-based disc bulge with a left paracentral disc protrusion. Severe bilateral facet arthropathy. Mild spinal stenosis. L5-S1: No disc protrusion, foraminal stenosis or central canal stenosis. Moderate facet arthropathy. IMPRESSION: 1. No acute osseous injury of the lumbar spine. 2. Lumbar spine spondylosis as described above. 3. Aortic Atherosclerosis (ICD10-I70.0). Electronically Signed   By: Kathreen Devoid   On: 09/28/2020 09:33    Medications / Allergies: per chart  Antibiotics: Anti-infectives (From admission, onward)   Start     Dose/Rate Route Frequency Ordered Stop   09/29/20 1300  metroNIDAZOLE (FLAGYL) IVPB 500 mg        500 mg 100 mL/hr over 60 Minutes Intravenous Every 8 hours 09/29/20 1110 10/04/20 1259   09/29/20 0045  meropenem (MERREM) 1 g in sodium chloride 0.9 % 100 mL IVPB        1 g 200 mL/hr over 30 Minutes Intravenous Every 8 hours 09/28/20 2358 10/04/20 1109   09/28/20 1030  levofloxacin (LEVAQUIN) IVPB 750 mg  Status:  Discontinued        750 mg 100 mL/hr over 90 Minutes Intravenous Every 24 hours 09/28/20 1018 09/28/20 2343   09/28/20 1030  metroNIDAZOLE (FLAGYL) IVPB 500 mg        500 mg 100 mL/hr over 60 Minutes Intravenous  Once 09/28/20 1018 09/28/20 1211  Note: Portions of this report may have been transcribed using voice recognition software. Every effort was made to ensure accuracy; however, inadvertent computerized transcription errors may be present.   Any transcriptional errors that result from this process are unintentional.    Adin Hector, MD, FACS, MASCRS  Gastrointestinal and Minimally Invasive Surgery  [facility] [address], [place], [place] [address] [phone] Fax [phone] Main/Paging  CONTACT INFORMATION: Weekday (9AM-5PM) concerns: Call [facility] main office at [phone] Weeknight (5PM-9AM) or Weekend/Holiday concerns: Check [email or web address] for General Surgery [facility] coverage (Please, do not use SecureChat as it is not reliable communication to operating surgeons for immediate patient care)      09/30/2020  7:33 AM

## 2020-09-30 NOTE — Progress Notes (Signed)
Pt c/o right lower abdominal pain and abdominal tightness. Upon inspection, pt's abdomen is distended and tender to touch. No BM or gas passed during shift. PRNs given and pt encouraged to walk.

## 2020-09-30 NOTE — Anesthesia Postprocedure Evaluation (Signed)
Anesthesia Post Note  Patient: Sean [name]  Procedure(s) Performed: APPENDECTOMY LAPAROSCOPIC (N/A Abdomen)     Patient location during evaluation: PACU Anesthesia Type: General Level of consciousness: sedated and patient cooperative Pain management: pain level controlled Vital Signs Assessment: post-procedure vital signs reviewed and stable Respiratory status: spontaneous breathing Cardiovascular status: stable Anesthetic complications: no   No complications documented.  Last Vitals:  Vitals:   09/30/20 1735 09/30/20 1954  BP: 134/72 135/76  Pulse: 77 69  Resp: 16 18  Temp: 37.8 C 37.2 C  SpO2: 94% 97%    Last Pain:  Vitals:   09/30/20 1954  TempSrc: Oral  PainSc:                  Nolon Nations

## 2020-10-01 LAB — CREATININE, SERUM
Creatinine, Ser: 0.82 mg/dL (ref 0.61–1.24)
GFR, Estimated: >60 mL/min (ref >60)

## 2020-10-01 LAB — CBC
HCT: 40 % (ref 39.0–52.0)
Hemoglobin: 13.2 g/dL (ref 13.0–17.0)
MCH: 29.6 pg (ref 26.0–34.0)
MCHC: 33 g/dL (ref 30.0–36.0)
MCV: 89.7 fL (ref 80.0–100.0)
Platelets: 185 10*3/uL (ref 150–400)
RBC: 4.46 MIL/uL (ref 4.22–5.81)
RDW: 12.6 % (ref 11.5–15.5)
WBC: 9.1 10*3/uL (ref 4.0–10.5)
nRBC: 0 % (ref 0.0–0.2)

## 2020-10-01 LAB — MAGNESIUM: Magnesium: 1.8 mg/dL (ref 1.7–2.4)

## 2020-10-01 LAB — POTASSIUM: Potassium: 3.5 mmol/L (ref 3.5–5.1)

## 2020-10-01 MED ORDER — MIDAZOLAM HCL 2 MG/2ML IJ SOLN
INTRAMUSCULAR | Status: AC
  Filled 2020-10-01: qty 2

## 2020-10-01 MED ORDER — DEXAMETHASONE SODIUM PHOSPHATE 10 MG/ML IJ SOLN
INTRAMUSCULAR | Status: AC
  Filled 2020-10-01: qty 1

## 2020-10-01 MED ORDER — PROPOFOL 10 MG/ML IV BOLUS
INTRAVENOUS | Status: AC
  Filled 2020-10-01: qty 20

## 2020-10-01 MED ORDER — FENTANYL CITRATE (PF) 250 MCG/5ML IJ SOLN
INTRAMUSCULAR | Status: AC
  Filled 2020-10-01: qty 5

## 2020-10-01 MED ORDER — ONDANSETRON HCL 4 MG/2ML IJ SOLN
INTRAMUSCULAR | Status: AC
  Filled 2020-10-01: qty 2

## 2020-10-01 NOTE — Progress Notes (Signed)
Pharmacy Antibiotic Note  Sean [name] is a 60 y.o. male admitted on 09/28/2020 with acute appendicitis Pharmacy has been consulted for merrem dosing.  Tmax 101.5 > Afebrile this morning, but on Tylenol 1gm q6h scheduled WBC now wnl Renal fx wnl No Cx  Plan: D4 abx,  D3 Meropenem 1gm IV q8h > LD 3/17 am Flagyl 500mg  IV q8 > LD 3/16 pm  Height: 5\' 8"  (172.7 cm) Weight: 88.5 kg (195 lb 1.7 oz) IBW/kg (Calculated) : 68.4  Temp (24hrs), Avg:100 F (37.8 C), Min:98.4 F (36.9 C), Max:102.3 F (39.1 C)  Recent Labs  Lab 09/28/20 0757 09/29/20 0537 10/01/20 0419  WBC 19.0* 16.1* 9.1  CREATININE 0.92 0.81 0.82    Estimated Creatinine Clearance: 104.8 mL/min (by C-G formula based on SCr of 0.82 mg/dL).    Allergies  Allergen Reactions  . Aspirin Nausea And Vomiting  . Penicillins Other (See Comments)    Childhood  Has patient had a PCN reaction causing immediate rash, facial/tongue/throat swelling, SOB or lightheadedness with hypotension: unknown Has patient had a PCN reaction causing severe rash involving mucus membranes or skin necrosis: {unkjnown Has patient had a PCN reaction that required hospitalization {no Has patient had a PCN reaction occurring within the last 10 years: no If all of the above answers are "NO", then may proceed with Cephalosporin use.    Thank you for allowing pharmacy to be a part of this patient's care.  Minda Ditto PharmD 10/01/2020, 7:30 AM

## 2020-10-01 NOTE — Progress Notes (Addendum)
Pt had 2 BMs during the night. Pt's bloating and gas pain is much better.

## 2020-10-01 NOTE — Progress Notes (Signed)
3 Days Post-Op  Subjective: Feels much better today.  Finally had 3 BMs overnight.  No nausea.  Running some fevers, 102 overnight.  Pain is in RLQ and otherwise controlled.  ROS: See above, otherwise other systems negative  Objective: Vital signs in last 24 hours: Temp:  [98.4 F (36.9 C)-102.3 F (39.1 C)] 100.5 F (38.1 C) (03/14 0740) Pulse Rate:  [66-77] 68 (03/14 0541) Resp:  [16-18] 18 (03/14 0541) BP: (128-135)/(70-82) 128/82 (03/14 0541) SpO2:  [91 %-97 %] 95 % (03/14 0541) Last BM Date: 10/01/20  Intake/Output from previous day: 03/13 0701 - 03/14 0700 In: 2550.3 [P.O.:600; I.V.:1450.3; IV Piggyback:500] Out: 1450 [Urine:1450] Intake/Output this shift: No intake/output data recorded.  PE: Heart: regular Lungs: CTAB Abd: soft, appropriately tender, but more so in RLQ, +BS, incisions c/d/i  Lab Results:  Recent Labs    09/29/20 0537 10/01/20 0419  WBC 16.1* 9.1  HGB 13.6 13.2  HCT 41.2 40.0  PLT 179 185   BMET Recent Labs    09/29/20 0537 10/01/20 0419  NA 137  --   K 4.0 3.5  CL 103  --   CO2 25  --   GLUCOSE 162*  --   BUN 11  --   CREATININE 0.81 0.82  CALCIUM 8.1*  --    PT/INR No results for input(s): LABPROT, INR in the last 72 hours. CMP     Component Value Date/Time   NA 137 09/29/2020 0537   NA 139 12/04/2015 1551   K 3.5 10/01/2020 0419   K 3.9 12/04/2015 1551   CL 103 09/29/2020 0537   CL 104 10/06/2012 1546   CO2 25 09/29/2020 0537   CO2 26 12/04/2015 1551   GLUCOSE 162 (H) 09/29/2020 0537   GLUCOSE 88 12/04/2015 1551   GLUCOSE 117 (H) 10/06/2012 1546   BUN 11 09/29/2020 0537   BUN 13.8 12/04/2015 1551   CREATININE 0.82 10/01/2020 0419   CREATININE 0.9 12/04/2015 1551   CALCIUM 8.1 (L) 09/29/2020 0537   CALCIUM 8.9 12/04/2015 1551   PROT 6.4 (L) 09/29/2020 0537   PROT 7.1 12/04/2015 1551   ALBUMIN 3.4 (L) 09/29/2020 0537   ALBUMIN 3.8 12/04/2015 1551   AST 14 (L) 09/29/2020 0537   AST 17 12/04/2015 1551   ALT  18 09/29/2020 0537   ALT 25 12/04/2015 1551   ALKPHOS 33 (L) 09/29/2020 0537   ALKPHOS 45 12/04/2015 1551   BILITOT 1.1 09/29/2020 0537   BILITOT 0.43 12/04/2015 1551   GFRNONAA >60 10/01/2020 0419   Lipase     Component Value Date/Time   LIPASE 39 09/28/2020 0757       Studies/Results: No results found.  Anti-infectives: Anti-infectives (From admission, onward)   Start     Dose/Rate Route Frequency Ordered Stop   09/29/20 1300  metroNIDAZOLE (FLAGYL) IVPB 500 mg        500 mg 100 mL/hr over 60 Minutes Intravenous Every 8 hours 09/29/20 1110 10/04/20 1259   09/29/20 0045  meropenem (MERREM) 1 g in sodium chloride 0.9 % 100 mL IVPB        1 g 200 mL/hr over 30 Minutes Intravenous Every 8 hours 09/28/20 2358 10/04/20 1109   09/28/20 1030  levofloxacin (LEVAQUIN) IVPB 750 mg  Status:  Discontinued        750 mg 100 mL/hr over 90 Minutes Intravenous Every 24 hours 09/28/20 1018 09/28/20 2343   09/28/20 1030  metroNIDAZOLE (FLAGYL) IVPB 500 mg  500 mg 100 mL/hr over 60 Minutes Intravenous  Once 09/28/20 1018 09/28/20 1211       Assessment/Plan POD 3, s/p lap appy for perforated gangrenous appendicitis -cont IV abx therapy -fevers overnight.  Continue to monitor.  High risk for post op abscess -ileus seems to be improving, CLD today -mobilize -pulm toilet given prior history of lung resection, pulls close to 1000  FEN - CLD/IVFs 75cc VTE - lovenox ID - Merrem/Flagyl   LOS: 3 days    Sean [name] , [facility] 10/01/2020, 9:26 AM Please see Amion for pager number during day hours 7:00am-4:30pm or 7:00am -11:30am on weekends

## 2020-10-02 LAB — CBC
HCT: 45.9 % (ref 39.0–52.0)
Hemoglobin: 15.1 g/dL (ref 13.0–17.0)
MCH: 29.3 pg (ref 26.0–34.0)
MCHC: 32.9 g/dL (ref 30.0–36.0)
MCV: 89 fL (ref 80.0–100.0)
Platelets: 258 K/uL (ref 150–400)
RBC: 5.16 MIL/uL (ref 4.22–5.81)
RDW: 12.7 % (ref 11.5–15.5)
WBC: 9.7 K/uL (ref 4.0–10.5)
nRBC: 0 % (ref 0.0–0.2)

## 2020-10-02 LAB — BASIC METABOLIC PANEL
Anion gap: 12 (ref 5–15)
BUN: 16 mg/dL (ref 6–20)
CO2: 25 mmol/L (ref 22–32)
Calcium: 8.2 mg/dL — ABNORMAL LOW (ref 8.9–10.3)
Chloride: 99 mmol/L (ref 98–111)
Creatinine, Ser: 0.88 mg/dL (ref 0.61–1.24)
GFR, Estimated: >60 mL/min (ref >60)
Glucose, Bld: 123 mg/dL — ABNORMAL HIGH (ref 70–99)
Potassium: 3.9 mmol/L (ref 3.5–5.1)
Sodium: 136 mmol/L (ref 135–145)

## 2020-10-02 MED FILL — Morphine Sulfate Inj 4 MG/ML: INTRAMUSCULAR | Qty: 1 | Status: AC

## 2020-10-02 NOTE — Progress Notes (Signed)
4 Days Post-Op  Subjective: Doesn't feel well today.  Very bloated with clear liquids.  Still passing flatus and having BMs, just had another this am.  Feels very weak and tired.  ROS: See above, otherwise other systems negative  Objective: Vital signs in last 24 hours: Temp:  [97.7 F (36.5 C)-98.6 F (37 C)] 97.7 F (36.5 C) (03/15 0604) Pulse Rate:  [59-77] 77 (03/15 0604) Resp:  [18] 18 (03/15 0604) BP: (125-131)/(79-89) 131/89 (03/15 0604) SpO2:  [95 %-100 %] 100 % (03/15 0604) Last BM Date: 10/01/20  Intake/Output from previous day: 03/14 0701 - 03/15 0700 In: 3814.1 [P.O.:1800; I.V.:1601.1; IV Piggyback:413] Out: 0  Intake/Output this shift: No intake/output data recorded.  PE: Heart: regular Lungs: CTAB Abd: more bloated today, but still with active BS, tender as expected, greatest on right side of abdomen as also expected.  Incisions c/d/i  Lab Results:  Recent Labs    10/01/20 0419  WBC 9.1  HGB 13.2  HCT 40.0  PLT 185   BMET Recent Labs    10/01/20 0419  K 3.5  CREATININE 0.82   PT/INR No results for input(s): LABPROT, INR in the last 72 hours. CMP     Component Value Date/Time   NA 137 09/29/2020 0537   NA 139 12/04/2015 1551   K 3.5 10/01/2020 0419   K 3.9 12/04/2015 1551   CL 103 09/29/2020 0537   CL 104 10/06/2012 1546   CO2 25 09/29/2020 0537   CO2 26 12/04/2015 1551   GLUCOSE 162 (H) 09/29/2020 0537   GLUCOSE 88 12/04/2015 1551   GLUCOSE 117 (H) 10/06/2012 1546   BUN 11 09/29/2020 0537   BUN 13.8 12/04/2015 1551   CREATININE 0.82 10/01/2020 0419   CREATININE 0.9 12/04/2015 1551   CALCIUM 8.1 (L) 09/29/2020 0537   CALCIUM 8.9 12/04/2015 1551   PROT 6.4 (L) 09/29/2020 0537   PROT 7.1 12/04/2015 1551   ALBUMIN 3.4 (L) 09/29/2020 0537   ALBUMIN 3.8 12/04/2015 1551   AST 14 (L) 09/29/2020 0537   AST 17 12/04/2015 1551   ALT 18 09/29/2020 0537   ALT 25 12/04/2015 1551   ALKPHOS 33 (L) 09/29/2020 0537   ALKPHOS 45 12/04/2015  1551   BILITOT 1.1 09/29/2020 0537   BILITOT 0.43 12/04/2015 1551   GFRNONAA >60 10/01/2020 0419   Lipase     Component Value Date/Time   LIPASE 39 09/28/2020 0757       Studies/Results: No results found.  Anti-infectives: Anti-infectives (From admission, onward)   Start     Dose/Rate Route Frequency Ordered Stop   09/29/20 1300  metroNIDAZOLE (FLAGYL) IVPB 500 mg  Status:  Discontinued        500 mg 100 mL/hr over 60 Minutes Intravenous Every 8 hours 09/29/20 1110 10/01/20 1108   09/29/20 0045  meropenem (MERREM) 1 g in sodium chloride 0.9 % 100 mL IVPB        1 g 200 mL/hr over 30 Minutes Intravenous Every 8 hours 09/28/20 2358 10/04/20 1109   09/28/20 1030  levofloxacin (LEVAQUIN) IVPB 750 mg  Status:  Discontinued        750 mg 100 mL/hr over 90 Minutes Intravenous Every 24 hours 09/28/20 1018 09/28/20 2343   09/28/20 1030  metroNIDAZOLE (FLAGYL) IVPB 500 mg        500 mg 100 mL/hr over 60 Minutes Intravenous  Once 09/28/20 1018 09/28/20 1211       Assessment/Plan POD 4, s/p lap appy  for perforated gangrenous appendicitis -cont IV abx therapy -fevers improved, Tmax 100.5 overnight -despite bowel function, still seems to have an ileus as he is not tolerating his clear liquids well.  NPO x sips and chips for today.  Reassured patient that this can be normal post op issue.  He is not having any nausea or vomiting, so no needs for NGT at this time. -CT scan tomorrow am to rule out abscess, complication. -recheck labs today and in am -mobilize -pulm toilet given prior history of lung resection, pulls close to 1000  FEN - NPO, sips and chips/IVFs 75cc VTE - lovenox ID - Merrem   LOS: 4 days    Sean [name] , [facility] 10/02/2020, 9:04 AM Please see Amion for pager number during day hours 7:00am-4:30pm or 7:00am -11:30am on weekends

## 2020-10-03 ENCOUNTER — Encounter (HOSPITAL_COMMUNITY): Payer: Self-pay

## 2020-10-03 ENCOUNTER — Inpatient Hospital Stay (HOSPITAL_COMMUNITY): Payer: 59

## 2020-10-03 LAB — BASIC METABOLIC PANEL
Anion gap: 12 (ref 5–15)
BUN: 23 mg/dL — ABNORMAL HIGH (ref 6–20)
CO2: 24 mmol/L (ref 22–32)
Calcium: 8.5 mg/dL — ABNORMAL LOW (ref 8.9–10.3)
Chloride: 102 mmol/L (ref 98–111)
Creatinine, Ser: 0.86 mg/dL (ref 0.61–1.24)
GFR, Estimated: >60 mL/min (ref >60)
Glucose, Bld: 157 mg/dL — ABNORMAL HIGH (ref 70–99)
Potassium: 4.3 mmol/L (ref 3.5–5.1)
Sodium: 138 mmol/L (ref 135–145)

## 2020-10-03 LAB — CBC
HCT: 46.9 % (ref 39.0–52.0)
Hemoglobin: 15.9 g/dL (ref 13.0–17.0)
MCH: 29.3 pg (ref 26.0–34.0)
MCHC: 33.9 g/dL (ref 30.0–36.0)
MCV: 86.4 fL (ref 80.0–100.0)
Platelets: 271 10*3/uL (ref 150–400)
RBC: 5.43 MIL/uL (ref 4.22–5.81)
RDW: 12.7 % (ref 11.5–15.5)
WBC: 10.9 10*3/uL — ABNORMAL HIGH (ref 4.0–10.5)
nRBC: 0 % (ref 0.0–0.2)

## 2020-10-03 LAB — PROTIME-INR
INR: 1 (ref 0.8–1.2)
Prothrombin Time: 13.2 seconds (ref 11.4–15.2)

## 2020-10-03 LAB — SURGICAL PATHOLOGY

## 2020-10-03 MED ORDER — IOHEXOL 300 MG/ML  SOLN
75.0000 mL | Freq: Once | INTRAMUSCULAR | Status: AC | PRN
  Administered 2020-10-03: 75 mL via INTRAVENOUS

## 2020-10-03 MED ORDER — MIDAZOLAM HCL 2 MG/2ML IJ SOLN
INTRAMUSCULAR | Status: AC | PRN
  Administered 2020-10-03: 2 mg via INTRAVENOUS
  Administered 2020-10-03: 1 mg via INTRAVENOUS

## 2020-10-03 MED ORDER — FENTANYL CITRATE (PF) 100 MCG/2ML IJ SOLN
INTRAMUSCULAR | Status: AC
  Filled 2020-10-03: qty 2

## 2020-10-03 MED ORDER — LIDOCAINE HCL (PF) 1 % IJ SOLN
INTRAMUSCULAR | Status: AC | PRN
  Administered 2020-10-03: 10 mL

## 2020-10-03 MED ORDER — MORPHINE SULFATE (PF) 2 MG/ML IV SOLN
1.0000 mg | INTRAVENOUS | Status: DC | PRN
  Administered 2020-10-03 – 2020-10-04 (×10): 2 mg via INTRAVENOUS
  Filled 2020-10-03 (×9): qty 1

## 2020-10-03 MED ORDER — MIDAZOLAM HCL 2 MG/2ML IJ SOLN
INTRAMUSCULAR | Status: AC
  Filled 2020-10-03: qty 4

## 2020-10-03 MED ORDER — SODIUM CHLORIDE 0.9% FLUSH
5.0000 mL | Freq: Three times a day (TID) | INTRAVENOUS | Status: DC
  Administered 2020-10-03 – 2020-10-08 (×12): 5 mL

## 2020-10-03 MED ORDER — IOHEXOL 300 MG/ML  SOLN
100.0000 mL | Freq: Once | INTRAMUSCULAR | Status: AC | PRN
  Administered 2020-10-03: 100 mL via INTRAVENOUS

## 2020-10-03 MED ORDER — ACETAMINOPHEN 10 MG/ML IV SOLN
1000.0000 mg | Freq: Four times a day (QID) | INTRAVENOUS | Status: AC
  Administered 2020-10-03 (×4): 1000 mg via INTRAVENOUS
  Filled 2020-10-03 (×4): qty 100

## 2020-10-03 MED ORDER — LIDOCAINE HCL URETHRAL/MUCOSAL 2 % EX GEL
1.0000 "application " | Freq: Once | CUTANEOUS | Status: DC
  Filled 2020-10-03: qty 5

## 2020-10-03 MED ORDER — FENTANYL CITRATE (PF) 100 MCG/2ML IJ SOLN
INTRAMUSCULAR | Status: AC | PRN
  Administered 2020-10-03 (×2): 50 ug via INTRAVENOUS

## 2020-10-03 MED ORDER — LIDOCAINE VISCOUS HCL 2 % MT SOLN
15.0000 mL | Freq: Once | OROMUCOSAL | Status: AC
  Administered 2020-10-03: 15 mL via OROMUCOSAL
  Filled 2020-10-03: qty 15

## 2020-10-03 MED ORDER — HYDRALAZINE HCL 20 MG/ML IJ SOLN: 10.0000 mg | Freq: Four times a day (QID) | INTRAMUSCULAR | Status: DC | PRN

## 2020-10-03 NOTE — Progress Notes (Signed)
5 Days Post-Op  Subjective: Doesn't feel well today.  Still feels very bloated.  Vomited once overnight about 400cc.  No further bowel function today.  ROS: See above, otherwise other systems negative  Objective: Vital signs in last 24 hours: Temp:  [97.7 F (36.5 C)-98.3 F (36.8 C)] 98.3 F (36.8 C) (03/16 0506) Pulse Rate:  [64-89] 73 (03/16 0506) Resp:  [17-18] 18 (03/16 0506) BP: (132-141)/(82-97) 141/82 (03/16 0506) SpO2:  [94 %-100 %] 96 % (03/16 0506) Last BM Date: 10/01/20  Intake/Output from previous day: 03/15 0701 - 03/16 0700 In: 2017.1 [I.V.:1717.1; IV Piggyback:300] Out: 400 [Emesis/NG output:400] Intake/Output this shift: No intake/output data recorded.  PE: Heart: regular Lungs: CTAB Abd: remains distended with tympany, absent BS today, incisions c/d/i, tender on right side of abdomen.  Lab Results:  Recent Labs    10/02/20 0914 10/03/20 0442  WBC 9.7 10.9*  HGB 15.1 15.9  HCT 45.9 46.9  PLT 258 271   BMET Recent Labs    10/02/20 0914 10/03/20 0442  NA 136 138  K 3.9 4.3  CL 99 102  CO2 25 24  GLUCOSE 123* 157*  BUN 16 23*  CREATININE 0.88 0.86  CALCIUM 8.2* 8.5*   PT/INR No results for input(s): LABPROT, INR in the last 72 hours. CMP     Component Value Date/Time   NA 138 10/03/2020 0442   NA 139 12/04/2015 1551   K 4.3 10/03/2020 0442   K 3.9 12/04/2015 1551   CL 102 10/03/2020 0442   CL 104 10/06/2012 1546   CO2 24 10/03/2020 0442   CO2 26 12/04/2015 1551   GLUCOSE 157 (H) 10/03/2020 0442   GLUCOSE 88 12/04/2015 1551   GLUCOSE 117 (H) 10/06/2012 1546   BUN 23 (H) 10/03/2020 0442   BUN 13.8 12/04/2015 1551   CREATININE 0.86 10/03/2020 0442   CREATININE 0.9 12/04/2015 1551   CALCIUM 8.5 (L) 10/03/2020 0442   CALCIUM 8.9 12/04/2015 1551   PROT 6.4 (L) 09/29/2020 0537   PROT 7.1 12/04/2015 1551   ALBUMIN 3.4 (L) 09/29/2020 0537   ALBUMIN 3.8 12/04/2015 1551   AST 14 (L) 09/29/2020 0537   AST 17 12/04/2015 1551    ALT 18 09/29/2020 0537   ALT 25 12/04/2015 1551   ALKPHOS 33 (L) 09/29/2020 0537   ALKPHOS 45 12/04/2015 1551   BILITOT 1.1 09/29/2020 0537   BILITOT 0.43 12/04/2015 1551   GFRNONAA >60 10/03/2020 0442   Lipase     Component Value Date/Time   LIPASE 39 09/28/2020 0757       Studies/Results: No results found.  Anti-infectives: Anti-infectives (From admission, onward)   Start     Dose/Rate Route Frequency Ordered Stop   09/29/20 1300  metroNIDAZOLE (FLAGYL) IVPB 500 mg  Status:  Discontinued        500 mg 100 mL/hr over 60 Minutes Intravenous Every 8 hours 09/29/20 1110 10/01/20 1108   09/29/20 0045  meropenem (MERREM) 1 g in sodium chloride 0.9 % 100 mL IVPB        1 g 200 mL/hr over 30 Minutes Intravenous Every 8 hours 09/28/20 2358 10/04/20 1109   09/28/20 1030  levofloxacin (LEVAQUIN) IVPB 750 mg  Status:  Discontinued        750 mg 100 mL/hr over 90 Minutes Intravenous Every 24 hours 09/28/20 1018 09/28/20 2343   09/28/20 1030  metroNIDAZOLE (FLAGYL) IVPB 500 mg        500 mg 100 mL/hr over 60  Minutes Intravenous  Once 09/28/20 1018 09/28/20 1211       Assessment/Plan POD 5, s/p lap appy for perforated gangrenous appendicitis -cont IV abx therapy -fever curve resolved to normal -CT not read yet but appears to have a RLQ fluid collection along with an ileus -will have IR eval for drain placement  -will place an NGT for decompression -mobilize/pulm toilet -follow labs  FEN - NPO/NGT/ice chips/IVFs 75cc VTE - lovenox ID - Merrem   LOS: 5 days    Henreitta Cea , [facility] 10/03/2020, 9:40 AM Please see Amion for pager number during day hours 7:00am-4:30pm or 7:00am -11:30am on weekends

## 2020-10-03 NOTE — Procedures (Signed)
Interventional Radiology Procedure Note  Procedure: Image guided drain placement, RLQ.  60F pigtail drain.  Complications: None  EBL: None Sample: Culture sent (20cc's), sample for cytology (30cc's)  Recommendations: - Routine drain care, with sterile flushes, record output - follow up Cx - follow up cytology - routine wound care  Signed,  Dulcy Fanny. Earleen Newport, DO

## 2020-10-03 NOTE — Progress Notes (Signed)
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  Dr. Brantley Stage notified who passed along to me since I am running the inpatient service & colorectal expertise.  I will add on to discuss at GI tumor board but suspect patient would benefit from right colectomy resection for better margins and lymph node evaluation..  In the setting of ileus, infection, malnutrition; this is not an ideal time to do this.  Try and see if we can wait a few weeks.  We will discuss with my colorectal partners as well.  Check to complete staging - CEA - CT chest   Pathology below:  ################################################  SURGICAL PATHOLOGY  CASE: [id number]  PATIENT: Sean [name]  Surgical Pathology Report      Clinical History: acute appendicitis      FINAL MICROSCOPIC DIAGNOSIS:   A. APPENDIX, APPENDECTOMY:  - Goblet cell adenocarcinoma, low grade.  - Tumor focally invades through muscularis propria.  - Resection margin is positive.  - Acute appendicitis with serositis.  - See oncology table.   ONCOLOGY TABLE:   APPENDIX, CARCINOMA: Resection   Procedure: Appendectomy  Tumor Size: See comment  Histologic Type: Goblet cell adenocarcinoma  Histologic Grade: G1, well differentiated  Tumor Deposits: Not identified  Tumor Extension: Tumor invades through muscularis propria into subserosa  focally  Lymphovascular Invasion: Not identified  Margins:    Margin Status for Invasive Carcinoma: Present at proximal inked  margin.    Margin Status for Non-Invasive Tumor and Mucin: Not applicable  Regional Lymph Nodes: Not applicable (no lymph nodes submitted or found)  Distant Metastasis:   Distant Site(s) Involved: Not applicable  Pathologic Stage Classification (pTNM, AJCC 8th Edition): pT3, pN not  assigned  Ancillary Studies: Can be performed upon request  Representative Tumor Block: A2  Comment(s): The exact size of the tumor is  difficult to estimate given  the lack of [name] tumor, but is estimated to be at least 2.4 cm. The  tumor is positive for CDX-2, mucicarmine, and synaptophysin (focal). Dr.  Vic Ripper has reviewed the case. Dr. Brantley Stage was notified on 10/03/2020.   (v4.2.0.0)     Bryson Palen DESCRIPTION:   Specimen: Focally disrupted appendix with attached soft to rubbery fatty  tissue, received fresh.  Size: 4.2 cm in length, ranges from 0.8 to 0.6 cm in diameter.  Serosa: There is scattered exudate.  Wall: Focally disrupted in the mid segment.  Lumen: Pinpoint throughout  Block Summary:  Representative sections in one block, margin and disrupted area included  (margin inked black)  SW 10/01/2020   Remainder of appendix is submitted per Dr. Tresa Moore in blocks 2, 3. SW  10/02/2020      Final Diagnosis performed by Vicente Males, MD.  Electronically signed  10/03/2020  Technical and / or Professional components performed at [facility], [place] [address], [place], [place] [address].  Immunohistochemistry Technical component (if applicable) was performed  at [facility]. [address] [place],  [place], [place] [address].  IMMUNOHISTOCHEMISTRY DISCLAIMER (if applicable):  Some of these immunohistochemical stains may have been developed and the  performance characteristics determine by [facility]. Some  may not have been cleared or approved by the U.S. Food and Drug  Administration. The FDA has determined that such clearance or approval  is not necessary. This test is used for clinical purposes. It should not  be regarded as investigational or for research. This laboratory is  certified under the Clinical Laboratory Improvement  Amendments of 1988  (CLIA-88) as qualified to perform high complexity clinical laboratory  testing. The controls stained appropriately.    #####################################  Adin Hector, MD, FACS,  MASCRS  Gastrointestinal and Minimally Invasive Surgery  [facility] [address] [place], [place] [address] [phone] Fax [phone] Main/Paging  CONTACT INFORMATION: Weekday (9AM-5PM) concerns: Call [facility] main office at [phone] Weeknight (5PM-9AM) or Weekend/Holiday concerns: Check [email or web address] for General Surgery [facility] coverage (Please, do not use SecureChat as it is not reliable communication to operating surgeons for immediate patient care)

## 2020-10-03 NOTE — Consult Note (Signed)
Chief Complaint: Patient was seen in consultation today for intra-abdominal fluid collection  Referring Physician(s): Dr. Johney Maine  Supervising Physician: Corrie Mckusick  Patient Status: [facility] - In-pt  History of Present Illness: Sean [name] is a 60 y.o. male with past medical history of HLD, metastatic malignant melanoma in remission since 1998, admitted with abdominal pain, constipation, diarrhea found to have appendicitis.  Patient was taken for laparoscopic appendectomy at which time it was discovered there was a small perforation of the appendix with significant inflammation. The appendix was removed and he was taken for recovery which has been prolonged due to ileus and abdominal pain. Initially with post-op fevers which improved and WBC has remained stable, however patient clinically not improving.  CT Abdomen Pelvis 3/16 showed: 1. Interval appendectomy. Gas-containing 10.0 x 3.6 x 4.9 cm fluid collection in the appendectomy bed, compatible with abscess. 2. Small 3.8 x 2.2 x 2.0 cm fluid collection in the midline deep pelvis between the bladder and rectum with mildly thickened wall, new, suspicious for a small abscess. 3. New small 2.2 x 1.8 x 2.2 cm fluid collection associated with chronic small left periumbilical hernia, potentially a small abscess. No bowel loops within this periumbilical hernia. 4. New prominent dilatation of multiple fluid-filled proximal and mid small bowel loops. No discrete small bowel caliber transition. Findings are presumably due to severe postoperative adynamic ileus. A few mildly thickened right lower quadrant small bowel loops are probably reactive. 5. Aortic Atherosclerosis (ICD10-I70.0).  IR consulted for aspiration and drainage of intra-abdominal fluid collection.  Case reviewed by Dr. Earleen Newport who approves patient for procedure today.    Past Medical History:  Diagnosis Date  . Asymmetrical sensorineural hearing loss    L>>R: ENT eval  04/2018.  Pt declined MRI brain at that time.  Marland Kitchen History of adenomatous polyp of colon 07/2016  . History of vitamin D deficiency    still present as of 09/2017.  High dose vit D started 10/16/17.  Increased dose to 50K twice per week 02/18/18.  Marland Kitchen Hyperlipidemia    Mild, never meds  . Metastatic malignant melanoma ([place]) 1989   Recurrence L upper arm 1993.  Then left back and LUL lung recurrence 1995-resected.  Brain lesion recurrence 1996-resected.  RLL lung lesion 1996--chemo & then resection.  Small bowel recurrence resected 1997, then mesenteric lesion resected 1998.  No sign of recurrence since 1998.  Released by onc/Dr. Magrinot as of 12/2015 (needs annual dermatologist exam and annual ophth exam).  . Prediabetes    A1c 5.9% 12/2014.  6.2% 09/2017. 6.3% 04/2019.    Past Surgical History:  Procedure Laterality Date  . BRAIN SURGERY     Removal of melanoma met  . COLONOSCOPY  2009; 07/2016   Dr. Wynetta Emery at [place]; next colonoscopy due 07/2019.  Marland Kitchen COLONOSCOPY WITH PROPOFOL N/A 08/18/2016   Tubular adenoma.  Recall 3 yrs.  Procedure: COLONOSCOPY WITH PROPOFOL;  Surgeon: Garlan Fair, MD;  Location: [facility] ENDOSCOPY;  Service: Endoscopy;  Laterality: N/A;  . LAPAROSCOPIC APPENDECTOMY N/A 09/28/2020   Procedure: APPENDECTOMY LAPAROSCOPIC;  Surgeon: Erroll Luna, MD;  Location: [facility] ORS;  Service: General;  Laterality: N/A;  . LUNG SURGERY     "              "          "        "  . SMALL INTESTINE SURGERY     "               "         "        "  Allergies: Aspirin and Penicillins  Medications: Prior to Admission medications   Medication Sig Start Date End Date Taking? Authorizing Provider  GLUCOSAMINE-CHONDROITIN PO Take 2,000 Units by mouth.   Yes [provider]  predniSONE (DELTASONE) 20 MG tablet 3 tabs daily for 1 week then 2 tabs daily for 1 week and then 1 tab daily. 08/07/20  Yes Shelda Pal, DO     Family History  Problem Relation Age of Onset  . Cancer  Mother        breast  . Alcohol abuse Father     Social History   Socioeconomic History  . Marital status: Married    Spouse name: Not on file  . Number of children: 2  . Years of education: Not on file  . Highest education level: Not on file  Occupational History  . Not on file  Tobacco Use  . Smoking status: Never Smoker  . Smokeless tobacco: Former Network engineer  . Vaping Use: Never used  Substance and Sexual Activity  . Alcohol use: Yes    Comment: occasional  . Drug use: No  . Sexual activity: Yes  Other Topics Concern  . Not on file  Social History Narrative   Married, 2 sons.   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   No T/A/Ds.   Diet ok except fast foods.   Social Determinants of Health   Financial Resource Strain: Not on file  Food Insecurity: Not on file  Transportation Needs: Not on file  Physical Activity: Not on file  Stress: Not on file  Social Connections: Not on file     Review of Systems: A 12 point ROS discussed and pertinent positives are indicated in the HPI above.  All other systems are negative.  Review of Systems  Constitutional: Positive for fatigue. Negative for fever.  Respiratory: Negative for cough and shortness of breath.   Cardiovascular: Negative for chest pain.  Gastrointestinal: Positive for abdominal distention, abdominal pain and nausea. Negative for constipation, diarrhea and vomiting.  Musculoskeletal: Negative for back pain.  Psychiatric/Behavioral: Negative for behavioral problems and confusion.    Vital Signs: BP (!) 141/82 (BP Location: Right Arm)   Pulse 73   Temp 98.3 F (36.8 C) (Oral)   Resp 18   Ht 5' 8"  (1.727 m)   Wt 195 lb 1.7 oz (88.5 kg)   SpO2 96%   BMI 29.67 kg/m   Physical Exam Vitals and nursing note reviewed.  Constitutional:      General: He is not in acute distress.    Appearance: He is well-developed. He is not ill-appearing.  Cardiovascular:     Rate and Rhythm:  Normal rate and regular rhythm.  Pulmonary:     Effort: Pulmonary effort is normal.     Breath sounds: Normal breath sounds.  Abdominal:     General: There is distension.     Tenderness: There is generalized abdominal tenderness.  Skin:    General: Skin is warm and dry.  Neurological:     General: No focal deficit present.     Mental Status: He is alert and oriented to person, place, and time.  Psychiatric:        Mood and Affect: Mood normal.        Behavior: Behavior normal.      MD Evaluation Airway: WNL Heart: WNL Abdomen: WNL Chest/ Lungs: WNL ASA  Classification: 3 Mallampati/Airway Score: Two   Imaging: CT ABDOMEN  PELVIS W CONTRAST  Result Date: 10/03/2020 CLINICAL DATA:  Inpatient. Status post appendectomy 09/28/2020. Postoperative abdominal pain, bloating and fever. 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 EXAM: CT ABDOMEN AND PELVIS WITH CONTRAST TECHNIQUE: Multidetector CT imaging of the abdomen and pelvis was performed using the standard protocol following bolus administration of intravenous contrast. CONTRAST:  112m OMNIPAQUE IOHEXOL 300 MG/ML  SOLN COMPARISON:  09/28/2020 CT abdomen/pelvis. FINDINGS: Lower chest: No significant pulmonary nodules or acute consolidative airspace disease. Coronary atherosclerosis. Hepatobiliary: Normal liver size. No liver mass. Normal gallbladder with no radiopaque cholelithiasis. No biliary ductal dilatation. Pancreas: Normal, with no mass or duct dilation. Spleen: Normal size. No mass. Adrenals/Urinary Tract: Normal adrenals. No hydronephrosis. Simple 1.7 cm posterior upper right renal cyst. No suspicious renal masses. Normal bladder. Stomach/Bowel: Mildly distended fluid-filled stomach with no gastric wall thickening. New prominent dilatation of multiple fluid-filled proximal and mid small bowel loops up to 5.9 cm diameter. A few mildly thick walled right lower quadrant small bowel loops are noted. No discrete small  bowel caliber transition. No pneumatosis. Interval appendectomy. Gas containing 10.0 x 3.6 x 4.9 cm fluid collection in the appendectomy bed (series 2/image 71). Normal large bowel with no diverticulosis, large bowel wall thickening or pericolonic fat stranding. Vascular/Lymphatic: Atherosclerotic nonaneurysmal abdominal aorta. Patent portal, splenic, hepatic and renal veins. No pathologically enlarged lymph nodes in the abdomen or pelvis. Reproductive: Top normal size prostate. Other: No ascites. Small 3.8 x 2.2 x 2.0 cm fluid collection in midline deep pelvis between the bladder and rectum with mildly thickened wall (series 2/image 89), new. New small 2.2 x 1.8 x 2.2 cm fluid collection associated with chronic small left periumbilical hernia, which does not contain bowel loops. Musculoskeletal: No aggressive appearing focal osseous lesions. Mild thoracolumbar spondylosis. IMPRESSION: 1. Interval appendectomy. Gas-containing 10.0 x 3.6 x 4.9 cm fluid collection in the appendectomy bed, compatible with abscess. 2. Small 3.8 x 2.2 x 2.0 cm fluid collection in the midline deep pelvis between the bladder and rectum with mildly thickened wall, new, suspicious for a small abscess. 3. New small 2.2 x 1.8 x 2.2 cm fluid collection associated with chronic small left periumbilical hernia, potentially a small abscess. No bowel loops within this periumbilical hernia. 4. New prominent dilatation of multiple fluid-filled proximal and mid small bowel loops. No discrete small bowel caliber transition. Findings are presumably due to severe postoperative adynamic ileus. A few mildly thickened right lower quadrant small bowel loops are probably reactive. 5. Aortic Atherosclerosis (ICD10-I70.0). Electronically Signed   By: JIlona SorrelM.D.   On: 10/03/2020 09:38   CT Abdomen Pelvis W Contrast  Result Date: 09/28/2020 CLINICAL DATA:  Right greater than left lower abdominal pain. EXAM: CT ABDOMEN AND PELVIS WITH CONTRAST TECHNIQUE:  Multidetector CT imaging of the abdomen and pelvis was performed using the standard protocol following bolus administration of intravenous contrast. CONTRAST:  1074mOMNIPAQUE IOHEXOL 300 MG/ML  SOLN COMPARISON:  None. FINDINGS: Lower chest: Scarring in the bilateral lower lobes. Normal size heart. No pericardial effusion. Left anterior descending coronary artery calcifications. Hepatobiliary: No suspicious hepatic lesion. Gallbladder is unremarkable. No biliary ductal dilation. Pancreas: Unremarkable Spleen: Unremarkable Adrenals/Urinary Tract: Bilateral adrenal glands are unremarkable. No hydronephrosis. Symmetric bilateral renal enhancement. 1.6 cm right interpolar renal cyst. Urinary bladder is grossly unremarkable for degree of distension. Stomach/Bowel: Stomach is unremarkable. Prominent loops of small bowel with gentle tapering to normal bowel for instance measuring 2.9 cm in the left hemiabdomen on image 63/3. No  evidence of small-bowel obstruction. There is inflammatory stranding along the terminal ileum and cecum. Descending and sigmoid colonic diverticulosis without findings of acute diverticulitis. Appendix: Location: Right lower quadrant on image 63/3 and 50/6 Diameter: 9 mm Appendicolith: Absent Mucosal hyper-enhancement: Present Extraluminal gas: Absent Periappendiceal collection: Absent Vascular/Lymphatic: Aortic atherosclerosis. No enlarged abdominal or pelvic lymph nodes. Reproductive: Prostate is unremarkable. Other: No drainable fluid collection. Small fat containing left ventral hernia. Musculoskeletal: No acute osseous abnormality. IMPRESSION: 1. Acute uncomplicated appendicitis. 2. Colonic diverticulosis without findings of acute diverticulitis. 3. Small fat containing left ventral hernia. 4. Aortic atherosclerosis.  Aortic Atherosclerosis (ICD10-I70.0). These results were called by telephone at the time of interpretation on 09/28/2020 at 9:44 am to provider [facility] , who verbally  acknowledged these results. Electronically Signed   By: Dahlia Bailiff MD   On: 09/28/2020 09:44   CT L-SPINE NO CHARGE  Result Date: 09/28/2020 CLINICAL DATA:  Low abdominal pain, right greater than left. EXAM: CT LUMBAR SPINE WITHOUT CONTRAST TECHNIQUE: Multidetector CT imaging of the lumbar spine was performed without intravenous contrast administration. Multiplanar CT image reconstructions were also generated. COMPARISON:  None. FINDINGS: Segmentation: 5 lumbar type vertebrae. Alignment: Normal. Vertebrae: No acute fracture or aggressive osseous lesion. Paraspinal and other soft tissues: Abdominal aortic atherosclerosis. No acute paraspinal abnormality. 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 Disc levels: Disc spaces: Mild degenerative disease with disc height loss at T11-12 and T12-L1. T12-L1: No disc protrusion, foraminal stenosis or central canal stenosis. L1-L2: No disc protrusion, foraminal stenosis or central canal stenosis. L2-L3: Mild broad-based disc bulge. Mild bilateral facet arthropathy. No foraminal stenosis. L3-L4: Broad-based disc bulge. Mild bilateral facet arthropathy. No foraminal stenosis. L4-L5: Mild broad-based disc bulge with a left paracentral disc protrusion. Severe bilateral facet arthropathy. Mild spinal stenosis. L5-S1: No disc protrusion, foraminal stenosis or central canal stenosis. Moderate facet arthropathy. IMPRESSION: 1. No acute osseous injury of the lumbar spine. 2. Lumbar spine spondylosis as described above. 3. Aortic Atherosclerosis (ICD10-I70.0). Electronically Signed   By: Kathreen Devoid   On: 09/28/2020 09:33   DG Abd Portable 1V  Result Date: 10/03/2020 CLINICAL DATA:  Nasogastric tube placement EXAM: PORTABLE ABDOMEN - 1 VIEW COMPARISON:  CT abdomen and pelvis October 03, 2020 FINDINGS: Nasogastric tube tip is in the proximal stomach. The side port is not well seen but is likely above the gastroesophageal junction. Loops of dilated bowel remain  in the upper abdomen concerning for persistent bowel obstruction. No free air evident. Lung bases clear. IMPRESSION: Nasogastric tube tip in proximal stomach. Advise advancing nasogastric tube 5-6 cm. Findings in the visualized abdomen concerning for persistent bowel obstruction. No free air appreciable. Electronically Signed   By: Lowella Grip III M.D.   On: 10/03/2020 11:18    Labs:  CBC: Recent Labs    09/29/20 0537 10/01/20 0419 10/02/20 0914 10/03/20 0442  WBC 16.1* 9.1 9.7 10.9*  HGB 13.6 13.2 15.1 15.9  HCT 41.2 40.0 45.9 46.9  PLT 179 185 258 271    COAGS: Recent Labs    10/03/20 0948  INR 1.0    BMP: Recent Labs    09/28/20 0757 09/29/20 0537 10/01/20 0419 10/02/20 0914 10/03/20 0442  NA 136 137  --  136 138  K 3.9 4.0 3.5 3.9 4.3  CL 100 103  --  99 102  CO2 25 25  --  25 24  GLUCOSE 147* 162*  --  123* 157*  BUN 14 11  --  16  23*  CALCIUM 9.0 8.1*  --  8.2* 8.5*  CREATININE 0.92 0.81 0.82 0.88 0.86  GFRNONAA >60 >60 >60 >60 >60    LIVER FUNCTION TESTS: Recent Labs    07/16/20 0917 09/28/20 0757 09/29/20 0537  BILITOT 0.4 1.1 1.1  AST 15 23 14*  ALT 22 27 18   ALKPHOS 46 41 33*  PROT 6.7 7.7 6.4*  ALBUMIN 4.1 4.5 3.4*    TUMOR MARKERS: No results for input(s): AFPTM, CEA, CA199, CHROMGRNA in the last 8760 hours.  Assessment and Plan: Intra-abdominal fluid collection s/p appendectomy 09/28/20 Patient s/p appendectomy, now with ileus and RLQ abscess identified by CT imaging today.  IR consulted for drain placement.  Patient now with NGT in place.  Discussed procedure with patient and sister at bedside.  They are agreeable to drain placement.  Not currently on blood thinners.   Risks and benefits discussed with the patient including bleeding, infection, damage to adjacent structures, bowel perforation/fistula connection, and sepsis.  All of the patient's questions were answered, patient is agreeable to proceed. Consent signed and in  chart.  Thank you for this interesting consult.  I greatly enjoyed meeting Sean [name] and look forward to participating in their care.  A copy of this report was sent to the requesting provider on this date.  Electronically Signed: Docia Barrier, PA 10/03/2020, 12:54 PM   I spent a total of 40 Minutes    in face to face in clinical consultation, greater than 50% of which was counseling/coordinating care for intra-abdominal fluid collection.

## 2020-10-03 NOTE — Progress Notes (Signed)
Patient with emesis x1 at approximately 0200am. Patient states he is feeling worse than he did previously, but declines PRN medications at this time. States that he hasn't had anything to eat so he doesn't want to keep taking medicinations. Writer in touch with on call general surgery Dr Rush Farmer. He has advised if the patient experiences another episode of emesis, that a NG tube should be inserted. Patient awaiting CT scan this morning as well. Remains afebrile at this time. Writer will continue to monitor and report any new abnormal findings.

## 2020-10-04 ENCOUNTER — Inpatient Hospital Stay: Payer: Self-pay

## 2020-10-04 ENCOUNTER — Inpatient Hospital Stay (HOSPITAL_COMMUNITY): Payer: 59

## 2020-10-04 LAB — CBC
HCT: 43.2 % (ref 39.0–52.0)
Hemoglobin: 14 g/dL (ref 13.0–17.0)
MCH: 29.1 pg (ref 26.0–34.0)
MCHC: 32.4 g/dL (ref 30.0–36.0)
MCV: 89.8 fL (ref 80.0–100.0)
Platelets: 332 10*3/uL (ref 150–400)
RBC: 4.81 MIL/uL (ref 4.22–5.81)
RDW: 12.7 % (ref 11.5–15.5)
WBC: 10.7 10*3/uL — ABNORMAL HIGH (ref 4.0–10.5)
nRBC: 0 % (ref 0.0–0.2)

## 2020-10-04 LAB — BASIC METABOLIC PANEL
Anion gap: 11 (ref 5–15)
BUN: 23 mg/dL — ABNORMAL HIGH (ref 6–20)
CO2: 30 mmol/L (ref 22–32)
Calcium: 8.3 mg/dL — ABNORMAL LOW (ref 8.9–10.3)
Chloride: 101 mmol/L (ref 98–111)
Creatinine, Ser: 0.9 mg/dL (ref 0.61–1.24)
GFR, Estimated: >60 mL/min (ref >60)
Glucose, Bld: 117 mg/dL — ABNORMAL HIGH (ref 70–99)
Potassium: 3.5 mmol/L (ref 3.5–5.1)
Sodium: 142 mmol/L (ref 135–145)

## 2020-10-04 LAB — CYTOLOGY - NON PAP

## 2020-10-04 MED ORDER — SODIUM CHLORIDE 0.9 % IV SOLN
12.5000 mg | Freq: Four times a day (QID) | INTRAVENOUS | Status: DC | PRN
  Filled 2020-10-04: qty 0.5

## 2020-10-04 MED ORDER — SODIUM CHLORIDE 0.9 % IV SOLN
100.0000 mg | INTRAVENOUS | Status: DC
  Administered 2020-10-05 – 2020-10-06 (×2): 100 mg via INTRAVENOUS
  Filled 2020-10-04 (×3): qty 100

## 2020-10-04 MED ORDER — POTASSIUM CHLORIDE 10 MEQ/100ML IV SOLN
10.0000 meq | INTRAVENOUS | Status: AC
  Administered 2020-10-04 (×4): 10 meq via INTRAVENOUS
  Filled 2020-10-04: qty 100

## 2020-10-04 MED ORDER — MEROPENEM 1 G IV SOLR
1.0000 g | Freq: Three times a day (TID) | INTRAVENOUS | Status: DC
  Administered 2020-10-04 – 2020-10-08 (×13): 1 g via INTRAVENOUS
  Filled 2020-10-04 (×16): qty 1

## 2020-10-04 MED ORDER — SODIUM CHLORIDE 0.9 % IV SOLN
200.0000 mg | Freq: Once | INTRAVENOUS | Status: AC
  Administered 2020-10-04: 200 mg via INTRAVENOUS
  Filled 2020-10-04: qty 200

## 2020-10-04 NOTE — Progress Notes (Signed)
Patients NG tube came out of patients nare today. Pt stated I was not allowed to put one in his nose again. Pt stated he will no longer need morphine and that he will walk in halls. Pt has also refused Lovenox and PICC line.

## 2020-10-04 NOTE — Progress Notes (Signed)
6 Days Post-Op  Subjective: Still doesn't feel today.  Has had 3L out of his NGT since placement yesterday.  No further bowel function yet.  Ambulating some.  ROS: See above, otherwise other systems negative  Objective: Vital signs in last 24 hours: Temp:  [97.5 F (36.4 C)-98.4 F (36.9 C)] 97.5 F (36.4 C) (03/17 0504) Pulse Rate:  [60-66] 63 (03/17 0504) Resp:  [14-18] 18 (03/17 0504) BP: (118-148)/(70-89) 130/86 (03/17 0504) SpO2:  [93 %-100 %] 95 % (03/17 0504) Last BM Date: 10/01/20  Intake/Output from previous day: 03/16 0701 - 03/17 0700 In: 1775.3 [P.O.:30; I.V.:1280.6; IV Piggyback:464.7] Out: 3901 [Urine:1901; Emesis/NG output:2000] Intake/Output this shift: Total I/O In: -  Out: 1100 [Urine:100; Emesis/NG output:1000]  PE: Heart: regular Lungs: CTAB Abd: remains distended, but less than yesterday.  NGT cannister just changed and was completed full.  3L output since yesterday.  Incisions c/d/i/  JP drain present with serous fluid.  Lab Results:  Recent Labs    10/03/20 0442 10/04/20 0452  WBC 10.9* 10.7*  HGB 15.9 14.0  HCT 46.9 43.2  PLT 271 332   BMET Recent Labs    10/03/20 0442 10/04/20 0452  NA 138 142  K 4.3 3.5  CL 102 101  CO2 24 30  GLUCOSE 157* 117*  BUN 23* 23*  CREATININE 0.86 0.90  CALCIUM 8.5* 8.3*   PT/INR Recent Labs    10/03/20 0948  LABPROT 13.2  INR 1.0   CMP     Component Value Date/Time   NA 142 10/04/2020 0452   NA 139 12/04/2015 1551   K 3.5 10/04/2020 0452   K 3.9 12/04/2015 1551   CL 101 10/04/2020 0452   CL 104 10/06/2012 1546   CO2 30 10/04/2020 0452   CO2 26 12/04/2015 1551   GLUCOSE 117 (H) 10/04/2020 0452   GLUCOSE 88 12/04/2015 1551   GLUCOSE 117 (H) 10/06/2012 1546   BUN 23 (H) 10/04/2020 0452   BUN 13.8 12/04/2015 1551   CREATININE 0.90 10/04/2020 0452   CREATININE 0.9 12/04/2015 1551   CALCIUM 8.3 (L) 10/04/2020 0452   CALCIUM 8.9 12/04/2015 1551   PROT 6.4 (L) 09/29/2020 0537   PROT  7.1 12/04/2015 1551   ALBUMIN 3.4 (L) 09/29/2020 0537   ALBUMIN 3.8 12/04/2015 1551   AST 14 (L) 09/29/2020 0537   AST 17 12/04/2015 1551   ALT 18 09/29/2020 0537   ALT 25 12/04/2015 1551   ALKPHOS 33 (L) 09/29/2020 0537   ALKPHOS 45 12/04/2015 1551   BILITOT 1.1 09/29/2020 0537   BILITOT 0.43 12/04/2015 1551   GFRNONAA >60 10/04/2020 0452   Lipase     Component Value Date/Time   LIPASE 39 09/28/2020 0757       Studies/Results: CT CHEST W CONTRAST  Result Date: 10/03/2020 CLINICAL DATA:  60 year old male with 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 Staging examination. EXAM: CT CHEST WITH CONTRAST TECHNIQUE: Multidetector CT imaging of the chest was performed during intravenous contrast administration. CONTRAST:  79mL OMNIPAQUE IOHEXOL 300 MG/ML  SOLN COMPARISON:  PET-CT 09/04/2006. FINDINGS: Cardiovascular: Heart size is normal. There is no significant pericardial fluid, thickening or pericardial calcification. There is aortic atherosclerosis, as well as atherosclerosis of the great vessels of the mediastinum and the coronary arteries, including calcified atherosclerotic plaque in the left main, left anterior descending and right coronary arteries. Mediastinum/Nodes: No pathologically enlarged mediastinal or hilar lymph nodes. Nasogastric tube extending into the stomach.  Esophagus is otherwise unremarkable in appearance. No axillary lymphadenopathy. Lungs/Pleura: Postoperative changes of wedge resection in the left lower lobe. Scattered areas of linear scarring are noted in the lower lobes of the lungs bilaterally, similar to prior PET-CT from 2008. Linear scarring in the anterior aspect of the right upper lobe also noted on today's examination. No acute consolidative airspace disease. No pleural effusions. No suspicious appearing pulmonary nodules or masses are noted. Upper Abdomen: Please see separate dictation for  contemporaneously obtained CT the abdomen and pelvis 10/03/2020 for full description of findings beneath the diaphragm. Musculoskeletal: There are no aggressive appearing lytic or blastic lesions noted in the visualized portions of the skeleton. IMPRESSION: 1. No findings to suggest metastatic disease in the thorax. 2. Aortic atherosclerosis, in addition to left main and 2 vessel coronary artery disease. Please note that although the presence of coronary artery calcium documents the presence of coronary artery disease, the severity of this disease and any potential stenosis cannot be assessed on this non-gated CT examination. Assessment for potential risk factor modification, dietary therapy or pharmacologic therapy may be warranted, if clinically indicated. 3. Postoperative changes, support apparatus and additional incidental findings, as above. Aortic Atherosclerosis (ICD10-I70.0). Electronically Signed   By: Vinnie Langton M.D.   On: 10/03/2020 17:19   CT ABDOMEN PELVIS W CONTRAST  Result Date: 10/03/2020 CLINICAL DATA:  Inpatient. Status post appendectomy 09/28/2020. Postoperative abdominal pain, bloating and fever. 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 EXAM: CT ABDOMEN AND PELVIS WITH CONTRAST TECHNIQUE: Multidetector CT imaging of the abdomen and pelvis was performed using the standard protocol following bolus administration of intravenous contrast. CONTRAST:  133mL OMNIPAQUE IOHEXOL 300 MG/ML  SOLN COMPARISON:  09/28/2020 CT abdomen/pelvis. FINDINGS: Lower chest: No significant pulmonary nodules or acute consolidative airspace disease. Coronary atherosclerosis. Hepatobiliary: Normal liver size. No liver mass. Normal gallbladder with no radiopaque cholelithiasis. No biliary ductal dilatation. Pancreas: Normal, with no mass or duct dilation. Spleen: Normal size. No mass. Adrenals/Urinary Tract: Normal adrenals. No hydronephrosis. Simple 1.7 cm posterior upper right renal cyst. No  suspicious renal masses. Normal bladder. Stomach/Bowel: Mildly distended fluid-filled stomach with no gastric wall thickening. New prominent dilatation of multiple fluid-filled proximal and mid small bowel loops up to 5.9 cm diameter. A few mildly thick walled right lower quadrant small bowel loops are noted. No discrete small bowel caliber transition. No pneumatosis. Interval appendectomy. Gas containing 10.0 x 3.6 x 4.9 cm fluid collection in the appendectomy bed (series 2/image 71). Normal large bowel with no diverticulosis, large bowel wall thickening or pericolonic fat stranding. Vascular/Lymphatic: Atherosclerotic nonaneurysmal abdominal aorta. Patent portal, splenic, hepatic and renal veins. No pathologically enlarged lymph nodes in the abdomen or pelvis. Reproductive: Top normal size prostate. Other: No ascites. Small 3.8 x 2.2 x 2.0 cm fluid collection in midline deep pelvis between the bladder and rectum with mildly thickened wall (series 2/image 89), new. New small 2.2 x 1.8 x 2.2 cm fluid collection associated with chronic small left periumbilical hernia, which does not contain bowel loops. Musculoskeletal: No aggressive appearing focal osseous lesions. Mild thoracolumbar spondylosis. IMPRESSION: 1. Interval appendectomy. Gas-containing 10.0 x 3.6 x 4.9 cm fluid collection in the appendectomy bed, compatible with abscess. 2. Small 3.8 x 2.2 x 2.0 cm fluid collection in the midline deep pelvis between the bladder and rectum with mildly thickened wall, new, suspicious for a small abscess. 3. New small 2.2 x 1.8 x 2.2 cm fluid collection associated with chronic small left periumbilical hernia, potentially  a small abscess. No bowel loops within this periumbilical hernia. 4. New prominent dilatation of multiple fluid-filled proximal and mid small bowel loops. No discrete small bowel caliber transition. Findings are presumably due to severe postoperative adynamic ileus. A few mildly thickened right lower  quadrant small bowel loops are probably reactive. 5. Aortic Atherosclerosis (ICD10-I70.0). Electronically Signed   By: Ilona Sorrel M.D.   On: 10/03/2020 09:38   DG Abd Portable 1V  Result Date: 10/03/2020 CLINICAL DATA:  Nasogastric tube placement EXAM: PORTABLE ABDOMEN - 1 VIEW COMPARISON:  CT abdomen and pelvis October 03, 2020 FINDINGS: Nasogastric tube tip is in the proximal stomach. The side port is not well seen but is likely above the gastroesophageal junction. Loops of dilated bowel remain in the upper abdomen concerning for persistent bowel obstruction. No free air evident. Lung bases clear. IMPRESSION: Nasogastric tube tip in proximal stomach. Advise advancing nasogastric tube 5-6 cm. Findings in the visualized abdomen concerning for persistent bowel obstruction. No free air appreciable. Electronically Signed   By: Lowella Grip III M.D.   On: 10/03/2020 11:18   CT IMAGE GUIDED DRAINAGE BY PERCUTANEOUS CATHETER  Result Date: 10/03/2020 INDICATION: 60 year old male with a history appendectomy and right lower quadrant fluid collection. EXAM: CT GUIDED DRAINAGE OF  ABSCESS MEDICATIONS: None ANESTHESIA/SEDATION: 3.0 mg IV Versed 100 mcg IV Fentanyl Moderate Sedation Time:  10 minutes The patient was continuously monitored during the procedure by the interventional radiology nurse under my direct supervision. COMPLICATIONS: None TECHNIQUE: Informed written consent was obtained from the patient after a thorough discussion of the procedural risks, benefits and alternatives. All questions were addressed. Maximal Sterile Barrier Technique was utilized including caps, mask, sterile gowns, sterile gloves, sterile drape, hand hygiene and skin antiseptic. A timeout was performed prior to the initiation of the procedure. PROCEDURE: The operative field was prepped with Chlorhexidine in a sterile fashion, and a sterile drape was applied covering the operative field. A sterile gown and sterile gloves were used  for the procedure. Local anesthesia was provided with 1% Lidocaine. Once the patient was prepped and draped in the usual sterile fashion 1% lidocaine was used for local anesthesia. Using CT guidance, trocar needle was advanced into the fluid collection of the right lower quadrant. Modified Seldinger technique was used to place a 10 [place] drain. 20 cc of thin amber fluid was aspirated for a culture. 30 cc of thin amber fluid was aspirated for cytology sample. The drain was sutured in position and attached to bulb drainage. Patient tolerated the procedure well and remained hemodynamically stable throughout. No complications were encountered and no significant blood loss. FINDINGS: Initial CT demonstrates similar appearance of fluid and gas collection within the right lower quadrant. Ten [place] drain terminates within the fluid collection. Approximately 50 cc of thin amber fluid aspirated with 30 cc sent for cytology and 20 cc sent for culture. IMPRESSION: Status post CT-guided drainage of right lower quadrant fluid collection with sample sent for cytology and culture. Signed, Dulcy Fanny. Dellia Nims, RPVI [facility] Electronically Signed   By: Corrie Mckusick D.O.   On: 10/03/2020 17:27    Anti-infectives: Anti-infectives (From admission, onward)   Start     Dose/Rate Route Frequency Ordered Stop   09/29/20 1300  metroNIDAZOLE (FLAGYL) IVPB 500 mg  Status:  Discontinued        500 mg 100 mL/hr over 60 Minutes Intravenous Every 8 hours 09/29/20 1110 10/01/20 1108   09/29/20 0045  meropenem (MERREM) 1 g in sodium chloride 0.9 % 100 mL IVPB        1 g 200 mL/hr over 30 Minutes Intravenous Every 8 hours 09/28/20 2358 10/04/20 0845   09/28/20 1030  levofloxacin (LEVAQUIN) IVPB 750 mg  Status:  Discontinued        750 mg 100 mL/hr over 90 Minutes Intravenous Every 24 hours 09/28/20 1018 09/28/20 2343   09/28/20 1030  metroNIDAZOLE (FLAGYL) IVPB 500 mg         500 mg 100 mL/hr over 60 Minutes Intravenous  Once 09/28/20 1018 09/28/20 1211       Assessment/Plan POD 6, s/p lap appy for perforated gangrenous appendicitis -cont IV abx therapy -no further fevers -pathology revealed goblet cell adenocarcinoma.  Dr. Jana Hakim has been his oncologist for over 20 years.  We discussed that he doesn't generally treat this type of malignancy, but he would still like to speak with him first.  I have called Dr. Jana Hakim.  He will see him today and then refer him to one of his partners. -we discussed that he will need a right colectomy in about 6 weeks. -for now, he needs to recover from this operation, get over his ileus, and his abscess.  Cultures and cytology pending from drain placement -cont IR drain -mobilize/pulm toilet/IS -K 3.5 today, will give some today given high NGT output.  FEN - NPO/NGT/ice chips/IVFs 75cc VTE - lovenox ID - Merrem   LOS: 6 days    Henreitta Cea , [facility] 10/04/2020, 10:09 AM Please see Amion for pager number during day hours 7:00am-4:30pm or 7:00am -11:30am on weekends

## 2020-10-04 NOTE — Progress Notes (Signed)
Referring Physician(s): Dr. Johney Maine, S.   Supervising Physician: Jacqulynn Cadet  Patient Status:  [facility] - In-pt  Chief Complaint: S/p drain placement for RLQ intra-abdominal fluid collection on 10/03/20  Subjective: Patient sitting on bed, not in acute distress. States that he is doing ok.  Denise fever, abdominal pain, nausea, vomiting.   Allergies: Aspirin and Penicillins  Medications: Prior to Admission medications   Medication Sig Start Date End Date Taking? Authorizing Provider  GLUCOSAMINE-CHONDROITIN PO Take 2,000 Units by mouth.   Yes [provider]  predniSONE (DELTASONE) 20 MG tablet 3 tabs daily for 1 week then 2 tabs daily for 1 week and then 1 tab daily. 08/07/20  Yes Shelda Pal, DO     Vital Signs: BP 139/84 (BP Location: Right Arm)   Pulse (!) 59   Temp 98.1 F (36.7 C) (Oral)   Resp 18   Ht 5\' 8"  (1.727 m)   Wt 195 lb 1.7 oz (88.5 kg)   SpO2 92%   BMI 29.67 kg/m   Physical Exam Vitals reviewed.  Constitutional:      General: He is not in acute distress.    Appearance: He is well-developed. He is not ill-appearing.  Skin:    Comments: Positive RLQ drain. Site is unremarkable with no erythema, edema, tenderness, bleeding or drainage noted at exit site. Suture and stat lock in place. Dressing is clean dry and intact. 10 ml of clear, blood tinged fluid noted in the suction bulb. Drain flushes well.   Neurological:     Mental Status: He is alert.     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 Staging examination. EXAM: CT CHEST WITH CONTRAST TECHNIQUE: Multidetector CT imaging of the chest was performed during intravenous contrast administration. CONTRAST:  42mL OMNIPAQUE IOHEXOL 300 MG/ML  SOLN COMPARISON:  PET-CT 09/04/2006. FINDINGS: Cardiovascular: Heart size is normal.  There is no significant pericardial fluid, thickening or pericardial calcification. There is aortic atherosclerosis, as well as atherosclerosis of the great vessels of the mediastinum and the coronary arteries, including calcified atherosclerotic plaque in the left main, left anterior descending and right coronary arteries. Mediastinum/Nodes: No pathologically enlarged mediastinal or hilar lymph nodes. Nasogastric tube extending into the stomach. Esophagus is otherwise unremarkable in appearance. No axillary lymphadenopathy. Lungs/Pleura: Postoperative changes of wedge resection in the left lower lobe. Scattered areas of linear scarring are noted in the lower lobes of the lungs bilaterally, similar to prior PET-CT from 2008. Linear scarring in the anterior aspect of the right upper lobe also noted on today's examination. No acute consolidative airspace disease. No pleural effusions. No suspicious appearing pulmonary nodules or masses are noted. Upper Abdomen: Please see separate dictation for contemporaneously obtained CT the abdomen and pelvis 10/03/2020 for full description of findings beneath the diaphragm. Musculoskeletal: There are no aggressive appearing lytic or blastic lesions noted in the visualized portions of the skeleton. IMPRESSION: 1. No findings to suggest metastatic disease in the thorax. 2. Aortic atherosclerosis, in addition to left main and 2 vessel coronary artery disease. Please note that although the presence of coronary artery calcium documents the presence of coronary artery disease, the severity of this disease and any potential stenosis cannot be assessed on this non-gated CT examination. Assessment for potential risk factor modification, dietary therapy or pharmacologic therapy may be warranted, if clinically indicated. 3. Postoperative changes, support apparatus and  additional incidental findings, as above. Aortic Atherosclerosis (ICD10-I70.0). Electronically Signed   By: Vinnie Langton  M.D.   On: 10/03/2020 17:19   CT ABDOMEN PELVIS W CONTRAST  Result Date: 10/03/2020 CLINICAL DATA:  Inpatient. Status post appendectomy 09/28/2020. Postoperative abdominal pain, bloating and fever. 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 EXAM: CT ABDOMEN AND PELVIS WITH CONTRAST TECHNIQUE: Multidetector CT imaging of the abdomen and pelvis was performed using the standard protocol following bolus administration of intravenous contrast. CONTRAST:  153mL OMNIPAQUE IOHEXOL 300 MG/ML  SOLN COMPARISON:  09/28/2020 CT abdomen/pelvis. FINDINGS: Lower chest: No significant pulmonary nodules or acute consolidative airspace disease. Coronary atherosclerosis. Hepatobiliary: Normal liver size. No liver mass. Normal gallbladder with no radiopaque cholelithiasis. No biliary ductal dilatation. Pancreas: Normal, with no mass or duct dilation. Spleen: Normal size. No mass. Adrenals/Urinary Tract: Normal adrenals. No hydronephrosis. Simple 1.7 cm posterior upper right renal cyst. No suspicious renal masses. Normal bladder. Stomach/Bowel: Mildly distended fluid-filled stomach with no gastric wall thickening. New prominent dilatation of multiple fluid-filled proximal and mid small bowel loops up to 5.9 cm diameter. A few mildly thick walled right lower quadrant small bowel loops are noted. No discrete small bowel caliber transition. No pneumatosis. Interval appendectomy. Gas containing 10.0 x 3.6 x 4.9 cm fluid collection in the appendectomy bed (series 2/image 71). Normal large bowel with no diverticulosis, large bowel wall thickening or pericolonic fat stranding. Vascular/Lymphatic: Atherosclerotic nonaneurysmal abdominal aorta. Patent portal, splenic, hepatic and renal veins. No pathologically enlarged lymph nodes in the abdomen or pelvis. Reproductive: Top normal size prostate. Other: No ascites. Small 3.8 x 2.2 x 2.0 cm fluid collection in midline deep pelvis between the bladder and rectum with mildly  thickened wall (series 2/image 89), new. New small 2.2 x 1.8 x 2.2 cm fluid collection associated with chronic small left periumbilical hernia, which does not contain bowel loops. Musculoskeletal: No aggressive appearing focal osseous lesions. Mild thoracolumbar spondylosis. IMPRESSION: 1. Interval appendectomy. Gas-containing 10.0 x 3.6 x 4.9 cm fluid collection in the appendectomy bed, compatible with abscess. 2. Small 3.8 x 2.2 x 2.0 cm fluid collection in the midline deep pelvis between the bladder and rectum with mildly thickened wall, new, suspicious for a small abscess. 3. New small 2.2 x 1.8 x 2.2 cm fluid collection associated with chronic small left periumbilical hernia, potentially a small abscess. No bowel loops within this periumbilical hernia. 4. New prominent dilatation of multiple fluid-filled proximal and mid small bowel loops. No discrete small bowel caliber transition. Findings are presumably due to severe postoperative adynamic ileus. A few mildly thickened right lower quadrant small bowel loops are probably reactive. 5. Aortic Atherosclerosis (ICD10-I70.0). Electronically Signed   By: Ilona Sorrel M.D.   On: 10/03/2020 09:38   DG Abd Portable 1V  Result Date: 10/03/2020 CLINICAL DATA:  Nasogastric tube placement EXAM: PORTABLE ABDOMEN - 1 VIEW COMPARISON:  CT abdomen and pelvis October 03, 2020 FINDINGS: Nasogastric tube tip is in the proximal stomach. The side port is not well seen but is likely above the gastroesophageal junction. Loops of dilated bowel remain in the upper abdomen concerning for persistent bowel obstruction. No free air evident. Lung bases clear. IMPRESSION: Nasogastric tube tip in proximal stomach. Advise advancing nasogastric tube 5-6 cm. Findings in the visualized abdomen concerning for persistent bowel obstruction. No free air appreciable. Electronically Signed   By: Lowella Grip III M.D.   On: 10/03/2020 11:18   CT IMAGE GUIDED DRAINAGE BY PERCUTANEOUS  CATHETER  Result Date: 10/03/2020 INDICATION: 60 year old male with a history appendectomy and right lower quadrant fluid collection. EXAM: CT GUIDED DRAINAGE OF  ABSCESS MEDICATIONS: None ANESTHESIA/SEDATION: 3.0 mg IV Versed 100 mcg IV Fentanyl Moderate Sedation Time:  10 minutes The patient was continuously monitored during the procedure by the interventional radiology nurse under my direct supervision. COMPLICATIONS: None TECHNIQUE: Informed written consent was obtained from the patient after a thorough discussion of the procedural risks, benefits and alternatives. All questions were addressed. Maximal Sterile Barrier Technique was utilized including caps, mask, sterile gowns, sterile gloves, sterile drape, hand hygiene and skin antiseptic. A timeout was performed prior to the initiation of the procedure. PROCEDURE: The operative field was prepped with Chlorhexidine in a sterile fashion, and a sterile drape was applied covering the operative field. A sterile gown and sterile gloves were used for the procedure. Local anesthesia was provided with 1% Lidocaine. Once the patient was prepped and draped in the usual sterile fashion 1% lidocaine was used for local anesthesia. Using CT guidance, trocar needle was advanced into the fluid collection of the right lower quadrant. Modified Seldinger technique was used to place a 10 [place] drain. 20 cc of thin amber fluid was aspirated for a culture. 30 cc of thin amber fluid was aspirated for cytology sample. The drain was sutured in position and attached to bulb drainage. Patient tolerated the procedure well and remained hemodynamically stable throughout. No complications were encountered and no significant blood loss. FINDINGS: Initial CT demonstrates similar appearance of fluid and gas collection within the right lower quadrant. Ten [place] drain terminates within the fluid collection. Approximately 50 cc of thin amber fluid aspirated with 30 cc sent for cytology and 20  cc sent for culture. IMPRESSION: Status post CT-guided drainage of right lower quadrant fluid collection with sample sent for cytology and culture. Signed, Dulcy Fanny. Dellia Nims, RPVI [facility] Electronically Signed   By: Corrie Mckusick D.O.   On: 10/03/2020 17:27   [place] EKG SITE RITE  Result Date: 10/04/2020 If Site Rite image not attached, placement could not be confirmed due to current cardiac rhythm.   Labs:  CBC: Recent Labs    10/01/20 0419 10/02/20 0914 10/03/20 0442 10/04/20 0452  WBC 9.1 9.7 10.9* 10.7*  HGB 13.2 15.1 15.9 14.0  HCT 40.0 45.9 46.9 43.2  PLT 185 258 271 332    COAGS: Recent Labs    10/03/20 0948  INR 1.0    BMP: Recent Labs    09/29/20 0537 10/01/20 0419 10/02/20 0914 10/03/20 0442 10/04/20 0452  NA 137  --  136 138 142  K 4.0 3.5 3.9 4.3 3.5  CL 103  --  99 102 101  CO2 25  --  25 24 30   GLUCOSE 162*  --  123* 157* 117*  BUN 11  --  16 23* 23*  CALCIUM 8.1*  --  8.2* 8.5* 8.3*  CREATININE 0.81 0.82 0.88 0.86 0.90  GFRNONAA >60 >60 >60 >60 >60    LIVER FUNCTION TESTS: Recent Labs    07/16/20 0917 09/28/20 0757 09/29/20 0537  BILITOT 0.4 1.1 1.1  AST 15 23 14*  ALT 22 27 18   ALKPHOS 46 41 33*  PROT 6.7 7.7 6.4*  ALBUMIN 4.1 4.5 3.4*    Assessment and Plan: S/p drain placement for RLQ intraabdominal fluid collection on 10/03/20.  Patient stable, afebrile. WBC 10.7, slightly down from 10.9 yesterday. Drain site unremarkable, overnight OP 10 mL.  Continue with flushing TID, output recording q shift and dressing changes as needed. Would consider additional imaging when output is less than 10 ml for 24 hours not including flush material.     Electronically Signed: Tera Mater, PA-C 10/04/2020, 2:02 PM   I spent a total of 15 Minutes at the the patient's bedside AND on the patient's hospital floor or unit, greater than 50% of which was counseling/coordinating care for  drain placement for RLQ intraabdominal fluid collection.

## 2020-10-05 ENCOUNTER — Inpatient Hospital Stay: Payer: Self-pay

## 2020-10-05 LAB — BASIC METABOLIC PANEL
Anion gap: 10 (ref 5–15)
BUN: 24 mg/dL — ABNORMAL HIGH (ref 6–20)
CO2: 32 mmol/L (ref 22–32)
Calcium: 8.7 mg/dL — ABNORMAL LOW (ref 8.9–10.3)
Chloride: 101 mmol/L (ref 98–111)
Creatinine, Ser: 0.86 mg/dL (ref 0.61–1.24)
GFR, Estimated: >60 mL/min (ref >60)
Glucose, Bld: 127 mg/dL — ABNORMAL HIGH (ref 70–99)
Potassium: 4 mmol/L (ref 3.5–5.1)
Sodium: 143 mmol/L (ref 135–145)

## 2020-10-05 LAB — CBC
HCT: 43.3 % (ref 39.0–52.0)
Hemoglobin: 14 g/dL (ref 13.0–17.0)
MCH: 29 pg (ref 26.0–34.0)
MCHC: 32.3 g/dL (ref 30.0–36.0)
MCV: 89.8 fL (ref 80.0–100.0)
Platelets: 337 10*3/uL (ref 150–400)
RBC: 4.82 MIL/uL (ref 4.22–5.81)
RDW: 12.5 % (ref 11.5–15.5)
WBC: 10.5 10*3/uL (ref 4.0–10.5)
nRBC: 0 % (ref 0.0–0.2)

## 2020-10-05 LAB — CEA: CEA: 1.6 ng/mL (ref 0.0–4.7)

## 2020-10-05 LAB — GLUCOSE, CAPILLARY: Glucose-Capillary: 118 mg/dL — ABNORMAL HIGH (ref 70–99)

## 2020-10-05 LAB — PHOSPHORUS: Phosphorus: 3.6 mg/dL (ref 2.5–4.6)

## 2020-10-05 LAB — MAGNESIUM: Magnesium: 2.6 mg/dL — ABNORMAL HIGH (ref 1.7–2.4)

## 2020-10-05 MED ORDER — INSULIN ASPART 100 UNIT/ML ~~LOC~~ SOLN
0.0000 [IU] | Freq: Four times a day (QID) | SUBCUTANEOUS | Status: DC
  Administered 2020-10-06 (×2): 1 [IU] via SUBCUTANEOUS
  Administered 2020-10-06: 2 [IU] via SUBCUTANEOUS
  Administered 2020-10-07: 1 [IU] via SUBCUTANEOUS
  Administered 2020-10-07: 2 [IU] via SUBCUTANEOUS
  Administered 2020-10-07 – 2020-10-08 (×3): 1 [IU] via SUBCUTANEOUS

## 2020-10-05 MED ORDER — SODIUM CHLORIDE 0.9% FLUSH: 10.0000 mL | INTRAVENOUS | Status: DC | PRN

## 2020-10-05 MED ORDER — LACTATED RINGERS IV SOLN: INTRAVENOUS | Status: AC

## 2020-10-05 MED ORDER — CHLORHEXIDINE GLUCONATE CLOTH 2 % EX PADS
6.0000 | MEDICATED_PAD | Freq: Every day | CUTANEOUS | Status: DC
  Administered 2020-10-05 – 2020-10-08 (×4): 6 via TOPICAL

## 2020-10-05 MED ORDER — TRAVASOL 10 % IV SOLN
INTRAVENOUS | Status: AC
  Filled 2020-10-05: qty 460.8

## 2020-10-05 NOTE — Progress Notes (Signed)
Peripherally Inserted Central Catheter Placement  The IV Nurse has discussed with the patient and/or persons authorized to consent for the patient, the purpose of this procedure and the potential benefits and risks involved with this procedure.  The benefits include less needle sticks, lab draws from the catheter, and the patient may be discharged home with the catheter. Risks include, but not limited to, infection, bleeding, blood clot (thrombus formation), and puncture of an artery; nerve damage and irregular heartbeat and possibility to perform a PICC exchange if needed/ordered by physician.  Alternatives to this procedure were also discussed.  Bard Power PICC patient education guide, fact sheet on infection prevention and patient information card has been provided to patient /or left at bedside.    PICC Placement Documentation  PICC Double Lumen 20/35/59 PICC Right Basilic 40 cm 1 cm (Active)  Indication for Insertion or Continuance of Line Administration of hyperosmolar/irritating solutions (i.e. TPN, Vancomycin, etc.) 10/05/20 1341  Exposed Catheter (cm) 1 cm 10/05/20 1341  Site Assessment Clean;Dry;Intact 10/05/20 1341  Lumen #1 Status Flushed;Blood return noted 10/05/20 1341  Lumen #2 Status Flushed;Blood return noted 10/05/20 1341  Dressing Type Transparent 10/05/20 1341  Dressing Status Clean;Dry;Intact 10/05/20 1341  Antimicrobial disc in place? Yes 10/05/20 1341  Dressing Intervention New dressing;Other (Comment) 10/05/20 1341  Dressing Change Due 10/12/20 10/05/20 1341       Christella Noa Albarece 10/05/2020, 1:42 PM

## 2020-10-05 NOTE — Progress Notes (Signed)
Initial Nutrition Assessment  INTERVENTION:   -TPN management per Pharmacy  -Will monitor for diet advancement  NUTRITION DIAGNOSIS:   Increased nutrient needs related to cancer and cancer related treatments as evidenced by estimated needs.  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 He reports acute onset generalized abdominal pain that woke him up from his sleep at 10 PM. Pain became gradually worse and localized to his lower abdomen. Pain is worse with movement and deep inspiration. Associated with decreased appetite, abdominal distention, and decreased frequency of BMs. Admitted for acute appendicitis.  3/11: admitted, s/p lap appendectomy 3/16: IR placed drain for abscess 3/17: NGT replaced  Patient in room with family at bedside. Pt states his new NGT is much more comfortable. Noted > 500 ml of output in canister. Pt denies nausea currently.  States the last time he tolerated a meal was "sometime last week". States he was started on a diet following his surgery and he didn't do well and he didn't tolerate liquid diets either.  Pt has had poor PO for >7 days now. TPN to begin today.  Per oncology note, pt with appendiceal Ca w/ perforation. Plan will be for surgery in 6 weeks.  Per weight record, pt has lost 10 lbs since 1/18 (4% wt loss x 2 months, insignificant for time frame).  Medications reviewed.  Labs reviewed: Elevated Mg (2.6)  NUTRITION - FOCUSED PHYSICAL EXAM:  Flowsheet Row Most Recent Value  Orbital Region No depletion  Upper Arm Region No depletion  Thoracic and Lumbar Region Unable to assess  Buccal Region Mild depletion  Temple Region Mild depletion   Clavicle Bone Region No depletion  Clavicle and Acromion Bone Region No depletion  Scapular Bone Region No depletion  Dorsal Hand No depletion  Patellar Region Unable to assess  Anterior Thigh Region Unable to assess  Posterior Calf Region Unable to assess  Edema (RD Assessment) None       Diet Order:   Diet Order            Diet NPO time specified Except for: Sips with Meds  Diet effective now                 EDUCATION NEEDS:   No education needs have been identified at this time  Skin:  Skin Assessment: Skin Integrity Issues: Skin Integrity Issues:: Incisions Incisions: 3/11 abdomen  Last BM:  3/18 -type 7  Height:   Ht Readings from Last 1 Encounters:  09/28/20 5\' 8"  (1.727 m)    Weight:   Wt Readings from Last 1 Encounters:  09/28/20 88.5 kg    BMI:  Body mass index is 29.67 kg/m.  Estimated Nutritional Needs:   Kcal:  1900-2100  Protein:  85-100g  Fluid:  2L/day   Clayton Bibles, MS, RD, LDN Inpatient Clinical Dietitian Contact information available via Amion

## 2020-10-05 NOTE — Progress Notes (Signed)
[facility] Progress Note  7 Days Post-Op  Subjective: CC-  NG replaced last night for abdominal bloating. States that he has passed some loose stool. No flatus. States that he has very little pain. He was taking the morphine yesterday for pain from NG tube, but this new NG is actually causing less pain. Ambulating often.  Objective: Vital signs in last 24 hours: Temp:  [97.5 F (36.4 C)-98.2 F (36.8 C)] 98.2 F (36.8 C) (03/18 0609) Pulse Rate:  [59-63] 62 (03/18 0609) Resp:  [18] 18 (03/18 0609) BP: (139-151)/(84-89) 141/85 (03/18 0609) SpO2:  [92 %-96 %] 95 % (03/18 0609) Last BM Date: 10/04/20  Intake/Output from previous day: 03/17 0701 - 03/18 0700 In: 2611.5 [P.O.:150; I.V.:1300; IV Piggyback:661.5] Out: 4230 [Urine:400; Emesis/NG output:3800; Drains:30] Intake/Output this shift: No intake/output data recorded.  PE: Gen:  Alert, NAD Pulm:  rate and effort normal Abd: Soft, mild distension, minimal diffuse TTP, hypoactive BS, incisions cdi, drain output serous  Lab Results:  Recent Labs    10/04/20 0452 10/05/20 0439  WBC 10.7* 10.5  HGB 14.0 14.0  HCT 43.2 43.3  PLT 332 337   BMET Recent Labs    10/04/20 0452 10/05/20 0439  NA 142 143  K 3.5 4.0  CL 101 101  CO2 30 32  GLUCOSE 117* 127*  BUN 23* 24*  CREATININE 0.90 0.86  CALCIUM 8.3* 8.7*   PT/INR Recent Labs    10/03/20 0948  LABPROT 13.2  INR 1.0   CMP     Component Value Date/Time   NA 143 10/05/2020 0439   NA 139 12/04/2015 1551   K 4.0 10/05/2020 0439   K 3.9 12/04/2015 1551   CL 101 10/05/2020 0439   CL 104 10/06/2012 1546   CO2 32 10/05/2020 0439   CO2 26 12/04/2015 1551   GLUCOSE 127 (H) 10/05/2020 0439   GLUCOSE 88 12/04/2015 1551   GLUCOSE 117 (H) 10/06/2012 1546   BUN 24 (H) 10/05/2020 0439   BUN 13.8 12/04/2015 1551   CREATININE 0.86 10/05/2020 0439   CREATININE 0.9 12/04/2015 1551   CALCIUM 8.7 (L) 10/05/2020 0439   CALCIUM 8.9 12/04/2015 1551    PROT 6.4 (L) 09/29/2020 0537   PROT 7.1 12/04/2015 1551   ALBUMIN 3.4 (L) 09/29/2020 0537   ALBUMIN 3.8 12/04/2015 1551   AST 14 (L) 09/29/2020 0537   AST 17 12/04/2015 1551   ALT 18 09/29/2020 0537   ALT 25 12/04/2015 1551   ALKPHOS 33 (L) 09/29/2020 0537   ALKPHOS 45 12/04/2015 1551   BILITOT 1.1 09/29/2020 0537   BILITOT 0.43 12/04/2015 1551   GFRNONAA >60 10/05/2020 0439   Lipase     Component Value Date/Time   LIPASE 39 09/28/2020 0757       Studies/Results: DG Abd 1 View  Result Date: 10/04/2020 CLINICAL DATA:  NG tube placement EXAM: ABDOMEN - 1 VIEW COMPARISON:  10/03/2020, CT 10/03/2020 FINDINGS: Esophageal tube tip and side port overlie mid gastric region. Dilated small bowel in the upper abdomen measuring up to 5.5 cm consistent appearance suspicious for obstruction IMPRESSION: Esophageal tube tip overlies the mid gastric region. Electronically Signed   By: Donavan Foil M.D.   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 Staging examination. EXAM: CT CHEST WITH CONTRAST TECHNIQUE: Multidetector CT imaging of the chest was performed during  intravenous contrast administration. CONTRAST:  74mL OMNIPAQUE IOHEXOL 300 MG/ML  SOLN COMPARISON:  PET-CT 09/04/2006. FINDINGS: Cardiovascular: Heart size is normal. There is no significant pericardial fluid, thickening or pericardial calcification. There is aortic atherosclerosis, as well as atherosclerosis of the great vessels of the mediastinum and the coronary arteries, including calcified atherosclerotic plaque in the left main, left anterior descending and right coronary arteries. Mediastinum/Nodes: No pathologically enlarged mediastinal or hilar lymph nodes. Nasogastric tube extending into the stomach. Esophagus is otherwise unremarkable in appearance. No axillary  lymphadenopathy. Lungs/Pleura: Postoperative changes of wedge resection in the left lower lobe. Scattered areas of linear scarring are noted in the lower lobes of the lungs bilaterally, similar to prior PET-CT from 2008. Linear scarring in the anterior aspect of the right upper lobe also noted on today's examination. No acute consolidative airspace disease. No pleural effusions. No suspicious appearing pulmonary nodules or masses are noted. Upper Abdomen: Please see separate dictation for contemporaneously obtained CT the abdomen and pelvis 10/03/2020 for full description of findings beneath the diaphragm. Musculoskeletal: There are no aggressive appearing lytic or blastic lesions noted in the visualized portions of the skeleton. IMPRESSION: 1. No findings to suggest metastatic disease in the thorax. 2. Aortic atherosclerosis, in addition to left main and 2 vessel coronary artery disease. Please note that although the presence of coronary artery calcium documents the presence of coronary artery disease, the severity of this disease and any potential stenosis cannot be assessed on this non-gated CT examination. Assessment for potential risk factor modification, dietary therapy or pharmacologic therapy may be warranted, if clinically indicated. 3. Postoperative changes, support apparatus and additional incidental findings, as above. Aortic Atherosclerosis (ICD10-I70.0). Electronically Signed   By: Vinnie Langton M.D.   On: 10/03/2020 17:19   DG Abd Portable 1V  Result Date: 10/03/2020 CLINICAL DATA:  Nasogastric tube placement EXAM: PORTABLE ABDOMEN - 1 VIEW COMPARISON:  CT abdomen and pelvis October 03, 2020 FINDINGS: Nasogastric tube tip is in the proximal stomach. The side port is not well seen but is likely above the gastroesophageal junction. Loops of dilated bowel remain in the upper abdomen concerning for persistent bowel obstruction. No free air evident. Lung bases clear. IMPRESSION: Nasogastric tube tip  in proximal stomach. Advise advancing nasogastric tube 5-6 cm. Findings in the visualized abdomen concerning for persistent bowel obstruction. No free air appreciable. Electronically Signed   By: Lowella Grip III M.D.   On: 10/03/2020 11:18   CT IMAGE GUIDED DRAINAGE BY PERCUTANEOUS CATHETER  Result Date: 10/03/2020 INDICATION: 60 year old male with a history appendectomy and right lower quadrant fluid collection. EXAM: CT GUIDED DRAINAGE OF  ABSCESS MEDICATIONS: None ANESTHESIA/SEDATION: 3.0 mg IV Versed 100 mcg IV Fentanyl Moderate Sedation Time:  10 minutes The patient was continuously monitored during the procedure by the interventional radiology nurse under my direct supervision. COMPLICATIONS: None TECHNIQUE: Informed written consent was obtained from the patient after a thorough discussion of the procedural risks, benefits and alternatives. All questions were addressed. Maximal Sterile Barrier Technique was utilized including caps, mask, sterile gowns, sterile gloves, sterile drape, hand hygiene and skin antiseptic. A timeout was performed prior to the initiation of the procedure. PROCEDURE: The operative field was prepped with Chlorhexidine in a sterile fashion, and a sterile drape was applied covering the operative field. A sterile gown and sterile gloves were used for the procedure. Local anesthesia was provided with 1% Lidocaine. Once the patient was prepped and draped in the usual sterile fashion 1% lidocaine was used for  local anesthesia. Using CT guidance, trocar needle was advanced into the fluid collection of the right lower quadrant. Modified Seldinger technique was used to place a 10 [place] drain. 20 cc of thin amber fluid was aspirated for a culture. 30 cc of thin amber fluid was aspirated for cytology sample. The drain was sutured in position and attached to bulb drainage. Patient tolerated the procedure well and remained hemodynamically stable throughout. No complications were  encountered and no significant blood loss. FINDINGS: Initial CT demonstrates similar appearance of fluid and gas collection within the right lower quadrant. Ten [place] drain terminates within the fluid collection. Approximately 50 cc of thin amber fluid aspirated with 30 cc sent for cytology and 20 cc sent for culture. IMPRESSION: Status post CT-guided drainage of right lower quadrant fluid collection with sample sent for cytology and culture. Signed, Dulcy Fanny. Dellia Nims, RPVI [facility] Electronically Signed   By: Corrie Mckusick D.O.   On: 10/03/2020 17:27   [place] EKG SITE RITE  Result Date: 10/05/2020 If Site Rite image not attached, placement could not be confirmed due to current cardiac rhythm.  [place] EKG SITE RITE  Result Date: 10/04/2020 If Site Rite image not attached, placement could not be confirmed due to current cardiac rhythm.   Anti-infectives: Anti-infectives (From admission, onward)   Start     Dose/Rate Route Frequency Ordered Stop   10/05/20 1000  anidulafungin (ERAXIS) 100 mg in sodium chloride 0.9 % 100 mL IVPB        100 mg 78 mL/hr over 100 Minutes Intravenous Every 24 hours 10/04/20 1143     10/04/20 1400  meropenem (MERREM) 1 g in sodium chloride 0.9 % 100 mL IVPB        1 g 200 mL/hr over 30 Minutes Intravenous Every 8 hours 10/04/20 1038 10/10/20 0559   10/04/20 1230  anidulafungin (ERAXIS) 200 mg in sodium chloride 0.9 % 200 mL IVPB        200 mg 78 mL/hr over 200 Minutes Intravenous  Once 10/04/20 1143 10/04/20 1925   09/29/20 1300  metroNIDAZOLE (FLAGYL) IVPB 500 mg  Status:  Discontinued        500 mg 100 mL/hr over 60 Minutes Intravenous Every 8 hours 09/29/20 1110 10/01/20 1108   09/29/20 0045  meropenem (MERREM) 1 g in sodium chloride 0.9 % 100 mL IVPB        1 g 200 mL/hr over 30 Minutes Intravenous Every 8 hours 09/28/20 2358 10/04/20 0845   09/28/20 1030  levofloxacin (LEVAQUIN) IVPB 750 mg  Status:   Discontinued        750 mg 100 mL/hr over 90 Minutes Intravenous Every 24 hours 09/28/20 1018 09/28/20 2343   09/28/20 1030  metroNIDAZOLE (FLAGYL) IVPB 500 mg        500 mg 100 mL/hr over 60 Minutes Intravenous  Once 09/28/20 1018 09/28/20 1211       Assessment/Plan POD 7, s/p lap appy for perforated gangrenous appendicitis - pathology revealed goblet cell adenocarcinoma.  Dr. 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 Will need a right colectomy in about 6 weeks.  - postop abscess s/p IR drain placement 3/16, cx pending, cytology with no malignant cells identified - persistent ileus. Patient agreeable to PICC/TPN - ordered - continue mobilizing - limit narcotics - cont IV abx therapy  FEN - IVF, NPO, NGT  to LIWS. Start TPN VTE - lovenox ID - Merrem 3/12>>, eraxis 3/17>>   LOS: 7 days    Wellington Hampshire, [facility] 10/05/2020, 9:13 AM Please see Amion for pager number during day hours 7:00am-4:30pm

## 2020-10-05 NOTE — Progress Notes (Signed)
PHARMACY - TOTAL PARENTERAL NUTRITION CONSULT NOTE   Indication: Prolonged ileus  Patient Measurements: Height: 5\' 8"  (172.7 cm) Weight: 88.5 kg (195 lb 1.7 oz) IBW/kg (Calculated) : 68.4 TPN AdjBW (KG): 73.4 Body mass index is 29.67 kg/m. Weight 88.5 kg is 129 % of IBW  Assessment:   Glucose / Insulin: Glucose < 150, not currently on insulin Electrolytes: WNL, except Mag 2.6 Renal: SCr < 1, BUN 24 Hepatic: pending Intake / Output; MIVF: IVF: LR @ 75 - Large NG output: 3800 mL/day - UOP 400 mL recorded, 3x Urine, 1x stool - Net I/O: -1.6 L GI Imaging: GI Surgeries / Procedures:   Central access: PICC ordered 3/18 TPN start date: pending PICC placement  Nutritional Goals (per RD recommendation pending): kCal: , Protein: , Fluid:   Estimated Goals:  Kcal 2190 /day Protein 95-116 g /day Goal TPN at 90 ml/hr to provide 103 g protein/day and 2056 kcal / day   Current Nutrition:  NPO  Plan:  At 18:00: Start TPN at 40 mL/hr Electrolytes in TPN: Na 49mEq/L, K 67mEq/L, Ca 48mEq/L, Mg 0 mEq/L, and Phos 86mmol/L. Cl:Ac 1:1 Add standard MVI and trace elements to TPN Initiate Q6h CBGs and sensitive SSI and adjust as needed  Reduce MIVF to 35 mL/hr at 1800  Monitor TPN labs on Mon/Thurs, tomorrow morning.   Gretta Arab PharmD, BCPS Clinical Pharmacist [facility] main pharmacy [phone] 10/05/2020 10:04 AM

## 2020-10-05 NOTE — Progress Notes (Signed)
Referring Physician(s): Dr. Johney Maine, S.   Supervising Physician: Dr. Pascal Lux  Patient Status:  [facility] - In-pt  Chief Complaint: S/p drain placement for RLQ intra-abdominal fluid collection on 10/03/20  Subjective: Patient states that he is doing ok.  Denise fever, abdominal pain, nausea, vomiting.   Allergies: Aspirin and Penicillins  Medications:  Current Facility-Administered Medications:  .  0.9 %  sodium chloride infusion, 250 mL, Intravenous, PRN, Michael Boston, MD .  albuterol (PROVENTIL) (2.5 MG/3ML) 0.083% nebulizer solution 2.5 mg, 2.5 mg, Nebulization, Q6H PRN, Michael Boston, MD .  alum & mag hydroxide-simeth (MAALOX/MYLANTA) 200-200-20 MG/5ML suspension 30 mL, 30 mL, Oral, Q6H PRN, Michael Boston, MD, 30 mL at 10/02/20 1840 .  anidulafungin (ERAXIS) 100 mg in sodium chloride 0.9 % 100 mL IVPB, 100 mg, Intravenous, Q24H, Gross, Remo Lipps, MD, Last Rate: 78 mL/hr at 10/05/20 1004, 100 mg at 10/05/20 1004 .  Chlorhexidine Gluconate Cloth 2 % PADS 6 each, 6 each, Topical, Daily, Meuth, Brooke A, PA-C, 6 each at 10/05/20 1418 .  chlorproMAZINE (THORAZINE) 12.5 mg in sodium chloride 0.9 % 25 mL IVPB, 12.5 mg, Intravenous, Q6H PRN, Saverio Danker, PA-C .  enoxaparin (LOVENOX) injection 40 mg, 40 mg, Subcutaneous, Q24H, Cornett, Thomas, MD, 40 mg at 09/29/20 0855 .  guaiFENesin-dextromethorphan (ROBITUSSIN DM) 100-10 MG/5ML syrup 10 mL, 10 mL, Oral, Q4H PRN, Michael Boston, MD .  hydrALAZINE (APRESOLINE) injection 10 mg, 10 mg, Intravenous, Q6H PRN, Saverio Danker, PA-C .  hydrocortisone (ANUSOL-HC) 2.5 % rectal cream 1 application, 1 application, Topical, QID PRN, Michael Boston, MD .  hydrocortisone cream 1 % 1 application, 1 application, Topical, TID PRN, Michael Boston, MD .  insulin aspart (novoLOG) injection 0-9 Units, 0-9 Units, Subcutaneous, Q6H, Shade, Christine E, RPH .  lactated ringers infusion, , Intravenous, Continuous, Shade, Haze Justin, RPH, Last Rate: 75 mL/hr at 10/03/20  1317, New Bag at 10/03/20 1317 .  lactated ringers infusion, , Intravenous, Continuous, Shade, Haze Justin, RPH .  lip balm (CARMEX) ointment 1 application, 1 application, Topical, BID, Michael Boston, MD, 1 application at 40/10/27 1004 .  magic mouthwash, 15 mL, Oral, QID PRN, Michael Boston, MD .  menthol-cetylpyridinium (CEPACOL) lozenge 3 mg, 1 lozenge, Oral, PRN, Michael Boston, MD .  meropenem (MERREM) 1 g in sodium chloride 0.9 % 100 mL IVPB, 1 g, Intravenous, Q8H, Gross, Remo Lipps, MD, Last Rate: 200 mL/hr at 10/05/20 1426, 1 g at 10/05/20 1426 .  methocarbamol (ROBAXIN) 1,000 mg in dextrose 5 % 100 mL IVPB, 1,000 mg, Intravenous, Q6H PRN, Michael Boston, MD, Last Rate: 200 mL/hr at 10/05/20 1424, 1,000 mg at 10/05/20 1424 .  metoprolol tartrate (LOPRESSOR) injection 5 mg, 5 mg, Intravenous, Q6H PRN, Michael Boston, MD .  morphine 2 MG/ML injection 1-4 mg, 1-4 mg, Intravenous, Q2H PRN, Saverio Danker, PA-C, 2 mg at 10/04/20 2035 .  ondansetron (ZOFRAN) injection 4 mg, 4 mg, Intravenous, Q6H PRN, 4 mg at 10/03/20 1032 **OR** ondansetron (ZOFRAN) 8 mg in sodium chloride 0.9 % 50 mL IVPB, 8 mg, Intravenous, Q6H PRN, Michael Boston, MD .  phenol (CHLORASEPTIC) mouth spray 1-2 spray, 1-2 spray, Mouth/Throat, PRN, Michael Boston, MD .  prochlorperazine (COMPAZINE) injection 5-10 mg, 5-10 mg, Intravenous, Q4H PRN, Michael Boston, MD .  sodium chloride flush (NS) 0.9 % injection 10-40 mL, 10-40 mL, Intracatheter, PRN, Meuth, Brooke A, PA-C .  sodium chloride flush (NS) 0.9 % injection 3 mL, 3 mL, Intravenous, Q12H, Michael Boston, MD, 3 mL at 10/05/20 1007 .  sodium  chloride flush (NS) 0.9 % injection 3 mL, 3 mL, Intravenous, PRN, Michael Boston, MD, 3 mL at 10/04/20 1051 .  sodium chloride flush (NS) 0.9 % injection 5 mL, 5 mL, Intracatheter, Q8H, Wagner, Jaime, DO, 5 mL at 10/05/20 1418 .  TPN ADULT (ION), , Intravenous, Continuous TPN, Shade, Christine E, RPH    Vital Signs: BP (!) 149/83 (BP Location:  Right Arm)   Pulse (!) 51   Temp 97.6 F (36.4 C) (Oral)   Resp 18   Ht 5\' 8"  (1.727 m)   Wt 88.6 kg   SpO2 95%   BMI 29.70 kg/m   Physical Exam Vitals reviewed.  Constitutional:      General: He is not in acute distress.    Appearance: He is well-developed. He is not ill-appearing.  Skin:    Comments: RLQ drain. Site is unremarkable with no erythema, edema, tenderness, bleeding or drainage noted at exit site. Suture and stat lock in place. Serous output   Neurological:     Mental Status: He is alert.     Imaging: DG Abd 1 View  Result Date: 10/04/2020 CLINICAL DATA:  NG tube placement EXAM: ABDOMEN - 1 VIEW COMPARISON:  10/03/2020, CT 10/03/2020 FINDINGS: Esophageal tube tip and side port overlie mid gastric region. Dilated small bowel in the upper abdomen measuring up to 5.5 cm consistent appearance suspicious for obstruction IMPRESSION: Esophageal tube tip overlies the mid gastric region. Electronically Signed   By: Donavan Foil M.D.   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 Staging examination. EXAM: CT CHEST WITH CONTRAST TECHNIQUE: Multidetector CT imaging of the chest was performed during intravenous contrast administration. CONTRAST:  73mL OMNIPAQUE IOHEXOL 300 MG/ML  SOLN COMPARISON:  PET-CT 09/04/2006. FINDINGS: Cardiovascular: Heart size is normal. There is no significant pericardial fluid, thickening or pericardial calcification. There is aortic atherosclerosis, as well as atherosclerosis of the great vessels of the mediastinum and the coronary arteries, including calcified atherosclerotic plaque in the left main, left anterior descending and right coronary arteries. Mediastinum/Nodes: No pathologically enlarged mediastinal or hilar lymph nodes. Nasogastric tube extending into the stomach. Esophagus is  otherwise unremarkable in appearance. No axillary lymphadenopathy. Lungs/Pleura: Postoperative changes of wedge resection in the left lower lobe. Scattered areas of linear scarring are noted in the lower lobes of the lungs bilaterally, similar to prior PET-CT from 2008. Linear scarring in the anterior aspect of the right upper lobe also noted on today's examination. No acute consolidative airspace disease. No pleural effusions. No suspicious appearing pulmonary nodules or masses are noted. Upper Abdomen: Please see separate dictation for contemporaneously obtained CT the abdomen and pelvis 10/03/2020 for full description of findings beneath the diaphragm. Musculoskeletal: There are no aggressive appearing lytic or blastic lesions noted in the visualized portions of the skeleton. IMPRESSION: 1. No findings to suggest metastatic disease in the thorax. 2. Aortic atherosclerosis, in addition to left main and 2 vessel coronary artery disease. Please note that although the presence of coronary artery calcium documents the presence of coronary artery disease, the severity of this disease and any potential stenosis cannot be assessed on this non-gated CT examination. Assessment for potential risk factor modification, dietary therapy or pharmacologic therapy may be warranted, if clinically indicated. 3. Postoperative changes, support apparatus and additional incidental findings, as above. Aortic Atherosclerosis (ICD10-I70.0). Electronically Signed   By: Quillian Quince  Entrikin M.D.   On: 10/03/2020 17:19   CT ABDOMEN PELVIS W CONTRAST  Result Date: 10/03/2020 CLINICAL DATA:  Inpatient. Status post appendectomy 09/28/2020. Postoperative abdominal pain, bloating and fever. 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 EXAM: CT ABDOMEN AND PELVIS WITH CONTRAST TECHNIQUE: Multidetector CT imaging of the abdomen and pelvis was performed using the standard protocol following bolus administration of intravenous contrast.  CONTRAST:  147mL OMNIPAQUE IOHEXOL 300 MG/ML  SOLN COMPARISON:  09/28/2020 CT abdomen/pelvis. FINDINGS: Lower chest: No significant pulmonary nodules or acute consolidative airspace disease. Coronary atherosclerosis. Hepatobiliary: Normal liver size. No liver mass. Normal gallbladder with no radiopaque cholelithiasis. No biliary ductal dilatation. Pancreas: Normal, with no mass or duct dilation. Spleen: Normal size. No mass. Adrenals/Urinary Tract: Normal adrenals. No hydronephrosis. Simple 1.7 cm posterior upper right renal cyst. No suspicious renal masses. Normal bladder. Stomach/Bowel: Mildly distended fluid-filled stomach with no gastric wall thickening. New prominent dilatation of multiple fluid-filled proximal and mid small bowel loops up to 5.9 cm diameter. A few mildly thick walled right lower quadrant small bowel loops are noted. No discrete small bowel caliber transition. No pneumatosis. Interval appendectomy. Gas containing 10.0 x 3.6 x 4.9 cm fluid collection in the appendectomy bed (series 2/image 71). Normal large bowel with no diverticulosis, large bowel wall thickening or pericolonic fat stranding. Vascular/Lymphatic: Atherosclerotic nonaneurysmal abdominal aorta. Patent portal, splenic, hepatic and renal veins. No pathologically enlarged lymph nodes in the abdomen or pelvis. Reproductive: Top normal size prostate. Other: No ascites. Small 3.8 x 2.2 x 2.0 cm fluid collection in midline deep pelvis between the bladder and rectum with mildly thickened wall (series 2/image 89), new. New small 2.2 x 1.8 x 2.2 cm fluid collection associated with chronic small left periumbilical hernia, which does not contain bowel loops. Musculoskeletal: No aggressive appearing focal osseous lesions. Mild thoracolumbar spondylosis. IMPRESSION: 1. Interval appendectomy. Gas-containing 10.0 x 3.6 x 4.9 cm fluid collection in the appendectomy bed, compatible with abscess. 2. Small 3.8 x 2.2 x 2.0 cm fluid collection in the  midline deep pelvis between the bladder and rectum with mildly thickened wall, new, suspicious for a small abscess. 3. New small 2.2 x 1.8 x 2.2 cm fluid collection associated with chronic small left periumbilical hernia, potentially a small abscess. No bowel loops within this periumbilical hernia. 4. New prominent dilatation of multiple fluid-filled proximal and mid small bowel loops. No discrete small bowel caliber transition. Findings are presumably due to severe postoperative adynamic ileus. A few mildly thickened right lower quadrant small bowel loops are probably reactive. 5. Aortic Atherosclerosis (ICD10-I70.0). Electronically Signed   By: Ilona Sorrel M.D.   On: 10/03/2020 09:38   DG Abd Portable 1V  Result Date: 10/03/2020 CLINICAL DATA:  Nasogastric tube placement EXAM: PORTABLE ABDOMEN - 1 VIEW COMPARISON:  CT abdomen and pelvis October 03, 2020 FINDINGS: Nasogastric tube tip is in the proximal stomach. The side port is not well seen but is likely above the gastroesophageal junction. Loops of dilated bowel remain in the upper abdomen concerning for persistent bowel obstruction. No free air evident. Lung bases clear. IMPRESSION: Nasogastric tube tip in proximal stomach. Advise advancing nasogastric tube 5-6 cm. Findings in the visualized abdomen concerning for persistent bowel obstruction. No free air appreciable. Electronically Signed   By: Lowella Grip III M.D.   On: 10/03/2020 11:18   CT IMAGE GUIDED DRAINAGE BY PERCUTANEOUS CATHETER  Result Date: 10/03/2020 INDICATION: 60 year old male with a history appendectomy and right lower quadrant fluid collection.  EXAM: CT GUIDED DRAINAGE OF  ABSCESS MEDICATIONS: None ANESTHESIA/SEDATION: 3.0 mg IV Versed 100 mcg IV Fentanyl Moderate Sedation Time:  10 minutes The patient was continuously monitored during the procedure by the interventional radiology nurse under my direct supervision. COMPLICATIONS: None TECHNIQUE: Informed written consent was  obtained from the patient after a thorough discussion of the procedural risks, benefits and alternatives. All questions were addressed. Maximal Sterile Barrier Technique was utilized including caps, mask, sterile gowns, sterile gloves, sterile drape, hand hygiene and skin antiseptic. A timeout was performed prior to the initiation of the procedure. PROCEDURE: The operative field was prepped with Chlorhexidine in a sterile fashion, and a sterile drape was applied covering the operative field. A sterile gown and sterile gloves were used for the procedure. Local anesthesia was provided with 1% Lidocaine. Once the patient was prepped and draped in the usual sterile fashion 1% lidocaine was used for local anesthesia. Using CT guidance, trocar needle was advanced into the fluid collection of the right lower quadrant. Modified Seldinger technique was used to place a 10 [place] drain. 20 cc of thin amber fluid was aspirated for a culture. 30 cc of thin amber fluid was aspirated for cytology sample. The drain was sutured in position and attached to bulb drainage. Patient tolerated the procedure well and remained hemodynamically stable throughout. No complications were encountered and no significant blood loss. FINDINGS: Initial CT demonstrates similar appearance of fluid and gas collection within the right lower quadrant. Ten [place] drain terminates within the fluid collection. Approximately 50 cc of thin amber fluid aspirated with 30 cc sent for cytology and 20 cc sent for culture. IMPRESSION: Status post CT-guided drainage of right lower quadrant fluid collection with sample sent for cytology and culture. Signed, Dulcy Fanny. Dellia Nims, RPVI [facility] Electronically Signed   By: Corrie Mckusick D.O.   On: 10/03/2020 17:27   [place] EKG SITE RITE  Result Date: 10/05/2020 If Site Rite image not attached, placement could not be confirmed due to current cardiac  rhythm.  [place] EKG SITE RITE  Result Date: 10/04/2020 If Site Rite image not attached, placement could not be confirmed due to current cardiac rhythm.   Labs:  CBC: Recent Labs    10/02/20 0914 10/03/20 0442 10/04/20 0452 10/05/20 0439  WBC 9.7 10.9* 10.7* 10.5  HGB 15.1 15.9 14.0 14.0  HCT 45.9 46.9 43.2 43.3  PLT 258 271 332 337    COAGS: Recent Labs    10/03/20 0948  INR 1.0    BMP: Recent Labs    10/02/20 0914 10/03/20 0442 10/04/20 0452 10/05/20 0439  NA 136 138 142 143  K 3.9 4.3 3.5 4.0  CL 99 102 101 101  CO2 25 24 30  32  GLUCOSE 123* 157* 117* 127*  BUN 16 23* 23* 24*  CALCIUM 8.2* 8.5* 8.3* 8.7*  CREATININE 0.88 0.86 0.90 0.86  GFRNONAA >60 >60 >60 >60    LIVER FUNCTION TESTS: Recent Labs    07/16/20 0917 09/28/20 0757 09/29/20 0537  BILITOT 0.4 1.1 1.1  AST 15 23 14*  ALT 22 27 18   ALKPHOS 46 41 33*  PROT 6.7 7.7 6.4*  ALBUMIN 4.1 4.5 3.4*    Assessment and Plan: S/p drain placement for RLQ intraabdominal fluid collection on 10/03/20.  Patient stable, afebrile. WBC 10.7, slightly down from 10.9 yesterday. Drain site unremarkable  Continue with flushing TID, output recording q shift and dressing changes as needed.  IR following  Electronically Signed: Ascencion Dike, PA-C 10/05/2020, 3:47 PM   I spent a total of 15 Minutes at the the patient's bedside AND on the patient's hospital floor or unit, greater than 50% of which was counseling/coordinating care for drain placement for RLQ intraabdominal fluid collection.

## 2020-10-05 NOTE — Progress Notes (Signed)
Patient requested NG tube replacement, contacted MD on call, got verbal order to place NG.

## 2020-10-05 NOTE — Progress Notes (Signed)
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 -- We recommend right colectomy and lymph node resection. He will be followed by Dr Benay Spice for this tumor. We will arrange for outpatient follow up unless you decide to proceed with definitive surgery this admission.  Please let me or Dr Benay Spice know if you have questions or concerns.  Discussed with Louie Casa.

## 2020-10-06 LAB — COMPREHENSIVE METABOLIC PANEL
ALT: 31 U/L (ref 0–44)
AST: 32 U/L (ref 15–41)
Albumin: 3.1 g/dL — ABNORMAL LOW (ref 3.5–5.0)
Alkaline Phosphatase: 42 U/L (ref 38–126)
Anion gap: 11 (ref 5–15)
BUN: 25 mg/dL — ABNORMAL HIGH (ref 6–20)
CO2: 27 mmol/L (ref 22–32)
Calcium: 8.4 mg/dL — ABNORMAL LOW (ref 8.9–10.3)
Chloride: 105 mmol/L (ref 98–111)
Creatinine, Ser: 0.7 mg/dL (ref 0.61–1.24)
GFR, Estimated: >60 mL/min (ref >60)
Glucose, Bld: 150 mg/dL — ABNORMAL HIGH (ref 70–99)
Potassium: 3.6 mmol/L (ref 3.5–5.1)
Sodium: 143 mmol/L (ref 135–145)
Total Bilirubin: 0.7 mg/dL (ref 0.3–1.2)
Total Protein: 6.3 g/dL — ABNORMAL LOW (ref 6.5–8.1)

## 2020-10-06 LAB — DIFFERENTIAL
Abs Immature Granulocytes: 0.07 10*3/uL (ref 0.00–0.07)
Basophils Absolute: 0 10*3/uL (ref 0.0–0.1)
Basophils Relative: 0 %
Eosinophils Absolute: 0 10*3/uL (ref 0.0–0.5)
Eosinophils Relative: 0 %
Immature Granulocytes: 1 %
Lymphocytes Relative: 13 %
Lymphs Abs: 1.4 10*3/uL (ref 0.7–4.0)
Monocytes Absolute: 0.7 10*3/uL (ref 0.1–1.0)
Monocytes Relative: 7 %
Neutro Abs: 8.4 10*3/uL — ABNORMAL HIGH (ref 1.7–7.7)
Neutrophils Relative %: 79 %

## 2020-10-06 LAB — CBC
HCT: 42.3 % (ref 39.0–52.0)
Hemoglobin: 13.6 g/dL (ref 13.0–17.0)
MCH: 29.2 pg (ref 26.0–34.0)
MCHC: 32.2 g/dL (ref 30.0–36.0)
MCV: 90.8 fL (ref 80.0–100.0)
Platelets: 345 10*3/uL (ref 150–400)
RBC: 4.66 MIL/uL (ref 4.22–5.81)
RDW: 12.4 % (ref 11.5–15.5)
WBC: 10.6 10*3/uL — ABNORMAL HIGH (ref 4.0–10.5)
nRBC: 0 % (ref 0.0–0.2)

## 2020-10-06 LAB — GLUCOSE, CAPILLARY
Glucose-Capillary: 112 mg/dL — ABNORMAL HIGH (ref 70–99)
Glucose-Capillary: 123 mg/dL — ABNORMAL HIGH (ref 70–99)
Glucose-Capillary: 128 mg/dL — ABNORMAL HIGH (ref 70–99)
Glucose-Capillary: 148 mg/dL — ABNORMAL HIGH (ref 70–99)
Glucose-Capillary: 154 mg/dL — ABNORMAL HIGH (ref 70–99)

## 2020-10-06 LAB — TRIGLYCERIDES: Triglycerides: 184 mg/dL — ABNORMAL HIGH (ref <150)

## 2020-10-06 LAB — PREALBUMIN: Prealbumin: 24.3 mg/dL (ref 18–38)

## 2020-10-06 LAB — MAGNESIUM: Magnesium: 2.6 mg/dL — ABNORMAL HIGH (ref 1.7–2.4)

## 2020-10-06 LAB — PHOSPHORUS: Phosphorus: 3.5 mg/dL (ref 2.5–4.6)

## 2020-10-06 MED ORDER — LACTATED RINGERS IV SOLN: INTRAVENOUS | Status: DC

## 2020-10-06 MED ORDER — TRAVASOL 10 % IV SOLN
INTRAVENOUS | Status: AC
  Filled 2020-10-06: qty 702

## 2020-10-06 MED ORDER — POTASSIUM CHLORIDE 10 MEQ/100ML IV SOLN
10.0000 meq | INTRAVENOUS | Status: AC
Start: 2020-10-06 — End: 2020-10-06
  Administered 2020-10-06 (×2): 10 meq via INTRAVENOUS
  Filled 2020-10-06: qty 100

## 2020-10-06 NOTE — Plan of Care (Signed)
  Problem: Clinical Measurements: Goal: Postoperative complications will be avoided or minimized Outcome: Progressing   

## 2020-10-06 NOTE — Progress Notes (Signed)
8 Days Post-Op   Subjective/Chief Complaint: Had some flatus and bm x2 this am, feels much better. ambulating   Objective: Vital signs in last 24 hours: Temp:  [97.6 F (36.4 C)-98.4 F (36.9 C)] 98 F (36.7 C) (03/19 0536) Pulse Rate:  [47-66] 47 (03/19 0536) Resp:  [16-18] 16 (03/19 0536) BP: (143-156)/(81-99) 150/81 (03/19 0536) SpO2:  [94 %-99 %] 96 % (03/19 0536) Weight:  [88.6 kg] 88.6 kg (03/18 1159) Last BM Date: 10/05/20  Intake/Output from previous day: 03/18 0701 - 03/19 0700 In: 1546.4 [P.O.:120; I.V.:496.4; IV Piggyback:930] Out: 3370 [Emesis/NG output:3350; Drains:20] Intake/Output this shift: No intake/output data recorded.  Gen:  Alert, NAD Pulm:  rate and effort normal cv rrr Abd: Soft, nondistended nontender incisions clean,  drain output serous   Lab Results:  Recent Labs    10/05/20 0439 10/06/20 0518  WBC 10.5 10.6*  HGB 14.0 13.6  HCT 43.3 42.3  PLT 337 345   BMET Recent Labs    10/05/20 0439 10/06/20 0518  NA 143 143  K 4.0 3.6  CL 101 105  CO2 32 27  GLUCOSE 127* 150*  BUN 24* 25*  CREATININE 0.86 0.70  CALCIUM 8.7* 8.4*   PT/INR Recent Labs    10/03/20 0948  LABPROT 13.2  INR 1.0   ABG No results for input(s): PHART, HCO3 in the last 72 hours.  Invalid input(s): PCO2, PO2  Studies/Results: DG Abd 1 View  Result Date: 10/04/2020 CLINICAL DATA:  NG tube placement EXAM: ABDOMEN - 1 VIEW COMPARISON:  10/03/2020, CT 10/03/2020 FINDINGS: Esophageal tube tip and side port overlie mid gastric region. Dilated small bowel in the upper abdomen measuring up to 5.5 cm consistent appearance suspicious for obstruction IMPRESSION: Esophageal tube tip overlies the mid gastric region. Electronically Signed   By: Donavan Foil M.D.   On: 10/04/2020 21:14   [place] EKG SITE RITE  Result Date: 10/05/2020 If Site Rite image not attached, placement could not be confirmed due to current cardiac rhythm.  [place] EKG SITE RITE  Result Date:  10/04/2020 If Site Rite image not attached, placement could not be confirmed due to current cardiac rhythm.   Anti-infectives: Anti-infectives (From admission, onward)   Start     Dose/Rate Route Frequency Ordered Stop   10/05/20 1000  anidulafungin (ERAXIS) 100 mg in sodium chloride 0.9 % 100 mL IVPB        100 mg 78 mL/hr over 100 Minutes Intravenous Every 24 hours 10/04/20 1143     10/04/20 1400  meropenem (MERREM) 1 g in sodium chloride 0.9 % 100 mL IVPB        1 g 200 mL/hr over 30 Minutes Intravenous Every 8 hours 10/04/20 1038 10/10/20 0559   10/04/20 1230  anidulafungin (ERAXIS) 200 mg in sodium chloride 0.9 % 200 mL IVPB        200 mg 78 mL/hr over 200 Minutes Intravenous  Once 10/04/20 1143 10/04/20 1925   09/29/20 1300  metroNIDAZOLE (FLAGYL) IVPB 500 mg  Status:  Discontinued        500 mg 100 mL/hr over 60 Minutes Intravenous Every 8 hours 09/29/20 1110 10/01/20 1108   09/29/20 0045  meropenem (MERREM) 1 g in sodium chloride 0.9 % 100 mL IVPB        1 g 200 mL/hr over 30 Minutes Intravenous Every 8 hours 09/28/20 2358 10/04/20 0845   09/28/20 1030  levofloxacin (LEVAQUIN) IVPB 750 mg  Status:  Discontinued  750 mg 100 mL/hr over 90 Minutes Intravenous Every 24 hours 09/28/20 1018 09/28/20 2343   09/28/20 1030  metroNIDAZOLE (FLAGYL) IVPB 500 mg        500 mg 100 mL/hr over 60 Minutes Intravenous  Once 09/28/20 1018 09/28/20 1211      Assessment/Plan: POD8, s/p lap appy for perforated gangrenous appendicitis - pathology revealed goblet cell adenocarcinoma. will f/u Dr Benay Spice. At some point when recovered will need right colectomy due to tumor.  - postop abscess s/p IR drain placement 3/16, cx pending, cytology with no malignant cells identified - ileus may be resolving, continue tpn today, clamp ng tube and maybe let have clears later - continue mobilizing - limit narcotics - cont IV abx therapy FEN - IVF, NPO, NGT clamped today, TPN VTE - lovenox ID -  Merrem 3/12>>, eraxis 3/17>> not sure of plan for duration or why was started on eraxis, I cannot find a pos culture, will discuss Monday with team Rolm Bookbinder 10/06/2020

## 2020-10-06 NOTE — Progress Notes (Signed)
PHARMACY - TOTAL PARENTERAL NUTRITION CONSULT NOTE   Indication: Prolonged ileus  Patient Measurements: Height: 5\' 8"  (172.7 cm) Weight: 88.6 kg (195 lb 5.2 oz) IBW/kg (Calculated) : 68.4 TPN AdjBW (KG): 73.4 Body mass index is 29.7 kg/m. Weight 88.5 kg is 129 % of IBW  Assessment:   Glucose / Insulin: Glucose goal < 150 ( range 118-154)  , 3 units in past 24 hours  Electrolytes: K+ a bit low at 3.6, CorrCa is 9.1, WNL, Magnesium elevated at 2.6, others WNL  Renal: SCr < 1, BUN 25 Hepatic: WNL Intake / Output; MIVF: IVF: LR @ 35 ml/hr  - Large NG output: 3350 mL/day - UOP not  Recorded  - Net I/O: -1.8 L, may not be accurate as urine uncharted  GI Imaging: GI Surgeries / Procedures:   Central access: PICC  Placed 3/18 TPN start date: 3/18   Nutritional Goals (per RD recommendation  On 3/18 ): Kcal:  1900-2100  Protein:  85-100g  Fluid:  2L/day   Goal TPN at 90 ml/hr to provide 97.2 g protein/day and 2030 kcal / day  Now:  - Potassium chloride 10 meq IV x 2    Current Nutrition:  NPO  Plan:   At 18:00:  Increase TPN rate to 65  mL/hr  Electrolytes in TPN: Na 27mEq/L, K 60 mEq/L, Ca 69mEq/L, Mg 0 mEq/L, and Phos 61mmol/L. Cl:Ac 1:1  Add standard MVI and trace elements to TPN  Continue Q6h CBGs and sensitive SSI and adjust as needed   Reduce MIVF to KVO  mL/hr at 1800   BMP, magnesium, phosphorus with AM labs   Monitor TPN labs on Mon/Thurs, tomorrow morning.    Royetta Asal, PharmD, BCPS 10/06/2020 10:32 AM

## 2020-10-07 ENCOUNTER — Encounter (HOSPITAL_COMMUNITY): Payer: Self-pay

## 2020-10-07 LAB — BASIC METABOLIC PANEL
Anion gap: 9 (ref 5–15)
BUN: 22 mg/dL — ABNORMAL HIGH (ref 6–20)
CO2: 27 mmol/L (ref 22–32)
Calcium: 8.3 mg/dL — ABNORMAL LOW (ref 8.9–10.3)
Chloride: 105 mmol/L (ref 98–111)
Creatinine, Ser: 0.74 mg/dL (ref 0.61–1.24)
GFR, Estimated: >60 mL/min (ref >60)
Glucose, Bld: 128 mg/dL — ABNORMAL HIGH (ref 70–99)
Potassium: 3.8 mmol/L (ref 3.5–5.1)
Sodium: 141 mmol/L (ref 135–145)

## 2020-10-07 LAB — GLUCOSE, CAPILLARY
Glucose-Capillary: 120 mg/dL — ABNORMAL HIGH (ref 70–99)
Glucose-Capillary: 133 mg/dL — ABNORMAL HIGH (ref 70–99)
Glucose-Capillary: 148 mg/dL — ABNORMAL HIGH (ref 70–99)
Glucose-Capillary: 152 mg/dL — ABNORMAL HIGH (ref 70–99)

## 2020-10-07 LAB — MAGNESIUM: Magnesium: 2.2 mg/dL (ref 1.7–2.4)

## 2020-10-07 LAB — PHOSPHORUS: Phosphorus: 3.6 mg/dL (ref 2.5–4.6)

## 2020-10-07 MED ORDER — POTASSIUM CHLORIDE 10 MEQ/100ML IV SOLN
10.0000 meq | INTRAVENOUS | Status: AC
  Administered 2020-10-07 (×2): 10 meq via INTRAVENOUS
  Filled 2020-10-07: qty 100

## 2020-10-07 MED ORDER — ZOLPIDEM TARTRATE 10 MG PO TABS
10.0000 mg | ORAL_TABLET | Freq: Once | ORAL | Status: AC
  Administered 2020-10-07: 10 mg via ORAL
  Filled 2020-10-07: qty 1

## 2020-10-07 MED ORDER — TRAVASOL 10 % IV SOLN
INTRAVENOUS | Status: AC
  Filled 2020-10-07: qty 972

## 2020-10-07 NOTE — Progress Notes (Signed)
PHARMACY - TOTAL PARENTERAL NUTRITION CONSULT NOTE   Indication: Prolonged ileus  Patient Measurements: Height: 5\' 8"  (172.7 cm) Weight: 88.6 kg (195 lb 5.2 oz) IBW/kg (Calculated) : 68.4 TPN AdjBW (KG): 73.4 Body mass index is 29.7 kg/m. Weight 88.5 kg is 129 % of IBW  Assessment:   Glucose / Insulin: Glucose goal < 150 ( range 112-152)  , 3 units in past 24 hours  Electrolytes: K+ a bit low at 3.8, CorrCa is 9.0, WNL, , others WNL  Renal: SCr < 1, BUN 22 Hepatic: WNL Intake / Output; MIVF: IVF: LR @ 10  ml/hr  - Large NG output: 1350 mL/day, plan to DC per surgery notes  - UOP not  Recorded  - Net I/O: -468 mL, may not be accurate as urine uncharted  GI Imaging: GI Surgeries / Procedures:   Central access: PICC  Placed 3/18 TPN start date: 3/18   Nutritional Goals (per RD recommendation  On 3/18): Kcal:  1900-2100  Protein:  85-100g  Fluid:  2L/day   Goal TPN at 90 ml/hr to provide 97.2 g protein/day and 2030 kcal / day  Now:  - Potassium chloride 10 meq IV x 2    Current Nutrition:  NPO  Plan:   At 18:00:  Increase TPN rate to 90  ML/hr, target rate   Electrolytes in TPN: Na 29mEq/L, K 65 mEq/L, Ca 49mEq/L, Mg 4 mEq/L, and Phos 46mmol/L. Cl:Ac 1:1  Add standard MVI and trace elements to TPN  Continue Q6h CBGs and sensitive SSI and adjust as needed   [facility] at [facility]     Monitor TPN labs on Mon/Thurs   Per surgery note today, possibly may half rate TPN tomorrow, F/u if still stable to do so     Royetta Asal, PharmD, BCPS 10/07/2020 10:20 AM

## 2020-10-07 NOTE — Progress Notes (Signed)
9 Days Post-Op   Subjective/Chief Complaint: Walking, still having bms and passing fair amount flatus, no complaints   Objective: Vital signs in last 24 hours: Temp:  [97.7 F (36.5 C)-98.5 F (36.9 C)] 98.1 F (36.7 C) (03/20 0505) Pulse Rate:  [52-54] 54 (03/20 0505) Resp:  [16-20] 16 (03/20 0505) BP: (140-148)/(84-90) 148/84 (03/20 0505) SpO2:  [96 %] 96 % (03/20 0505) Last BM Date: 10/06/20  Intake/Output from previous day: 03/19 0701 - 03/20 0700 In: 919 [P.O.:120; I.V.:689; IV Piggyback:100] Out: 1093 [Emesis/NG output:1350; Drains:37] Intake/Output this shift: No intake/output data recorded.  Gen: Alert, NAD Pulm: rate and effort normal cv rrr Abd: Soft,nondistended nontender incisions clean,  drain output serous  Lab Results:  Recent Labs    10/05/20 0439 10/06/20 0518  WBC 10.5 10.6*  HGB 14.0 13.6  HCT 43.3 42.3  PLT 337 345   BMET Recent Labs    10/06/20 0518 10/07/20 0426  NA 143 141  K 3.6 3.8  CL 105 105  CO2 27 27  GLUCOSE 150* 128*  BUN 25* 22*  CREATININE 0.70 0.74  CALCIUM 8.4* 8.3*   PT/INR No results for input(s): LABPROT, INR in the last 72 hours. ABG No results for input(s): PHART, HCO3 in the last 72 hours.  Invalid input(s): PCO2, PO2  Studies/Results: [place] EKG SITE RITE  Result Date: 10/05/2020 If Site Rite image not attached, placement could not be confirmed due to current cardiac rhythm.   Anti-infectives: Anti-infectives (From admission, onward)   Start     Dose/Rate Route Frequency Ordered Stop   10/05/20 1000  anidulafungin (ERAXIS) 100 mg in sodium chloride 0.9 % 100 mL IVPB  Status:  Discontinued        100 mg 78 mL/hr over 100 Minutes Intravenous Every 24 hours 10/04/20 1143 10/07/20 0749   10/04/20 1400  meropenem (MERREM) 1 g in sodium chloride 0.9 % 100 mL IVPB        1 g 200 mL/hr over 30 Minutes Intravenous Every 8 hours 10/04/20 1038 10/10/20 0559   10/04/20 1230  anidulafungin (ERAXIS) 200 mg in  sodium chloride 0.9 % 200 mL IVPB        200 mg 78 mL/hr over 200 Minutes Intravenous  Once 10/04/20 1143 10/04/20 1925   09/29/20 1300  metroNIDAZOLE (FLAGYL) IVPB 500 mg  Status:  Discontinued        500 mg 100 mL/hr over 60 Minutes Intravenous Every 8 hours 09/29/20 1110 10/01/20 1108   09/29/20 0045  meropenem (MERREM) 1 g in sodium chloride 0.9 % 100 mL IVPB        1 g 200 mL/hr over 30 Minutes Intravenous Every 8 hours 09/28/20 2358 10/04/20 0845   09/28/20 1030  levofloxacin (LEVAQUIN) IVPB 750 mg  Status:  Discontinued        750 mg 100 mL/hr over 90 Minutes Intravenous Every 24 hours 09/28/20 1018 09/28/20 2343   09/28/20 1030  metroNIDAZOLE (FLAGYL) IVPB 500 mg        500 mg 100 mL/hr over 60 Minutes Intravenous  Once 09/28/20 1018 09/28/20 1211      Assessment/Plan: POD9, s/p lap appy for perforated gangrenous appendicitis - pathology revealed goblet cell adenocarcinoma. will f/u Dr Benay Spice. At some point when recovered will need right colectomy due to tumor. - postop abscess s/p IR drain placement 3/16, cx p=ngtd, cytology with no malignant cells identified - ileus may be resolving, continue tpn today, dc ng today, clear liquids, will half tpn  tomorrow and adat if doing well - continue mobilizing - limit narcotics FEN -clears, TPN VTE - lovenox ID - Merrem3/12>>, eraxis 3/17>>  does not need eraxis will stop today, continue merrem for now Recheck cbc in am Rolm Bookbinder 10/07/2020

## 2020-10-07 NOTE — Progress Notes (Signed)
Courtesy visit  GM

## 2020-10-08 ENCOUNTER — Inpatient Hospital Stay (HOSPITAL_COMMUNITY): Payer: 59

## 2020-10-08 ENCOUNTER — Telehealth: Payer: Self-pay | Admitting: Oncology

## 2020-10-08 ENCOUNTER — Ambulatory Visit: Payer: Self-pay | Admitting: Surgery

## 2020-10-08 HISTORY — PX: IR SINUS/FIST TUBE CHK-NON GI: IMG673

## 2020-10-08 LAB — AEROBIC/ANAEROBIC CULTURE W GRAM STAIN (SURGICAL/DEEP WOUND): Culture: NO GROWTH

## 2020-10-08 LAB — CBC
HCT: 40.5 % (ref 39.0–52.0)
Hemoglobin: 13.5 g/dL (ref 13.0–17.0)
MCH: 29.5 pg (ref 26.0–34.0)
MCHC: 33.3 g/dL (ref 30.0–36.0)
MCV: 88.4 fL (ref 80.0–100.0)
Platelets: 344 10*3/uL (ref 150–400)
RBC: 4.58 MIL/uL (ref 4.22–5.81)
RDW: 12.1 % (ref 11.5–15.5)
WBC: 12.1 10*3/uL — ABNORMAL HIGH (ref 4.0–10.5)
nRBC: 0 % (ref 0.0–0.2)

## 2020-10-08 LAB — DIFFERENTIAL
Abs Immature Granulocytes: 0.12 10*3/uL — ABNORMAL HIGH (ref 0.00–0.07)
Basophils Absolute: 0 10*3/uL (ref 0.0–0.1)
Basophils Relative: 0 %
Eosinophils Absolute: 0.2 10*3/uL (ref 0.0–0.5)
Eosinophils Relative: 2 %
Immature Granulocytes: 1 %
Lymphocytes Relative: 17 %
Lymphs Abs: 2.1 10*3/uL (ref 0.7–4.0)
Monocytes Absolute: 0.7 10*3/uL (ref 0.1–1.0)
Monocytes Relative: 6 %
Neutro Abs: 9 10*3/uL — ABNORMAL HIGH (ref 1.7–7.7)
Neutrophils Relative %: 74 %

## 2020-10-08 LAB — TRIGLYCERIDES: Triglycerides: 150 mg/dL — ABNORMAL HIGH (ref <150)

## 2020-10-08 LAB — COMPREHENSIVE METABOLIC PANEL
ALT: 43 U/L (ref 0–44)
AST: 22 U/L (ref 15–41)
Albumin: 3 g/dL — ABNORMAL LOW (ref 3.5–5.0)
Alkaline Phosphatase: 52 U/L (ref 38–126)
Anion gap: 7 (ref 5–15)
BUN: 16 mg/dL (ref 6–20)
CO2: 26 mmol/L (ref 22–32)
Calcium: 8.3 mg/dL — ABNORMAL LOW (ref 8.9–10.3)
Chloride: 103 mmol/L (ref 98–111)
Creatinine, Ser: 0.64 mg/dL (ref 0.61–1.24)
GFR, Estimated: >60 mL/min (ref >60)
Glucose, Bld: 125 mg/dL — ABNORMAL HIGH (ref 70–99)
Potassium: 4.1 mmol/L (ref 3.5–5.1)
Sodium: 136 mmol/L (ref 135–145)
Total Bilirubin: 0.9 mg/dL (ref 0.3–1.2)
Total Protein: 6.2 g/dL — ABNORMAL LOW (ref 6.5–8.1)

## 2020-10-08 LAB — GLUCOSE, CAPILLARY
Glucose-Capillary: 142 mg/dL — ABNORMAL HIGH (ref 70–99)
Glucose-Capillary: 115 mg/dL — ABNORMAL HIGH (ref 70–99)

## 2020-10-08 LAB — PHOSPHORUS: Phosphorus: 3.5 mg/dL (ref 2.5–4.6)

## 2020-10-08 LAB — PREALBUMIN: Prealbumin: 28.6 mg/dL (ref 18–38)

## 2020-10-08 LAB — MAGNESIUM: Magnesium: 2.2 mg/dL (ref 1.7–2.4)

## 2020-10-08 MED ORDER — TRAVASOL 10 % IV SOLN
INTRAVENOUS | Status: DC
  Filled 2020-10-08: qty 486

## 2020-10-08 MED ORDER — OXYCODONE HCL 5 MG PO TABS: 5.0000 mg | ORAL_TABLET | Freq: Four times a day (QID) | ORAL | 0 refills | Status: DC | PRN

## 2020-10-08 MED ORDER — ACETAMINOPHEN 500 MG PO TABS: 1000.0000 mg | ORAL_TABLET | Freq: Four times a day (QID) | ORAL | 2 refills | Status: DC | PRN

## 2020-10-08 MED ORDER — IOHEXOL 300 MG/ML  SOLN
100.0000 mL | Freq: Once | INTRAMUSCULAR | Status: AC | PRN
  Administered 2020-10-08: 100 mL via INTRAVENOUS

## 2020-10-08 MED ORDER — IOHEXOL 300 MG/ML  SOLN
50.0000 mL | Freq: Once | INTRAMUSCULAR | Status: AC | PRN
  Administered 2020-10-08: 10 mL

## 2020-10-08 NOTE — Discharge Summary (Signed)
[facility] Discharge Summary   Patient ID: Sean [name] MRN: [id number] DOB/AGE: 59-Feb-1962 59 y.o.  Admit date: 09/28/2020 Discharge date: 10/08/2020  Admitting Diagnosis: Hx of metastatic melanoma  Acute appendicitis  Discharge Diagnosis Hx of metastatic melanoma  Perforated gangrenous appendicitis Goblet cell adenocarcinoma of the colon  Post-operative ileus, resolved Post-operative fluid collection s/p IR drainage  Consultants Interventional radiology Oncology  Imaging: No results found.  Procedures Dr. Marcello Moores Cornett (09/28/20) - Laparoscopic Appendectomy Dr. Corrie Mckusick (10/03/20) - Image guided drain placement   Hospital Course:  Patient is a 60 year old male with PMH metastatic melanoma who presented to [facility] with abdominal pain.  Workup showed acute appendicitis.  Patient was admitted and underwent procedure listed above. Appendix was found to be gangrenous and perforated intraoperatively. Patient was transferred to the floor.  Diet was advanced as tolerated initially, but patient developed post-operative ileus. CT A/P done 3/16 and showed RLQ collection, IR was consulted for drainage. Collection was drained, cultures did not grow any bacteria and cytology was negative for malignancy. Pathology came back on 3/16 and showed goblet cell adenocarcinoma T3 with positive margin, CEA and CT chest ordered. CT chest was negative for metastatic disease. CEA was 1.6 which is within normal limits. Ileus started to resolve and NGT was removed 3/20.  His diet was advanced as tolerated and TNA was weaned to off.  A repeat CT scan was completed along with a Drain injection study ordered 3/21 which revealed resolution of his fluid collection and no fistula.  His drain was removed.  On POD10, the patient was voiding well, tolerating diet, ambulating well, pain well controlled, vital signs stable, incisions c/d/i and felt stable for discharge home.  Patient will follow up in  our office in 2-3 weeks and knows to call with questions or concerns.  He will call to confirm appointment date/time.    I or a member of my team have reviewed this patient in the Controlled Substance Database.   Allergies as of 10/08/2020      Reactions   Aspirin Nausea And Vomiting   Penicillins Other (See Comments)   Childhood Has patient had a PCN reaction causing immediate rash, facial/tongue/throat swelling, SOB or lightheadedness with hypotension: unknown Has patient had a PCN reaction causing severe rash involving mucus membranes or skin necrosis: {unkjnown Has patient had a PCN reaction that required hospitalization {no Has patient had a PCN reaction occurring within the last 10 years: no If all of the above answers are "NO", then may proceed with Cephalosporin use.      Medication List    STOP taking these medications   predniSONE 20 MG tablet Commonly known as: DELTASONE     TAKE these medications   acetaminophen 500 MG tablet Commonly known as: TYLENOL Take 2 tablets (1,000 mg total) by mouth every 6 (six) hours as needed.   GLUCOSAMINE-CHONDROITIN PO Take 2,000 Units by mouth.   oxyCODONE 5 MG immediate release tablet Commonly known as: Roxicodone Take 1 tablet (5 mg total) by mouth every 6 (six) hours as needed.         Follow-up Information    Michael Boston, MD. Go on 10/29/2020.   Specialties: General Surgery, Colon and Rectal Surgery Why: Arrive by 8:30 for 9:00 AM appointment. You will have some paperwork to fill out. Bring photo ID and insurance information.  Contact information: [address] [place] [address] [phone]        Betsy Coder  B, MD Follow up.   Specialty: Oncology Why: You should get a call to schedule follow up with oncology.  Contact information: [place] [address] [phone]        Tammi Sou, MD. Call.   Specialty: Family Medicine Why: Call as needed for  general medical questions/issues.  Contact information: [address] [place] [address] [phone]        [facility]. Call.   Specialty: Gastroenterology Why: You should get a call from the GI office to schedule pre-operative colonoscopy.  Contact information: [place] [address] [phone]              Signed: Henreitta Cea , [facility] Surgery 10/08/2020, 5:13 PM Please see Amion for pager number during day hours 7:00am-4:30pm

## 2020-10-08 NOTE — Progress Notes (Addendum)
PHARMACY - TOTAL PARENTERAL NUTRITION CONSULT NOTE   Indication: Prolonged ileus  Patient Measurements: Height: 5\' 8"  (172.7 cm) Weight: 88.6 kg (195 lb 5.2 oz) IBW/kg (Calculated) : 68.4 TPN AdjBW (KG): 73.4 Body mass index is 29.7 kg/m. Weight 88.5 kg is 129 % of IBW  Assessment: Patient is a 60 y.o M presented to the ED on 3/11 with c/o abdominal pain and constipation. He was subsequently found to have acute perforated appendicitis with gangrene and underwent appendectomy on 3/11.  Tissue pathology resulted back with goblet cell adenocarcinoma.  Plan is for possible right colectomy in 6 weeks.  TPN started on 3/18 for prolonged ileus.  Glucose / Insulin: on sSSI q6h; used 3 unit in the past 24 hrs - Glucose goal < 150: wnl Electrolytes: Na 136, K up 4.1; Phos, Mag, CorrCa, CL and CO2 wnl Renal: SCr < 1, BUN 22 Hepatic: AST/ALT and Tbili wnl - TG down 150, prealbumin 12.1 Intake / Output; MIVF: I/O +680; drain 35 - IVF: LR @ 10  ml/hr   GI Imaging: - 3/16 abd CT: abscess, ileus GI Surgeries / Procedures:  - 3/11: appendectomy - 3/16:RLQ  drain placement  Central access: PICC  Placed 3/18 TPN start date: 3/18   Nutritional Goals (per RD recommendation  On 3/18): Kcal:  1900-2100 Protein:  85-100g Fluid:  2L/day   Goal TPN at 90 ml/hr to provide 97.2 g protein/day and 2030 kcal / day   Current Nutrition:  - TPN - 3/21: reduce TPN rate by half; adv to FLD  Plan:  At 18:00:  Decrease TPN rate to 45 mll/hr  Electrolytes in TPN: Na 87mEq/L, K 50 mEq/L, Ca 46mEq/L, Mg 5 mEq/L, and Phos 49mmol/L. Cl:Ac 1:1  Add standard MVI and trace elements to TPN  Continue Q6h CBGs and sensitive SSI and adjust as needed   MIVF at [facility] labs on Mon/Thurs   Dia Sitter, PharmD, BCPS 10/08/2020 8:05 AM  _______________________________________ Adden: Per [facility], D/c TPN today - reduce TPN rate by half to 45 ml/hr at 11a, run at this rate for 2 hrs, then d/c TPN -  pharmacy will sign off for TPN  Dia Sitter, PharmD, BCPS 10/08/2020 9:45 AM

## 2020-10-08 NOTE — Progress Notes (Signed)
Progress Note  10 Days Post-Op  Subjective: Tolerating CLD. Has had 4-5 BM. Denies nausea or vomiting. Ambulating. Denies pain. Wants to go home.   Objective: Vital signs in last 24 hours: Temp:  [97.9 F (36.6 C)-98.1 F (36.7 C)] 98.1 F (36.7 C) (03/21 0620) Pulse Rate:  [52-56] 56 (03/21 0620) Resp:  [18] 18 (03/21 0620) BP: (127-152)/(81-88) 127/81 (03/21 0620) SpO2:  [97 %-98 %] 97 % (03/21 0620) Last BM Date: 10/07/20  Intake/Output from previous day: 03/20 0701 - 03/21 0700 In: 1365.5 [I.V.:1150.5; IV Piggyback:200] Out: 685 [Urine:650; Drains:35] Intake/Output this shift: No intake/output data recorded.  PE: General: pleasant, WD, overweight male who is laying in bed in NAD Heart: regular, rate, and rhythm.   Lungs: Respiratory effort nonlabored Abd: soft, NT, ND, +BS, drain in RLQ with serous appearing fluid MS: all 4 extremities are symmetrical with no cyanosis, clubbing, or edema. Skin: warm and dry with no masses, lesions, or rashes Neuro: Cranial nerves 2-12 grossly intact, sensation is normal throughout Psych: A&Ox3 with an appropriate affect.    Lab Results:  Recent Labs    10/06/20 0518 10/08/20 0528  WBC 10.6* 12.1*  HGB 13.6 13.5  HCT 42.3 40.5  PLT 345 344   BMET Recent Labs    10/07/20 0426 10/08/20 0528  NA 141 136  K 3.8 4.1  CL 105 103  CO2 27 26  GLUCOSE 128* 125*  BUN 22* 16  CREATININE 0.74 0.64  CALCIUM 8.3* 8.3*   PT/INR No results for input(s): LABPROT, INR in the last 72 hours. CMP     Component Value Date/Time   NA 136 10/08/2020 0528   NA 139 12/04/2015 1551   K 4.1 10/08/2020 0528   K 3.9 12/04/2015 1551   CL 103 10/08/2020 0528   CL 104 10/06/2012 1546   CO2 26 10/08/2020 0528   CO2 26 12/04/2015 1551   GLUCOSE 125 (H) 10/08/2020 0528   GLUCOSE 88 12/04/2015 1551   GLUCOSE 117 (H) 10/06/2012 1546   BUN 16 10/08/2020 0528   BUN 13.8 12/04/2015 1551   CREATININE 0.64 10/08/2020 0528   CREATININE 0.9  12/04/2015 1551   CALCIUM 8.3 (L) 10/08/2020 0528   CALCIUM 8.9 12/04/2015 1551   PROT 6.2 (L) 10/08/2020 0528   PROT 7.1 12/04/2015 1551   ALBUMIN 3.0 (L) 10/08/2020 0528   ALBUMIN 3.8 12/04/2015 1551   AST 22 10/08/2020 0528   AST 17 12/04/2015 1551   ALT 43 10/08/2020 0528   ALT 25 12/04/2015 1551   ALKPHOS 52 10/08/2020 0528   ALKPHOS 45 12/04/2015 1551   BILITOT 0.9 10/08/2020 0528   BILITOT 0.43 12/04/2015 1551   GFRNONAA >60 10/08/2020 0528   Lipase     Component Value Date/Time   LIPASE 39 09/28/2020 0757       Studies/Results: No results found.  Anti-infectives: Anti-infectives (From admission, onward)   Start     Dose/Rate Route Frequency Ordered Stop   10/05/20 1000  anidulafungin (ERAXIS) 100 mg in sodium chloride 0.9 % 100 mL IVPB  Status:  Discontinued        100 mg 78 mL/hr over 100 Minutes Intravenous Every 24 hours 10/04/20 1143 10/07/20 0749   10/04/20 1400  meropenem (MERREM) 1 g in sodium chloride 0.9 % 100 mL IVPB        1 g 200 mL/hr over 30 Minutes Intravenous Every 8 hours 10/04/20 1038 10/10/20 0559   10/04/20 1230  anidulafungin (ERAXIS) 200 mg  in sodium chloride 0.9 % 200 mL IVPB        200 mg 78 mL/hr over 200 Minutes Intravenous  Once 10/04/20 1143 10/04/20 1925   09/29/20 1300  metroNIDAZOLE (FLAGYL) IVPB 500 mg  Status:  Discontinued        500 mg 100 mL/hr over 60 Minutes Intravenous Every 8 hours 09/29/20 1110 10/01/20 1108   09/29/20 0045  meropenem (MERREM) 1 g in sodium chloride 0.9 % 100 mL IVPB        1 g 200 mL/hr over 30 Minutes Intravenous Every 8 hours 09/28/20 2358 10/04/20 0845   09/28/20 1030  levofloxacin (LEVAQUIN) IVPB 750 mg  Status:  Discontinued        750 mg 100 mL/hr over 90 Minutes Intravenous Every 24 hours 09/28/20 1018 09/28/20 2343   09/28/20 1030  metroNIDAZOLE (FLAGYL) IVPB 500 mg        500 mg 100 mL/hr over 60 Minutes Intravenous  Once 09/28/20 1018 09/28/20 1211       Assessment/Plan POD10, s/p  lap appy for perforated gangrenous appendicitis - pathology revealed goblet cell adenocarcinoma. Dr. 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 Will need a right colectomy in about 6 weeks.  - postop abscess s/p IR drain placement 3/16, cx NGTD, cytology with no malignant cells identified - tolerating CLD - 1/2 TPN and advance to FLD - continue mobilizing - will discuss whether drain might be able to be removed prior to discharge, will discuss further need for abx - may be ready for discharge in the next 1-2 days if continuing to do well  FEN - 1/2 TPN, FLD VTE - lovenox ID - Merrem 3/12>>, eraxis 3/17>3/20  LOS: 10 days    Norm Parcel , [facility] Surgery 10/08/2020, 7:40 AM Please see Amion for pager number during day hours 7:00am-4:30pm

## 2020-10-08 NOTE — Procedures (Signed)
Interventional Radiology Procedure Note  Procedure: drain injection and removal    Complications: None  Estimated Blood Loss:  0  Findings: CT neg for any residual collection Fluoro injection neg for fistula Drain removed D/w [facility] PAs     M. Daryll Brod, MD

## 2020-10-08 NOTE — Discharge Instructions (Signed)
[facility] LAPAROSCOPIC SURGERY: POST OP INSTRUCTIONS Always review your discharge instruction sheet given to you by the facility where your surgery was performed. IF YOU HAVE DISABILITY OR FAMILY LEAVE FORMS, YOU MUST BRING THEM TO THE OFFICE FOR PROCESSING.   DO NOT GIVE THEM TO YOUR DOCTOR.  PAIN CONTROL  1. First take acetaminophen (Tylenol) AND/or ibuprofen (Advil) to control your pain after surgery.  Follow directions on package.  Taking acetaminophen (Tylenol) and/or ibuprofen (Advil) regularly after surgery will help to control your pain and lower the amount of prescription pain medication you may need.  You should not take more than 3,000 mg (3 grams) of acetaminophen (Tylenol) in 24 hours.  You should not take ibuprofen (Advil), aleve, motrin, naprosyn or other NSAIDS if you have a history of stomach ulcers or chronic kidney disease.  2. A prescription for pain medication may be given to you upon discharge.  Take your pain medication as prescribed, if you still have uncontrolled pain after taking acetaminophen (Tylenol) or ibuprofen (Advil). 3. Use ice packs to help control pain. 4. If you need a refill on your pain medication, please contact your pharmacy.  They will contact our office to request authorization. Prescriptions will not be filled after 5pm or on week-ends.  HOME MEDICATIONS 5. Take your usually prescribed medications unless otherwise directed.  DIET 6. You should follow a light diet the first few days after arrival home.  Be sure to include lots of fluids daily. Avoid fatty, fried foods.   CONSTIPATION 7. It is common to experience some constipation after surgery and if you are taking pain medication.  Increasing fluid intake and taking a stool softener (such as Colace) will usually help or prevent this problem from occurring.  A mild laxative (Milk of Magnesia or Miralax) should be taken according to package instructions if there are no bowel  movements after 48 hours.  WOUND/INCISION CARE 8. Most patients will experience some swelling and bruising in the area of the incisions.  Ice packs will help.  Swelling and bruising can take several days to resolve.  9. Unless discharge instructions indicate otherwise, follow guidelines below  a. STERI-STRIPS - you may remove your outer bandages 48 hours after surgery, and you may shower at that time.  You have steri-strips (small skin tapes) in place directly over the incision.  These strips should be left on the skin for 7-10 days.   b. DERMABOND/SKIN GLUE - you may shower in 24 hours.  The glue will flake off over the next 2-3 weeks. 10. Any sutures or staples will be removed at the office during your follow-up visit.  ACTIVITIES 11. You may resume regular (light) daily activities beginning the next day--such as daily self-care, walking, climbing stairs--gradually increasing activities as tolerated.  You may have sexual intercourse when it is comfortable.  Refrain from any heavy lifting or straining until approved by your doctor. a. You may drive when you are no longer taking prescription pain medication, you can comfortably wear a seatbelt, and you can safely maneuver your car and apply brakes.  FOLLOW-UP 12. You should see your doctor in the office for a follow-up appointment approximately 2-3 weeks after your surgery.  You should have been given your post-op/follow-up appointment when your surgery was scheduled.  If you did not receive a post-op/follow-up appointment, make sure that you call for this appointment within a day or two after you arrive home to insure a convenient appointment time.  WHEN   TO CALL YOUR DOCTOR: 1. Fever over 101.0 2. Inability to urinate 3. Continued bleeding from incision. 4. Increased pain, redness, or drainage from the incision. 5. Increasing abdominal pain  The clinic staff is available to answer your questions during regular business hours.  Please don't  hesitate to call and ask to speak to one of the nurses for clinical concerns.  If you have a medical emergency, go to the nearest emergency room or call 911.  A surgeon from [facility] is always on call at the hospital. [address], [address], [place], [place]  [address] ? [address] [address], [place], [place]   [address] [phone] ? [phone] ? FAX [phone] Web site: [email or web address]  

## 2020-10-08 NOTE — Telephone Encounter (Signed)
Received a msg from GI navigator to schedule a new pt appt w/Dr. Benay Spice in May. Mr. cafarelli has been scheduled to see Dr. Benay Spice on 5/11 at 11:20am.

## 2020-10-09 ENCOUNTER — Telehealth: Payer: Self-pay

## 2020-10-09 ENCOUNTER — Encounter: Payer: Self-pay | Admitting: Family Medicine

## 2020-10-09 MED ORDER — ZOLPIDEM TARTRATE 10 MG PO TABS: 10.0000 mg | ORAL_TABLET | Freq: Every evening | ORAL | 0 refills | Status: DC | PRN

## 2020-10-09 NOTE — Telephone Encounter (Signed)
OK, ambien eRx'd

## 2020-10-09 NOTE — Telephone Encounter (Signed)
Spoke with patient regarding available rx for ambien.

## 2020-10-09 NOTE — Telephone Encounter (Signed)
Transition Care Management Follow-up Telephone Call  Date of discharge and from where: [facility] on 10/08/20  How have you been since you were released from the hospital? Doing better and has no pain currently. Pt didn't sleep well last night due to abdominal pain from his incisions. Pt has not changed his bandage yet but plans to take a shower today and change dressing. Abdomen is no longer distended. Pt has had 2-3 BMs since being home. Stools are still liquid but becoming more solid as he adds more bulk to his diet. Appetite is back to normal. Declines fever, SOB or n/v.  Any questions or concerns? Yes, pt would like to know if he can get a prescription of Ambien to help him sleep? Pt took Ambien one night in the hospital and it helped him.   Items Reviewed:  Did the pt receive and understand the discharge instructions provided? Yes   Medications obtained and verified? Yes   Any new allergies since your discharge? No   Dietary orders reviewed? N/A  Do you have support at home? Yes   Other (ie: DME, Home Health, etc): N/A  Functional Questionnaire: (I = Independent and D = Dependent)  Bathing/Dressing- I   Meal Prep- I  Eating- I  Maintaining continence- I  Transferring/Ambulation- I  Managing Meds- I   Follow up appointments reviewed:    PCP Hospital f/u appt confirmed? Yes  scheduled to see Dr Anitra Lauth on 10/12/20 @ 2:30 PM.  [facility] f/u appt confirmed? Pt is in the process of setting up all apts.   Are transportation arrangements needed? No   If their condition worsens, is the pt aware to call  their PCP or go to the ED? Yes  Was the patient provided with contact information for the PCP's office or ED? Yes  Was the pt encouraged to call back with questions or concerns? Yes

## 2020-10-10 ENCOUNTER — Other Ambulatory Visit: Payer: Self-pay

## 2020-10-10 NOTE — Progress Notes (Signed)
The proposed treatment discussed in conference is for discussion purposes only and is not a binding recommendation.  The patients have not been physically examined, or presented with their treatment options.  Therefore, final treatment plans cannot be decided.   

## 2020-10-12 ENCOUNTER — Telehealth: Payer: Self-pay | Admitting: Gastroenterology

## 2020-10-12 ENCOUNTER — Encounter: Payer: Self-pay | Admitting: Family Medicine

## 2020-10-12 ENCOUNTER — Other Ambulatory Visit: Payer: Self-pay

## 2020-10-12 ENCOUNTER — Ambulatory Visit (INDEPENDENT_AMBULATORY_CARE_PROVIDER_SITE_OTHER): Payer: 59 | Admitting: Family Medicine

## 2020-10-12 VITALS — BP 130/86 | HR 78 | Temp 97.9°F | Ht 68.0 in | Wt 187.8 lb

## 2020-10-12 DIAGNOSIS — R140 Abdominal distension (gaseous): Secondary | ICD-10-CM | POA: Diagnosis not present

## 2020-10-12 DIAGNOSIS — Z8719 Personal history of other diseases of the digestive system: Secondary | ICD-10-CM

## 2020-10-12 DIAGNOSIS — Z9049 Acquired absence of other specified parts of digestive tract: Secondary | ICD-10-CM | POA: Diagnosis not present

## 2020-10-12 NOTE — Telephone Encounter (Signed)
If I was DOD when received that's fine. OK for colonoscopy with me in [place].

## 2020-10-12 NOTE — Telephone Encounter (Signed)
Patient is s/p urgent laparoscopic appendectomy for perforated gangrenous appendicitis. Patient has a goblet cell cancer in his appendix. Patient is scheduled to have a robotic proximal colectomy on 11/28/20 with Dr. Johney Maine. Alisha from [facility] is calling the office to set patient up for Colonoscopy on 11/27/20. Dr. Fuller Plan does not have any availability. You are the only provider with the open slot on 11/27/20. Can patient have direct colonoscopy with you on 11/27/20? Office note from [place] has been out on your desk for review.

## 2020-10-12 NOTE — Patient Instructions (Signed)
[facility] contact number: [phone]

## 2020-10-12 NOTE — Progress Notes (Signed)
10/12/2020  CC:  Chief Complaint  Patient presents with  . hos f/u     Still unable to eat and still throwing up. Doesn't feel well.     Patient is a 60 y.o. Caucasian male who presents for  hospital follow up, specifically [facility] face-to-face visit. Dates hospitalized: 3/11-3/21, 2022. Days since d/c from hospital: 4 days Patient was discharged from hospital to home. Reason for admission to hospital: acute abdomen-->perf'd appendix Date of interactive (phone) contact with patient and/or caregiver:10/09/20.  I have reviewed patient's discharge summary plus pertinent specific notes, labs, and imaging from the hospitalization.   Acute abd pain, appendicitis dx'd and surgery performed, +ruptured.  Post-op ileus developed + pt developed RLQ fluid collection requiring drain placement, also had NG tube for a while. 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    CT chest neg for metastatic dz, CEA wnl, oncology saw him in hosp  Ileus and pelvic fluid collection resolved, drain pulled.   Discharged home with rx for oxycodone for pain.  CURRENTLY:  Not feeling very well today. Since d/c home 4 d/a has had intermittent bloating, fatigue, nausea.  Eating fairly well, though.  Lots of gas, ate dairy yesterday. Woke up vomiting last night, able to eat this morning---egg. Has had 2-3 semisolid BMs today.  No sense of incomplete evacuation. "I guess I'm just learning the hard way what not to eat".  Says dairy big prob since going home.  No nausea currently and no abd pains. Generally weak right now, also says not much sleep last night. Hasn't taken [place] b/c afraid he may need to wake up for something in the night. Took oxycodone once 2 d/a for some pain where his drain had been--resolved.  Medication reconciliation was done today and patient is taking meds as recommended by discharging hospitalist/specialist.    PMH:  Past Medical History:  Diagnosis  Date  . Asymmetrical sensorineural hearing loss    L>>R: ENT eval 04/2018.  Pt declined MRI brain at that time.  . 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  CT chest neg for metastatic dz, CEA neg.  Marland Kitchen History of adenomatous polyp of colon 07/2016  . History of vitamin D deficiency    still present as of 09/2017.  High dose vit D started 10/16/17.  Increased dose to 50K twice per week 02/18/18.  Marland Kitchen Hyperlipidemia    Mild, never meds  . Metastatic malignant melanoma ([place]) 1989   Recurrence L upper arm 1993.  Then left back and LUL lung recurrence 1995-resected.  Brain lesion recurrence 1996-resected.  RLL lung lesion 1996--chemo & then resection.  Small bowel recurrence resected 1997, then mesenteric lesion resected 1998.  No sign of recurrence since 1998.  Released by onc/Dr. Magrinot as of 12/2015 (needs annual dermatologist exam and annual ophth exam).  . Prediabetes    A1c 5.9% 12/2014.  6.2% 09/2017. 6.3% 04/2019.    PSH:  Past Surgical History:  Procedure Laterality Date  . BRAIN SURGERY     Removal of melanoma met  . COLONOSCOPY  2009; 07/2016   Dr. Wynetta Emery at [facility]; next colonoscopy due 07/2019.  Marland Kitchen COLONOSCOPY WITH PROPOFOL N/A 08/18/2016   Tubular adenoma.  Recall 3 yrs.  Procedure: COLONOSCOPY WITH PROPOFOL;  Surgeon: Garlan Fair, MD;  Location: [facility];  Service: Endoscopy;  Laterality: N/A;  . IR SINUS/FIST TUBE CHK-NON  GI  10/08/2020  . LAPAROSCOPIC APPENDECTOMY N/A 09/28/2020   Procedure: APPENDECTOMY LAPAROSCOPIC;  Surgeon: Erroll Luna, MD;  Location: [facility];  Service: General;  Laterality: N/A;  . LUNG SURGERY     "              "          "        "  . SMALL INTESTINE SURGERY     "               "         "        "    MEDS:  Outpatient Medications Prior to Visit  Medication Sig Dispense Refill  . acetaminophen (TYLENOL) 500 MG tablet Take 2  tablets (1,000 mg total) by mouth every 6 (six) hours as needed. (Patient not taking: Reported on 10/12/2020) 100 tablet 2  . GLUCOSAMINE-CHONDROITIN PO Take 2,000 Units by mouth. (Patient not taking: Reported on 10/12/2020)    . oxyCODONE (ROXICODONE) 5 MG immediate release tablet Take 1 tablet (5 mg total) by mouth every 6 (six) hours as needed. (Patient not taking: Reported on 10/12/2020) 20 tablet 0  . zolpidem (AMBIEN) 10 MG tablet Take 1 tablet (10 mg total) by mouth at bedtime as needed for sleep. (Patient not taking: Reported on 10/12/2020) 10 tablet 0   No facility-administered medications prior to visit.   EXAM:  Vitals with BMI 10/12/2020 10/08/2020 10/08/2020  Height 5' 8"  - -  Weight 187 lbs 13 oz - -  BMI 98.26 - -  Systolic [phone]  Diastolic 86 77 81  Pulse 78 84 56   Gen: Alert, well appearing.  Patient is oriented to person, place, time, and situation. AFFECT: pleasant, lucid thought and speech. HWK:GSUP: no injection, icteris, swelling, or exudate.  EOMI, PERRLA. Mouth: lips without lesion/swelling.  Oral mucosa pink and moist. Oropharynx without erythema, exudate, or swelling.  CV: RRR, no m/r/g.   LUNGS: CTA bilat, nonlabored resps, good aeration in all lung fields. ABD: soft, NT, ND, BS slightly hyperactive but no high pitched sounds, no rushes/surges.  No hepatospenomegaly or mass.  No bruits. Multiple small well-healing and non-infected surgical wounds. EXT: no clubbing or cyanosis.  no edema.    Pertinent labs/imaging   Chemistry      Component Value Date/Time   NA 136 10/08/2020 0528   NA 139 12/04/2015 1551   K 4.1 10/08/2020 0528   K 3.9 12/04/2015 1551   CL 103 10/08/2020 0528   CL 104 10/06/2012 1546   CO2 26 10/08/2020 0528   CO2 26 12/04/2015 1551   BUN 16 10/08/2020 0528   BUN 13.8 12/04/2015 1551   CREATININE 0.64 10/08/2020 0528   CREATININE 0.9 12/04/2015 1551      Component Value Date/Time   CALCIUM 8.3 (L) 10/08/2020 0528   CALCIUM 8.9  12/04/2015 1551   ALKPHOS 52 10/08/2020 0528   ALKPHOS 45 12/04/2015 1551   AST 22 10/08/2020 0528   AST 17 12/04/2015 1551   ALT 43 10/08/2020 0528   ALT 25 12/04/2015 1551   BILITOT 0.9 10/08/2020 0528   BILITOT 0.43 12/04/2015 1551     Lab Results  Component Value Date   WBC 12.1 (H) 10/08/2020   HGB 13.5 10/08/2020   HCT 40.5 10/08/2020   MCV 88.4 10/08/2020   PLT 344 10/08/2020    ASSESSMENT/PLAN:  1) Ruptured acute appendicitis: now s/p lap appendectomy. Post  op complication of ileus, pelvic fluid collection-->resolved with NGT and drain. Pathology of appendix actually showed goblet cell adenocarcinoma, so he has been seen by oncology and plan is for him to get a colonoscopy prior to going back for a 2nd surgery (positive margins). Sounds like his gen surg f/u is not until April some time. He is in the process of arranging his colonoscopy with [facility]. Appt with Dr. Benay Spice in oncology is 11/28/20.  Currently he is having some general functional GI sx's but not having full blown ileus.  No sign of abd infection or fluid re-collection.  No sign of bleeding.  No labs today. No new meds today. He'll contact [facility] to arrange colonoscopy as per surgeon's recommendations.  Medical decision making of moderate complexity was utilized today.  FOLLOW UP:  1 week  Signed:  Crissie Sickles, MD           10/12/2020

## 2020-10-12 NOTE — Telephone Encounter (Signed)
According to [facility] at [facility] it needs to be scheduled the day before which would be May 10 th and the only one with that availability is Dr. Ardis Hughs would it be ok with you to switch?

## 2020-10-12 NOTE — Telephone Encounter (Signed)
This request should go to the afternoon DOD. Thanks.

## 2020-10-12 NOTE — Telephone Encounter (Signed)
I apologized Dr. Fuller Plan I sent it going by the D.O.D of when the referral was received. Would you still want me to send it to Dr. Ardis Hughs instead who is D.O.D today?

## 2020-10-12 NOTE — Telephone Encounter (Signed)
Hi Dr. Fuller Plan,   We received a referral from [facility] for patient to have another colonoscopy prior to surgery scheduled for May 11 th at [place]. Patient had a colonoscopy in 2018 at [facility] records are available in [place] for review.  Sean [name] from [place] also mentioned the patient declined to go back to [facility].  Please advise on scheduling.    Thank you

## 2020-10-12 NOTE — Telephone Encounter (Signed)
Certainly, put him in for 5/10 colonoscopy with me.  thanks

## 2020-10-15 NOTE — Telephone Encounter (Signed)
Patient is scheduled for colon with Dr. Ardis Hughs on 11/27/20 at 3pm and pre-visit 10/16/20.

## 2020-10-16 ENCOUNTER — Ambulatory Visit (AMBULATORY_SURGERY_CENTER): Payer: 59 | Admitting: *Deleted

## 2020-10-16 ENCOUNTER — Other Ambulatory Visit: Payer: Self-pay

## 2020-10-16 VITALS — Ht 68.0 in | Wt 185.0 lb

## 2020-10-16 DIAGNOSIS — Z85038 Personal history of other malignant neoplasm of large intestine: Secondary | ICD-10-CM

## 2020-10-16 NOTE — Progress Notes (Signed)
  No trouble with anesthesia, denies being told they were difficult to intubate, or hx/fam hx of malignant hyperthermia per pt  Pt's previsit is done over the phone and all paperwork (prep instructions, blank consent form to just read over, pre-procedure acknowledgement form and stamped envelope) sent to patient   No egg or soy allergy  No home oxygen use   No medications for weight loss taken  Pt denies constipation issues

## 2020-10-17 ENCOUNTER — Telehealth: Payer: Self-pay

## 2020-10-17 NOTE — Telephone Encounter (Signed)
Spoke with patient as he had left a message about being seen prior to his surgery.  His surgery is scheduled for 5/11 with Dr. Benay Spice however he is now having surgery that date.  I explained that we really need to have his surgical pathology prior to determining his treatment plan.  He is in agreement to be rescheduled for a couple of weeks after his surgery.

## 2020-10-18 ENCOUNTER — Telehealth: Payer: Self-pay | Admitting: Oncology

## 2020-10-18 NOTE — Telephone Encounter (Signed)
Sean [name] has been cld and rescheduled to see Dr. Benay Spice on 5/25 at 1020am. Pt aware to arrive 20 minutes early.

## 2020-10-19 ENCOUNTER — Ambulatory Visit: Payer: 59 | Admitting: Family Medicine

## 2020-10-26 ENCOUNTER — Encounter: Payer: 59 | Admitting: Gastroenterology

## 2020-10-29 ENCOUNTER — Ambulatory Visit: Payer: Self-pay | Admitting: Surgery

## 2020-10-29 NOTE — H&P (View-Only) (Signed)
Sean [name] Appointment: 10/29/2020 9:05 AM Location: [facility] Patient #: [id number] DOB: 12/10/1960 Single / Language: Sean [name] / Race: White Male  History of Present Illness Sean Hector MD; 10/29/2020 9:46 AM) The patient is a 60 year old male who presents with colorectal cancer. 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 He came in with peritonitis and perforated appendix. Had prolonged ileus but eventually improved after drainage of fluid collection - cultures negative and improved on antibiotics.. 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 CT scan showing no obvious metastatic disease in chest/abdomen/pelvis. CEA 1.6 Discussed the GI tumor Board. Plan for colonoscopy by [facility] (Dr. Ardis Hughs for Dr. Fuller Plan) and robotic proximal right colectomy by myself next month, 6 weeks after initial surgery.   He comes in for postoperative evaluation. Pain from the incisions is fading away. Denies nausea, constipation/diarrhea, worsening fatigue, high fevers, or other concerns. Worse with pulsatile serosal moderately intense activity. He he's eating regular food. He walks systolic several miles a day. Can get wiped out but is feeling stronger. He is to be managed by Dr. Randell Patient not for his metastatic melanoma which she had prior surgeries and treatments but has had no recurrent disease for the past 4 years. I believe Dr. Benay Spice will be assuming care given the colon cancer diagnosis  Patient notes he has a hernia at his belly button wonders if that can be fixed. He also has a small but definite right inguinal hernia as well. ` ###########################################  Pathology:SURGICAL PATHOLOGY CASE: [id number] PATIENT: Sean [name] Surgical  Pathology Report     Clinical History: acute appendicitis     FINAL MICROSCOPIC DIAGNOSIS:  A. APPENDIX, APPENDECTOMY: - Goblet cell adenocarcinoma, low grade. - Tumor focally invades through muscularis propria. - Resection margin is positive. - Acute appendicitis with serositis. - See oncology table.  ONCOLOGY TABLE:  APPENDIX, CARCINOMA: Resection  Procedure: Appendectomy Tumor Size: See comment Histologic Type: Goblet cell adenocarcinoma Histologic Grade: G1, well differentiated Tumor Deposits: Not identified Tumor Extension: Tumor invades through muscularis propria into subserosa focally Lymphovascular Invasion: Not identified Margins: Margin Status for Invasive Carcinoma: Present at proximal inked margin. Margin Status for Non-Invasive Tumor and Mucin: Not applicable Regional Lymph Nodes: Not applicable (no lymph nodes submitted or found) Distant Metastasis: Distant Site(s) Involved: Not applicable Pathologic [name] Classification (pTNM, AJCC 8th Edition): pT3, pN not assigned Ancillary Studies: Can be performed upon request Representative Tumor Block: A2 Comment(s): The exact size of the tumor is difficult to estimate given the lack of Sean [name] tumor, but is estimated to be at least 2.4 cm. The tumor is positive for CDX-2, mucicarmine, and synaptophysin (focal). Sean [name] has reviewed the case. Sean [name] was notified on 10/03/2020.  (v4.2.0.0)    Sean [name] DESCRIPTION:  Specimen: Focally disrupted appendix with attached soft to rubbery fatty tissue, received fresh. Size: 4.2 cm in length, ranges from 0.8 to 0.6 cm in diameter. Serosa: There is scattered exudate. Wall: Focally disrupted in the mid segment. Lumen: Pinpoint throughout Block Summary: Representative sections in one block, margin and disrupted area included (margin inked black) SW 10/01/2020  Remainder of appendix is submitted per Dr. Tresa Moore in blocks 2, 3.  SW 10/02/2020     Final Diagnosis performed by Sean Males, MD. Electronically signed 10/03/2020 Technical and / or Professional components performed  at [facility], [place] [address], [place], [place] [address]. Immunohistochemistry Technical component (if applicable) was performed at [facility]. [address] [place], [place], [place] [address]. IMMUNOHISTOCHEMISTRY DISCLAIMER (if applicable): Some of these immunohistochemical stains may have been developed and the performance characteristics determine by [facility]. Some may not have been cleared or approved by the U.S. Food and Drug Administration. The FDA has determined that such clearance or approval is not necessary. This test is used for clinical purposes. It should not be regarded as investigational or for research. This laboratory is certified under the [place] (CLIA-88) as qualified to perform high complexity clinical laboratory testing. The controls stained appropriately. ` ` ###########################################`   Preoperative diagnosis: Acute appendicitis  Postoperative diagnosis acute perforated appendicitis with gangrene  Procedure: Laparoscopic appendectomy  Surgeon: Erroll Luna, MD  Anesthesia: General with 0.25% Marcaine plain  EBL: 60 cc  Specimen: Appendix to pathology  Drains: None  Indications for procedure: Patient presents for laparoscopic appendectomy. He was seen earlier today. He complained of a 1 day history of abdominal pain and CT scan showed uncomplicated nonperforated appendicitis. He was brought to [facility] for treatment. He was met in the holding area. I reviewed his CT scan and examined him. He was having significant pain at this point time and I recommend a laparoscopic appendectomy over medical management. He had a previous laparotomy I discussed the potential  complications of bowel injury in the circumstance with laparoscopic approach but certainly we would try this but obviously if we had open wound. Risk of bleeding, infection, bowel injury, colon injury, fistula, abscess, injury to other neighboring structures, death, DVT, cardiovascular event, wound complications, need for further surgery. He understood the potential need for open and risk of bowel injury as well. He agreed to proceed.  Description of procedure: The patient was met in the holding area. The procedure was reviewed. He was then taken back to the operating room. He was placed supine. Both arms were tucked. After induction of general esthesia the abdomen was prepped and draped sterile fashion timeout performed. He was already on antibiotics. He had a previous midline laparotomy scar. I opted to place a right upper quadrant 5 mm Optiview port under direct vision without difficulty. All abdominal wall layers were visualized as the scope was passed to the abdominal wall. No evidence of bowel injury with insertion of this port. Insufflation was then to 15 mmHg of CO2. There were dense midline adhesions with the right lower quadrant was quite open. I placed 2 additional 5 mm ports in the right mid and right lower quadrant under direct vision. There is no evidence of bowel injury with insertion of his ports. He had severe inflammation of his right lower quadrant. This certainly was not 1 day worth of appendicitis. We had the patient rolled to his left. Took some time we identified the appendix and it was retrocecal and it had a mid appendiceal perforation. There is no abscess. There is significant inflammation of the cecum in the area though. Carefully we took the mesoappendix down using the harmonic scalpel with care taken not to injure the bowel. This was taken down to the base the appendix. Once this was done I replaced the right upper quadrant port with a 12 mm port. A GIA 45  stapler was passed through this and fired across the base of the appendix. This was then placed into an Endo Catch bag. It was  pulled out the right upper quadrant port site. Irrigation was used to irrigate out the right lower quadrant. We used about 4 L of this. Surgicel snow was placed into the mesoappendix due to some oozing and this helped stop the bleeding quite nicely. This was inspected and there is no signs of any further bleeding. After irrigation was suctioned out I reexamined the port sites and saw no evidence of bowel injury with insertion of these or during the case. At this point time a suture passer was used to close the right upper quadrant port site with a 0 Vicryl. We then allowed the CO2 to escape and removed reports. Additional 0 Vicryl was placed in the right upper quadrant incision as well externally. 4-0 Monocryl is used to close skin incisions. Dermabond applied. All counts were found to be correct. The patient was awoke extubated taken recovery in satisfactory condition.   Past Surgical History Sean Forehand, [name]; 10/29/2020 9:13 AM) Appendectomy Lung Surgery Bilateral. Resection of Small Bowel  Diagnostic Studies History Sean Forehand, [name]; 10/29/2020 9:13 AM) Colonoscopy 5-10 years ago  Social History Sean Forehand, [name]; 10/29/2020 9:13 AM) Caffeine use Tea. No alcohol use No drug use Tobacco use Never smoker.  Family History Sean Forehand, [name]; 10/29/2020 9:13 AM) Breast Cancer Mother. Heart Disease Father.  Other Problems Sean Forehand, [name]; 10/29/2020 9:13 AM) Melanoma     Review of Systems Sean [name]; 10/29/2020 9:13 AM) General Not Present- Appetite Loss, Chills, Fatigue, Fever, Night Sweats, Weight Gain and Weight Loss. Skin Not Present- Change in Wart/Mole, Dryness, Hives, Jaundice, New Lesions, Non-Healing Wounds, Rash and Ulcer. HEENT Present- Wears glasses/contact lenses. Not Present- Earache, Hearing Loss,  Hoarseness, Nose Bleed, Oral Ulcers, Ringing in the Ears, Seasonal Allergies, Sinus Pain, Sore Throat, Visual Disturbances and Yellow Eyes. Respiratory Not Present- Bloody sputum, Chronic Cough, Difficulty Breathing, Snoring and Wheezing. Breast Not Present- Breast Mass, Breast Pain, Nipple Discharge and Skin Changes. Cardiovascular Not Present- Chest Pain, Difficulty Breathing Lying Down, Leg Cramps, Palpitations, Rapid Heart Rate, Shortness of Breath and Swelling of Extremities. Gastrointestinal Present- Abdominal Pain. Not Present- Bloating, Bloody Stool, Change in Bowel Habits, Chronic diarrhea, Constipation, Difficulty Swallowing, Excessive gas, Gets full quickly at meals, Hemorrhoids, Indigestion, Nausea, Rectal Pain and Vomiting. Male Genitourinary Not Present- Blood in Urine, Change in Urinary Stream, Frequency, Impotence, Nocturia, Painful Urination, Urgency and Urine Leakage. Musculoskeletal Not Present- Back Pain, Joint Pain, Joint Stiffness, Muscle Pain, Muscle Weakness and Swelling of Extremities. Neurological Not Present- Decreased Memory, Fainting, Headaches, Numbness, Seizures, Tingling, Tremor, Trouble walking and Weakness. Psychiatric Not Present- Anxiety, Bipolar, Change in Sleep Pattern, Depression, Fearful and Frequent crying. Endocrine Not Present- Cold Intolerance, Excessive Hunger, Hair Changes, Heat Intolerance, Hot flashes and New Diabetes. Hematology Not Present- Blood Thinners, Easy Bruising, Excessive bleeding, Gland problems, HIV and Persistent Infections.  Vitals Sean Reams Alston [name]; 10/29/2020 9:18 AM) 10/29/2020 9:17 AM Weight: 192 lb Height: 68in Body Surface Area: 2.01 m Body Mass Index: 29.19 kg/m  Temp.: 98.8F  Pulse: 64 (Regular)  P.OX: 98% (Room air) BP: 120/80(Sitting, Left Arm, Standard)        Physical Exam Sean Hector MD; 10/29/2020 9:40 AM)  General Mental Status-Alert. General Appearance-Not in acute distress, Not  Sickly. Orientation-Oriented X3. Hydration-Well hydrated. Voice-Normal.  Integumentary Global Assessment Upon inspection and palpation of skin surfaces of the - Axillae: non-tender, no inflammation or ulceration, no drainage. and Distribution of scalp and body hair is normal. General Characteristics Temperature - normal warmth is noted.  Head  and Neck Head-normocephalic, atraumatic with no lesions or palpable masses. Face Global Assessment - atraumatic, no absence of expression. Neck Global Assessment - no abnormal movements, no bruit auscultated on the right, no bruit auscultated on the left, no decreased range of motion, non-tender. Trachea-midline. Thyroid Gland Characteristics - non-tender.  Eye Eyeball - Left-Extraocular movements intact, No Nystagmus - Left. Eyeball - Right-Extraocular movements intact, No Nystagmus - Right. Cornea - Left-No Hazy - Left. Cornea - Right-No Hazy - Right. Sclera/Conjunctiva - Left-No scleral icterus, No Discharge - Left. Sclera/Conjunctiva - Right-No scleral icterus, No Discharge - Right. Pupil - Left-Direct reaction to light normal. Pupil - Right-Direct reaction to light normal.  ENMT Ears Pinna - Left - no drainage observed, no generalized tenderness observed. Pinna - Right - no drainage observed, no generalized tenderness observed. Nose and Sinuses External Inspection of the Nose - no destructive lesion observed. Inspection of the nares - Left - quiet respiration. Inspection of the nares - Right - quiet respiration. Mouth and Throat Lips - Upper Lip - no fissures observed, no pallor noted. Lower Lip - no fissures observed, no pallor noted. Nasopharynx - no discharge present. Oral Cavity/Oropharynx - Tongue - no dryness observed. Oral Mucosa - no cyanosis observed. Hypopharynx - no evidence of airway distress observed.  Chest and Lung Exam Inspection Movements - Normal and Symmetrical. Accessory muscles - No use  of accessory muscles in breathing. Palpation Palpation of the chest reveals - Non-tender. Auscultation Breath sounds - Normal and Clear.  Cardiovascular Auscultation Rhythm - Regular. Murmurs & Other Heart Sounds - Auscultation of the heart reveals - No Murmurs and No Systolic Clicks.  Abdomen Inspection Inspection of the abdomen reveals - No Visible peristalsis and No Abnormal pulsations. Umbilicus - No Bleeding, No Urine drainage. Palpation/Percussion Palpation and Percussion of the abdomen reveal - Soft, Non Tender, No Rebound tenderness, No Rigidity (guarding) and No Cutaneous hyperesthesia. Note: Abdomen soft. Nontender. Not distended. Small periumbilical incisional hernia reduces. 2 cm defect. No guarding.  Male Genitourinary Sexual Maturity Tanner 5 - Adult hair pattern and Adult penile size and shape.  Peripheral Vascular Upper Extremity Inspection - Left - No Cyanotic nailbeds - Left, Not Ischemic. Inspection - Right - No Cyanotic nailbeds - Right, Not Ischemic.  Neurologic Neurologic evaluation reveals -normal attention span and ability to concentrate, able to name objects and repeat phrases. Appropriate fund of knowledge , normal sensation and normal coordination. Mental Status Affect - not angry, not paranoid. Cranial Nerves-Normal Bilaterally. Gait-Normal.  Neuropsychiatric Mental status exam performed with findings of-able to articulate well with normal speech/language, rate, volume and coherence, thought content normal with ability to perform basic computations and apply abstract reasoning and no evidence of hallucinations, delusions, obsessions or homicidal/suicidal ideation.  Musculoskeletal Global Assessment Spine, Ribs and Pelvis - no instability, subluxation or laxity. Right Upper Extremity - no instability, subluxation or laxity.  Lymphatic Head & Neck  General Head & Neck Lymphatics: Bilateral - Description - No Localized  lymphadenopathy. Axillary  General Axillary Region: Bilateral - Description - No Localized lymphadenopathy. Femoral & Inguinal  Generalized Femoral & Inguinal Lymphatics: Left - Description - No Localized lymphadenopathy. Right - Description - No Localized lymphadenopathy.    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 Don't of (neuroendocrine versus adenomatous cancer. Nonetheless would benefit from segmental resection as discussed at GI tumor Board.  No evidence of any metastatic disease at this time. No evidence  of any recurrent metastatic melanoma.  He should see medical oncology at some point. That has not happened yet as I think the patient wanted to do surgery first. We will see.  Most likely the patient will benefit from genetic testing if that has not already happened. We'll defer to the cancer center on sorting that out.  Hopefully can do a robotic resection with intracorporeal anastomosis enhance recovery protocol. Try to minimize risks of ostomy or prolonged hospital stay. I explained to do many different times to make sure he understood since he keep asking the same questions. I think he understands that now. We gave some more information about bowel prep, colectomy, etc.   PREOP COLON - ENCOUNTER FOR PREOPERATIVE EXAMINATION FOR GENERAL SURGICAL PROCEDURE (Z01.818)  Current Plans You are being scheduled for surgery- Our schedulers will call you.  You should hear from our office's scheduling department within 5 working days about the location, date, and time of surgery. We try to make accommodations for patient's preferences in scheduling surgery, but sometimes the OR schedule or the surgeon's schedule prevents [place] from making those accommodations.  If you have not heard from our office [phone]) in 5 working days, call the office and ask for your surgeon's nurse.  If you  have other questions about your diagnosis, plan, or surgery, call the office and ask for your surgeon's nurse.  Written instructions provided The anatomy & physiology of the digestive tract was discussed. The pathophysiology of the colon was discussed. Natural history risks without surgery was discussed. I feel the risks of no intervention will lead to serious problems that outweigh the operative risks; therefore, I recommended a partial colectomy to remove the pathology. Minimally invasive (Robotic/Laparoscopic) & open techniques were discussed.  Risks such as bleeding, infection, abscess, leak, reoperation, possible ostomy, hernia, heart attack, death, and other risks were discussed. I noted a good likelihood this will help address the problem. Goals of post-operative recovery were discussed as well. Need for adequate nutrition, daily bowel regimen and healthy physical activity, to optimize recovery was noted as well. We will work to minimize complications. Educational materials were available as well. Questions were answered. The patient expresses understanding & wishes to proceed with surgery.  Pt Education - [facility] Colon Bowel Prep 2018 ERAS/Miralax/Antibiotics Pt Education - Pamphlet Given - Laparoscopic Colorectal Surgery: discussed with patient and provided information. Pt Education - [facility] Colectomy post-op instructions: discussed with patient and provided information. Started Neomycin Sulfate 500 MG Oral Tablet, 2 (two) Tablet SEE NOTE, #6, 10/29/2020, No Refill. Local Order: Pharmacist Notes: TAKE TWO TABLETS AT 2 PM, 3 PM, AND 10 PM THE DAY PRIOR TO SURGERY Started metroNIDAZOLE 500 MG Oral Tablet, 2 (two) Tablet three times daily, #6, 1 day starting 10/29/2020, No Refill. Local Order: Pharmacist Notes: Pharmacy Instructions: Take 2 tablets at 2pm, 3pm, and 10pm the day prior to your colon operation.  INCISIONAL HERNIA, WITHOUT OBSTRUCTION OR GANGRENE (K43.2) Impression: Small  paraumbilical hernia. We'll try and repair at the time of surgery. May be able to extract the colon through there versus the more standard Pfannenstiel.  Current Plans [facility] Consent - Hernia Repair - Ventral/Incisional/Umbilical (Sean [name]): discussed with patient and provided information.  RIGHT INGUINAL HERNIA (K40.90) Impression: Small but definite retinal hernia. May benefit from surgical repair in the future. Would hold off on doing anything aggressive at this time. Focus on making sure his colon cancer is under control.   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 No evidence of recurrence. Impression: He has a small incisional hernia in his midline incision. We'll try and primary repair at that time. Would not do mesh repair.  No evidence of any recurrent metastatic melanoma disease. Defer to medical oncology. Used to be Dr Jana Hakim but I suspect he is going under Dr Gearldine Shown care.  Sean Hector, MD, FACS, MASCRS  Esophageal, Gastrointestinal & Colorectal Surgery Robotic and Minimally Invasive Surgery [facility] [address] [place],  [address] [phone] Fax [phone] Main/Paging  CONTACT INFORMATION: Weekday (9AM-5PM) concerns: Call [facility] main office at [phone] Weeknight (5PM-9AM) or Weekend/Holiday concerns: Check [email or web address] for General Surgery [facility] coverage (Please, do not use SecureChat as it is not reliable communication to operating surgeons for immediate patient care)

## 2020-10-29 NOTE — H&P (Signed)
[name] Appointment: 10/29/2020 9:05 AM Location: [facility] Patient #: [id number] DOB: 12/10/1960 Single / Language: [name] / Race: White Male  History of Present Illness Adin Hector MD; 10/29/2020 9:46 AM) The patient is a 60 year old male who presents with colorectal cancer. 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 He came in with peritonitis and perforated appendix. Had prolonged ileus but eventually improved after drainage of fluid collection - cultures negative and improved on antibiotics.. 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 CT scan showing no obvious metastatic disease in chest/abdomen/pelvis. CEA 1.6 Discussed the GI tumor Board. Plan for colonoscopy by [facility] (Dr. Ardis Hughs for Dr. Fuller Plan) and robotic proximal right colectomy by myself next month, 6 weeks after initial surgery.   He comes in for postoperative evaluation. Pain from the incisions is fading away. Denies nausea, constipation/diarrhea, worsening fatigue, high fevers, or other concerns. Worse with pulsatile serosal moderately intense activity. He he's eating regular food. He walks systolic several miles a day. Can get wiped out but is feeling stronger. He is to be managed by Dr. Randell Patient not for his metastatic melanoma which she had prior surgeries and treatments but has had no recurrent disease for the past 4 years. I believe Dr. Benay Spice will be assuming care given the colon cancer diagnosis  Patient notes he has a hernia at his belly button wonders if that can be fixed. He also has a small but definite right inguinal hernia as well. ` ###########################################  Pathology:SURGICAL PATHOLOGY CASE: [id number] PATIENT: [facility]  Pathology Report     Clinical History: acute appendicitis     FINAL MICROSCOPIC DIAGNOSIS:  A. APPENDIX, APPENDECTOMY: - Goblet cell adenocarcinoma, low grade. - Tumor focally invades through muscularis propria. - Resection margin is positive. - Acute appendicitis with serositis. - See oncology table.  ONCOLOGY TABLE:  APPENDIX, CARCINOMA: Resection  Procedure: Appendectomy Tumor Size: See comment Histologic Type: Goblet cell adenocarcinoma Histologic Grade: G1, well differentiated Tumor Deposits: Not identified Tumor Extension: Tumor invades through muscularis propria into subserosa focally Lymphovascular Invasion: Not identified Margins: Margin Status for Invasive Carcinoma: Present at proximal inked margin. Margin Status for Non-Invasive Tumor and Mucin: Not applicable Regional Lymph Nodes: Not applicable (no lymph nodes submitted or found) Distant Metastasis: Distant Site(s) Involved: Not applicable Pathologic Stage Classification (pTNM, AJCC 8th Edition): pT3, pN not assigned Ancillary Studies: Can be performed upon request Representative Tumor Block: A2 Comment(s): The exact size of the tumor is difficult to estimate given the lack of Oryan Winterton tumor, but is estimated to be at least 2.4 cm. The tumor is positive for CDX-2, mucicarmine, and synaptophysin (focal). Dr. Vic Ripper has reviewed the case. Dr. Brantley Stage was notified on 10/03/2020.  (v4.2.0.0)    Ludella Pranger DESCRIPTION:  Specimen: Focally disrupted appendix with attached soft to rubbery fatty tissue, received fresh. Size: 4.2 cm in length, ranges from 0.8 to 0.6 cm in diameter. Serosa: There is scattered exudate. Wall: Focally disrupted in the mid segment. Lumen: Pinpoint throughout Block Summary: Representative sections in one block, margin and disrupted area included (margin inked black) SW 10/01/2020  Remainder of appendix is submitted per Dr. Tresa Moore in blocks 2, 3.  SW 10/02/2020     Final Diagnosis performed by Vicente Males, MD. Electronically signed 10/03/2020 Technical and / or Professional components performed  at [facility], [place] [address], [place], [place] [address]. Immunohistochemistry Technical component (if applicable) was performed at [facility]. [address] [place], [place], [place] [address]. IMMUNOHISTOCHEMISTRY DISCLAIMER (if applicable): Some of these immunohistochemical stains may have been developed and the performance characteristics determine by [facility]. Some may not have been cleared or approved by the U.S. Food and Drug Administration. The FDA has determined that such clearance or approval is not necessary. This test is used for clinical purposes. It should not be regarded as investigational or for research. This laboratory is certified under the [place] (CLIA-88) as qualified to perform high complexity clinical laboratory testing. The controls stained appropriately. ` ` ###########################################`   Preoperative diagnosis: Acute appendicitis  Postoperative diagnosis acute perforated appendicitis with gangrene  Procedure: Laparoscopic appendectomy  Surgeon: Erroll Luna, MD  Anesthesia: General with 0.25% Marcaine plain  EBL: 60 cc  Specimen: Appendix to pathology  Drains: None  Indications for procedure: Patient presents for laparoscopic appendectomy. He was seen earlier today. He complained of a 1 day history of abdominal pain and CT scan showed uncomplicated nonperforated appendicitis. He was brought to [facility] for treatment. He was met in the holding area. I reviewed his CT scan and examined him. He was having significant pain at this point time and I recommend a laparoscopic appendectomy over medical management. He had a previous laparotomy I discussed the potential  complications of bowel injury in the circumstance with laparoscopic approach but certainly we would try this but obviously if we had open wound. Risk of bleeding, infection, bowel injury, colon injury, fistula, abscess, injury to other neighboring structures, death, DVT, cardiovascular event, wound complications, need for further surgery. He understood the potential need for open and risk of bowel injury as well. He agreed to proceed.  Description of procedure: The patient was met in the holding area. The procedure was reviewed. He was then taken back to the operating room. He was placed supine. Both arms were tucked. After induction of general esthesia the abdomen was prepped and draped sterile fashion timeout performed. He was already on antibiotics. He had a previous midline laparotomy scar. I opted to place a right upper quadrant 5 mm Optiview port under direct vision without difficulty. All abdominal wall layers were visualized as the scope was passed to the abdominal wall. No evidence of bowel injury with insertion of this port. Insufflation was then to 15 mmHg of CO2. There were dense midline adhesions with the right lower quadrant was quite open. I placed 2 additional 5 mm ports in the right mid and right lower quadrant under direct vision. There is no evidence of bowel injury with insertion of his ports. He had severe inflammation of his right lower quadrant. This certainly was not 1 day worth of appendicitis. We had the patient rolled to his left. Took some time we identified the appendix and it was retrocecal and it had a mid appendiceal perforation. There is no abscess. There is significant inflammation of the cecum in the area though. Carefully we took the mesoappendix down using the harmonic scalpel with care taken not to injure the bowel. This was taken down to the base the appendix. Once this was done I replaced the right upper quadrant port with a 12 mm port. A GIA 45  stapler was passed through this and fired across the base of the appendix. This was then placed into an Endo Catch bag. It was  pulled out the right upper quadrant port site. Irrigation was used to irrigate out the right lower quadrant. We used about 4 L of this. Surgicel snow was placed into the mesoappendix due to some oozing and this helped stop the bleeding quite nicely. This was inspected and there is no signs of any further bleeding. After irrigation was suctioned out I reexamined the port sites and saw no evidence of bowel injury with insertion of these or during the case. At this point time a suture passer was used to close the right upper quadrant port site with a 0 Vicryl. We then allowed the CO2 to escape and removed reports. Additional 0 Vicryl was placed in the right upper quadrant incision as well externally. 4-0 Monocryl is used to close skin incisions. Dermabond applied. All counts were found to be correct. The patient was awoke extubated taken recovery in satisfactory condition.   Past Surgical History Janeann Forehand, CNA; 10/29/2020 9:13 AM) Appendectomy Lung Surgery Bilateral. Resection of Small Bowel  Diagnostic Studies History Janeann Forehand, CNA; 10/29/2020 9:13 AM) Colonoscopy 5-10 years ago  Social History Janeann Forehand, CNA; 10/29/2020 9:13 AM) Caffeine use Tea. No alcohol use No drug use Tobacco use Never smoker.  Family History Janeann Forehand, CNA; 10/29/2020 9:13 AM) Breast Cancer Mother. Heart Disease Father.  Other Problems Janeann Forehand, CNA; 10/29/2020 9:13 AM) Melanoma     Review of Systems [facility] CNA; 10/29/2020 9:13 AM) General Not Present- Appetite Loss, Chills, Fatigue, Fever, Night Sweats, Weight Gain and Weight Loss. Skin Not Present- Change in Wart/Mole, Dryness, Hives, Jaundice, New Lesions, Non-Healing Wounds, Rash and Ulcer. HEENT Present- Wears glasses/contact lenses. Not Present- Earache, Hearing Loss,  Hoarseness, Nose Bleed, Oral Ulcers, Ringing in the Ears, Seasonal Allergies, Sinus Pain, Sore Throat, Visual Disturbances and Yellow Eyes. Respiratory Not Present- Bloody sputum, Chronic Cough, Difficulty Breathing, Snoring and Wheezing. Breast Not Present- Breast Mass, Breast Pain, Nipple Discharge and Skin Changes. Cardiovascular Not Present- Chest Pain, Difficulty Breathing Lying Down, Leg Cramps, Palpitations, Rapid Heart Rate, Shortness of Breath and Swelling of Extremities. Gastrointestinal Present- Abdominal Pain. Not Present- Bloating, Bloody Stool, Change in Bowel Habits, Chronic diarrhea, Constipation, Difficulty Swallowing, Excessive gas, Gets full quickly at meals, Hemorrhoids, Indigestion, Nausea, Rectal Pain and Vomiting. Male Genitourinary Not Present- Blood in Urine, Change in Urinary Stream, Frequency, Impotence, Nocturia, Painful Urination, Urgency and Urine Leakage. Musculoskeletal Not Present- Back Pain, Joint Pain, Joint Stiffness, Muscle Pain, Muscle Weakness and Swelling of Extremities. Neurological Not Present- Decreased Memory, Fainting, Headaches, Numbness, Seizures, Tingling, Tremor, Trouble walking and Weakness. Psychiatric Not Present- Anxiety, Bipolar, Change in Sleep Pattern, Depression, Fearful and Frequent crying. Endocrine Not Present- Cold Intolerance, Excessive Hunger, Hair Changes, Heat Intolerance, Hot flashes and New Diabetes. Hematology Not Present- Blood Thinners, Easy Bruising, Excessive bleeding, Gland problems, HIV and Persistent Infections.  Vitals Adriana Reams Alston CNA; 10/29/2020 9:18 AM) 10/29/2020 9:17 AM Weight: 192 lb Height: 68in Body Surface Area: 2.01 m Body Mass Index: 29.19 kg/m  Temp.: 98.8F  Pulse: 64 (Regular)  P.OX: 98% (Room air) BP: 120/80(Sitting, Left Arm, Standard)        Physical Exam Adin Hector MD; 10/29/2020 9:40 AM)  General Mental Status-Alert. General Appearance-Not in acute distress, Not  Sickly. Orientation-Oriented X3. Hydration-Well hydrated. Voice-Normal.  Integumentary Global Assessment Upon inspection and palpation of skin surfaces of the - Axillae: non-tender, no inflammation or ulceration, no drainage. and Distribution of scalp and body hair is normal. General Characteristics Temperature - normal warmth is noted.  Head  and Neck Head-normocephalic, atraumatic with no lesions or palpable masses. Face Global Assessment - atraumatic, no absence of expression. Neck Global Assessment - no abnormal movements, no bruit auscultated on the right, no bruit auscultated on the left, no decreased range of motion, non-tender. Trachea-midline. Thyroid Gland Characteristics - non-tender.  Eye Eyeball - Left-Extraocular movements intact, No Nystagmus - Left. Eyeball - Right-Extraocular movements intact, No Nystagmus - Right. Cornea - Left-No Hazy - Left. Cornea - Right-No Hazy - Right. Sclera/Conjunctiva - Left-No scleral icterus, No Discharge - Left. Sclera/Conjunctiva - Right-No scleral icterus, No Discharge - Right. Pupil - Left-Direct reaction to light normal. Pupil - Right-Direct reaction to light normal.  ENMT Ears Pinna - Left - no drainage observed, no generalized tenderness observed. Pinna - Right - no drainage observed, no generalized tenderness observed. Nose and Sinuses External Inspection of the Nose - no destructive lesion observed. Inspection of the nares - Left - quiet respiration. Inspection of the nares - Right - quiet respiration. Mouth and Throat Lips - Upper Lip - no fissures observed, no pallor noted. Lower Lip - no fissures observed, no pallor noted. Nasopharynx - no discharge present. Oral Cavity/Oropharynx - Tongue - no dryness observed. Oral Mucosa - no cyanosis observed. Hypopharynx - no evidence of airway distress observed.  Chest and Lung Exam Inspection Movements - Normal and Symmetrical. Accessory muscles - No use  of accessory muscles in breathing. Palpation Palpation of the chest reveals - Non-tender. Auscultation Breath sounds - Normal and Clear.  Cardiovascular Auscultation Rhythm - Regular. Murmurs & Other Heart Sounds - Auscultation of the heart reveals - No Murmurs and No Systolic Clicks.  Abdomen Inspection Inspection of the abdomen reveals - No Visible peristalsis and No Abnormal pulsations. Umbilicus - No Bleeding, No Urine drainage. Palpation/Percussion Palpation and Percussion of the abdomen reveal - Soft, Non Tender, No Rebound tenderness, No Rigidity (guarding) and No Cutaneous hyperesthesia. Note: Abdomen soft. Nontender. Not distended. Small periumbilical incisional hernia reduces. 2 cm defect. No guarding.  Male Genitourinary Sexual Maturity Tanner 5 - Adult hair pattern and Adult penile size and shape.  Peripheral Vascular Upper Extremity Inspection - Left - No Cyanotic nailbeds - Left, Not Ischemic. Inspection - Right - No Cyanotic nailbeds - Right, Not Ischemic.  Neurologic Neurologic evaluation reveals -normal attention span and ability to concentrate, able to name objects and repeat phrases. Appropriate fund of knowledge , normal sensation and normal coordination. Mental Status Affect - not angry, not paranoid. Cranial Nerves-Normal Bilaterally. Gait-Normal.  Neuropsychiatric Mental status exam performed with findings of-able to articulate well with normal speech/language, rate, volume and coherence, thought content normal with ability to perform basic computations and apply abstract reasoning and no evidence of hallucinations, delusions, obsessions or homicidal/suicidal ideation.  Musculoskeletal Global Assessment Spine, Ribs and Pelvis - no instability, subluxation or laxity. Right Upper Extremity - no instability, subluxation or laxity.  Lymphatic Head & Neck  General Head & Neck Lymphatics: Bilateral - Description - No Localized  lymphadenopathy. Axillary  General Axillary Region: Bilateral - Description - No Localized lymphadenopathy. Femoral & Inguinal  Generalized Femoral & Inguinal Lymphatics: Left - Description - No Localized lymphadenopathy. Right - Description - No Localized lymphadenopathy.    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 Don't of (neuroendocrine versus adenomatous cancer. Nonetheless would benefit from segmental resection as discussed at GI tumor Board.  No evidence of any metastatic disease at this time. No evidence  of any recurrent metastatic melanoma.  He should see medical oncology at some point. That has not happened yet as I think the patient wanted to do surgery first. We will see.  Most likely the patient will benefit from genetic testing if that has not already happened. We'll defer to the cancer center on sorting that out.  Hopefully can do a robotic resection with intracorporeal anastomosis enhance recovery protocol. Try to minimize risks of ostomy or prolonged hospital stay. I explained to do many different times to make sure he understood since he keep asking the same questions. I think he understands that now. We gave some more information about bowel prep, colectomy, etc.   PREOP COLON - ENCOUNTER FOR PREOPERATIVE EXAMINATION FOR GENERAL SURGICAL PROCEDURE (Z01.818)  Current Plans You are being scheduled for surgery- Our schedulers will call you.  You should hear from our office's scheduling department within 5 working days about the location, date, and time of surgery. We try to make accommodations for patient's preferences in scheduling surgery, but sometimes the OR schedule or the surgeon's schedule prevents [place] from making those accommodations.  If you have not heard from our office [phone]) in 5 working days, call the office and ask for your surgeon's nurse.  If you  have other questions about your diagnosis, plan, or surgery, call the office and ask for your surgeon's nurse.  Written instructions provided The anatomy & physiology of the digestive tract was discussed. The pathophysiology of the colon was discussed. Natural history risks without surgery was discussed. I feel the risks of no intervention will lead to serious problems that outweigh the operative risks; therefore, I recommended a partial colectomy to remove the pathology. Minimally invasive (Robotic/Laparoscopic) & open techniques were discussed.  Risks such as bleeding, infection, abscess, leak, reoperation, possible ostomy, hernia, heart attack, death, and other risks were discussed. I noted a good likelihood this will help address the problem. Goals of post-operative recovery were discussed as well. Need for adequate nutrition, daily bowel regimen and healthy physical activity, to optimize recovery was noted as well. We will work to minimize complications. Educational materials were available as well. Questions were answered. The patient expresses understanding & wishes to proceed with surgery.  Pt Education - [facility] Colon Bowel Prep 2018 ERAS/Miralax/Antibiotics Pt Education - Pamphlet Given - Laparoscopic Colorectal Surgery: discussed with patient and provided information. Pt Education - [facility] Colectomy post-op instructions: discussed with patient and provided information. Started Neomycin Sulfate 500 MG Oral Tablet, 2 (two) Tablet SEE NOTE, #6, 10/29/2020, No Refill. Local Order: Pharmacist Notes: TAKE TWO TABLETS AT 2 PM, 3 PM, AND 10 PM THE DAY PRIOR TO SURGERY Started metroNIDAZOLE 500 MG Oral Tablet, 2 (two) Tablet three times daily, #6, 1 day starting 10/29/2020, No Refill. Local Order: Pharmacist Notes: Pharmacy Instructions: Take 2 tablets at 2pm, 3pm, and 10pm the day prior to your colon operation.  INCISIONAL HERNIA, WITHOUT OBSTRUCTION OR GANGRENE (K43.2) Impression: Small  paraumbilical hernia. We'll try and repair at the time of surgery. May be able to extract the colon through there versus the more standard Pfannenstiel.  Current Plans [facility] Consent - Hernia Repair - Ventral/Incisional/Umbilical ([place] Petron): discussed with patient and provided information.  RIGHT INGUINAL HERNIA (K40.90) Impression: Small but definite retinal hernia. May benefit from surgical repair in the future. Would hold off on doing anything aggressive at this time. Focus on making sure his colon cancer is under control.   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 No evidence of recurrence. Impression: He has a small incisional hernia in his midline incision. We'll try and primary repair at that time. Would not do mesh repair.  No evidence of any recurrent metastatic melanoma disease. Defer to medical oncology. Used to be Dr Jana Hakim but I suspect he is going under Dr Gearldine Shown care.  Adin Hector, MD, FACS, MASCRS  Esophageal, Gastrointestinal & Colorectal Surgery Robotic and Minimally Invasive Surgery [facility] [address] [place], [place] [address] [phone] Fax [phone] Main/Paging  CONTACT INFORMATION: Weekday (9AM-5PM) concerns: Call [facility] main office at [phone] Weeknight (5PM-9AM) or Weekend/Holiday concerns: Check [email or web address] for General Surgery [facility] coverage (Please, do not use SecureChat as it is not reliable communication to operating surgeons for immediate patient care)

## 2020-10-31 ENCOUNTER — Ambulatory Visit: Payer: 59 | Admitting: Oncology

## 2020-11-07 ENCOUNTER — Other Ambulatory Visit (HOSPITAL_COMMUNITY): Payer: Self-pay

## 2020-11-12 ENCOUNTER — Ambulatory Visit: Payer: 59

## 2020-11-16 NOTE — Patient Instructions (Addendum)
DUE TO COVID-19 ONLY ONE VISITOR IS ALLOWED TO COME WITH YOU AND STAY IN THE WAITING ROOM ONLY DURING PRE OP AND PROCEDURE DAY OF SURGERY. THE 2 VISITORS  MAY VISIT WITH YOU AFTER SURGERY IN YOUR PRIVATE ROOM DURING VISITING HOURS ONLY!  YOU NEED TO HAVE A COVID 19 TEST ON__5/9_____ @__12 :00 PM_____, THIS TEST MUST BE DONE BEFORE SURGERY,  COVID TESTING SITE [place] [address], IT IS ON THE RIGHT GOING OUT [address] APPROXIMATELY  2 MINUTES PAST [employer] ON THE RIGHT. ONCE YOUR COVID TEST IS COMPLETED,  PLEASE BEGIN THE QUARANTINE INSTRUCTIONS AS OUTLINED IN YOUR HANDOUT.                Sean [name]    Your procedure is scheduled on: 11/28/20   Report to [facility] Main  Entrance   Report to admitting at   6:30 AM     Call this number if you have problems the morning of surgery [phone]   Follow all instructions for bowel prep from the Dr's office  Drink plenty of liquids the day of prep to prevent dehydration.  Drink 20 oz of liquids at 10:00 PM the night before surgery.  No solid food after midnight.  You may have clear liquids until 5:30 AM    CLEAR LIQUID DIET   Foods Allowed                                                                     Foods Excluded  Coffee and tea, regular and decaf                             liquids that you cannot  Plain Jell-O any favor except red or purple                                           see through such as: Fruit ices (not with fruit pulp)                                     milk, soups, orange juice  Iced Popsicles                                    All solid food Carbonated beverages, regular and diet                                    Cranberry, grape and apple juices Sports drinks like Gatorade Lightly seasoned clear broth or consume(fat free) Sugar, honey syrup   Have at least 10 oz of clear liquid at 5:00 am then nothing more by mouth after 5:30 AM     BRUSH  YOUR TEETH MORNING OF SURGERY AND RINSE YOUR MOUTH OUT, NO CHEWING GUM CANDY OR MINTS.     Take these medicines the morning of  surgery with A SIP OF WATER: none                                 You may not have any metal on your body including               piercings  Do not wear jewelry,  lotions, powders or deodorant              Men may shave face and neck.   Do not bring valuables to the hospital. [name].  Contacts, dentures or bridgework may not be worn into surgery.                  Sean [name] - Preparing for Surgery Before surgery, you can play an important role.  Because skin is not sterile, your skin needs to be as free of germs as possible.  You can reduce the number of germs on your skin by washing with CHG (chlorahexidine gluconate) soap before surgery.  CHG is an antiseptic cleaner which kills germs and bonds with the skin to continue killing germs even after washing. Please DO NOT use if you have an allergy to CHG or antibacterial soaps.  If your skin becomes reddened/irritated stop using the CHG and inform your nurse when you arrive at Short Stay. You may shave your face/neck.  Please follow these instructions carefully:  1.  Shower with CHG Soap the night before surgery and the  morning of Surgery.  2.  If you choose to wash your hair, wash your hair first as usual with your  normal  shampoo.  3.  After you shampoo, rinse your hair and body thoroughly to remove the  shampoo.                                        4.  Use CHG as you would any other liquid soap.  You can apply chg directly  to the skin and wash                       Gently with a scrungie or clean washcloth.  5.  Apply the CHG Soap to your body ONLY FROM THE NECK DOWN.   Do not use on face/ open                           Wound or open sores. Avoid contact with eyes, ears mouth and genitals (private parts).                       Wash face,  Genitals  (private parts) with your normal soap.             6.  Wash thoroughly, paying special attention to the area where your surgery  will be performed.  7.  Thoroughly rinse your body with warm water from the neck down.  8.  DO NOT shower/wash with your normal soap after using and rinsing off  the CHG Soap.             9.  Pat yourself dry with a clean towel.  10.  Wear clean pajamas.            11.  Place clean sheets on your bed the night of your first shower and do not  sleep with pets. Day of Surgery : Do not apply any lotions/deodorants the morning of surgery.  Please wear clean clothes to the hospital/surgery center.  FAILURE TO FOLLOW THESE INSTRUCTIONS MAY RESULT IN THE CANCELLATION OF YOUR SURGERY PATIENT SIGNATURE_________________________________  NURSE SIGNATURE__________________________________  ________________________________________________________________________   Sean [name]  An incentive spirometer is a tool that can help keep your lungs clear and active. This tool measures how well you are filling your lungs with each breath. Taking long deep breaths may help reverse or decrease the chance of developing breathing (pulmonary) problems (especially infection) following:  A long period of time when you are unable to move or be active. BEFORE THE PROCEDURE   If the spirometer includes an indicator to show your best effort, your nurse or respiratory therapist will set it to a desired goal.  If possible, sit up straight or lean slightly forward. Try not to slouch.  Hold the incentive spirometer in an upright position. INSTRUCTIONS FOR USE  1. Sit on the edge of your bed if possible, or sit up as far as you can in bed or on a chair. 2. Hold the incentive spirometer in an upright position. 3. Breathe out normally. 4. Place the mouthpiece in your mouth and seal your lips tightly around it. 5. Breathe in slowly and as deeply as possible, raising the piston  or the ball toward the top of the column. 6. Hold your breath for 3-5 seconds or for as long as possible. Allow the piston or ball to fall to the bottom of the column. 7. Remove the mouthpiece from your mouth and breathe out normally. 8. Rest for a few seconds and repeat Steps 1 through 7 at least 10 times every 1-2 hours when you are awake. Take your time and take a few normal breaths between deep breaths. 9. The spirometer may include an indicator to show your best effort. Use the indicator as a goal to work toward during each repetition. 10. After each set of 10 deep breaths, practice coughing to be sure your lungs are clear. If you have an incision (the cut made at the time of surgery), support your incision when coughing by placing a pillow or rolled up towels firmly against it. Once you are able to get out of bed, walk around indoors and cough well. You may stop using the incentive spirometer when instructed by your caregiver.  RISKS AND COMPLICATIONS  Take your time so you do not get dizzy or light-headed.  If you are in pain, you may need to take or ask for pain medication before doing incentive spirometry. It is harder to take a deep breath if you are having pain. AFTER USE  Rest and breathe slowly and easily.  It can be helpful to keep track of a log of your progress. Your caregiver can provide you with a simple table to help with this. If you are using the spirometer at home, follow these instructions: [place] IF:   You are having difficultly using the spirometer.  You have trouble using the spirometer as often as instructed.  Your pain medication is not giving enough relief while using the spirometer.  You develop fever of 100.5 F (38.1 C) or higher. SEEK IMMEDIATE MEDICAL CARE IF:   You cough up bloody sputum  that had not been present before.  You develop fever of 102 F (38.9 C) or greater.  You develop worsening pain at or near the incision site. MAKE  SURE YOU:   Understand these instructions.  Will watch your condition.  Will get help right away if you are not doing well or get worse. Document Released: 11/17/2006 Document Revised: 09/29/2011 Document Reviewed: 01/18/2007 [facility] Patient Information 2014 [place], [place].   ________________________________________________________________________

## 2020-11-18 HISTORY — PX: COLON SURGERY: SHX602

## 2020-11-19 ENCOUNTER — Encounter (HOSPITAL_COMMUNITY): Payer: Self-pay

## 2020-11-19 ENCOUNTER — Other Ambulatory Visit: Payer: Self-pay

## 2020-11-19 ENCOUNTER — Encounter (HOSPITAL_COMMUNITY)
Admission: RE | Admit: 2020-11-19 | Discharge: 2020-11-19 | Disposition: A | Discharge status: Home / Self Care | Payer: 59 | Source: Ambulatory Visit | Attending: Surgery | Admitting: Surgery

## 2020-11-19 DIAGNOSIS — Z01812 Encounter for preprocedural laboratory examination: Secondary | ICD-10-CM | POA: Diagnosis present

## 2020-11-19 HISTORY — DX: Prediabetes: R73.03

## 2020-11-19 HISTORY — DX: Unspecified osteoarthritis, unspecified site: M19.90

## 2020-11-19 LAB — CBC
HCT: 44.3 % (ref 39.0–52.0)
Hemoglobin: 14.5 g/dL (ref 13.0–17.0)
MCH: 28.8 pg (ref 26.0–34.0)
MCHC: 32.7 g/dL (ref 30.0–36.0)
MCV: 87.9 fL (ref 80.0–100.0)
Platelets: 256 10*3/uL (ref 150–400)
RBC: 5.04 MIL/uL (ref 4.22–5.81)
RDW: 12.6 % (ref 11.5–15.5)
WBC: 5.6 10*3/uL (ref 4.0–10.5)
nRBC: 0 % (ref 0.0–0.2)

## 2020-11-19 LAB — BASIC METABOLIC PANEL
Anion gap: 8 (ref 5–15)
BUN: 13 mg/dL (ref 6–20)
CO2: 25 mmol/L (ref 22–32)
Calcium: 9.3 mg/dL (ref 8.9–10.3)
Chloride: 103 mmol/L (ref 98–111)
Creatinine, Ser: 0.81 mg/dL (ref 0.61–1.24)
GFR, Estimated: >60 mL/min (ref >60)
Glucose, Bld: 100 mg/dL — ABNORMAL HIGH (ref 70–99)
Potassium: 4.3 mmol/L (ref 3.5–5.1)
Sodium: 136 mmol/L (ref 135–145)

## 2020-11-19 LAB — HEMOGLOBIN A1C
Hgb A1c MFr Bld: 5.9 % — ABNORMAL HIGH (ref 4.8–5.6)
Mean Plasma Glucose: 122.63 mg/dL

## 2020-11-19 NOTE — Consult Note (Signed)
WOC Nurse Consult Note: WOC Nurse requested for preoperative stoma site marking by Dr. Johney [place].  Discussed surgical procedure and stoma creation with patient.  Explained role of the [place] nurse team.  Answered patient questions. Patient is hopeful that an ostomy will not be necessary.  Examined patient sitting and leaning forward in order to place the marking in the patient's visual field, away from any creases or abdominal contour issues and within the rectus muscle.  It is noted that patient has an umbilical hernia, distorting the umbilicus.  Marked for colostomy in the LUQ  8cm to the left of the umbilicus and 2.9UT above the umbilicus.  Marked for ileostomy in the RUQ  8cm to the right of the umbilicus and  6.5YY above the umbilicus.  Patient's abdomen cleansed with CHG wipes at site markings, allowed to air dry prior to marking.Covered mark with thin film transparent dressing to preserve mark until date of surgery (11/28/20).   [place] Nurse team will follow up with patient after surgery for continued ostomy care and teaching should a stoma be created intraoperatively. Please reconsult if that is the case.  Thanks, Maudie Flakes, MSN, RN, [place], Arther Abbott  Pager# [phone]

## 2020-11-19 NOTE — Progress Notes (Signed)
COVID Vaccine Completed:No Date COVID Vaccine completed: COVID vaccine manufacturer: Pfizer    Moderna   [employer]   PCP - Dr. Elijah Birk Cardiologist - none  Chest x-ray - no EKG - no Stress Test - no ECHO - no Cardiac Cath - no Pacemaker/ICD device last checked:NA  Sleep Study - no CPAP -   Fasting Blood Sugar - NA Checks Blood Sugar _____ times a day  Blood Thinner Instructions:NA Aspirin Instructions: Last Dose:  Anesthesia review:   Patient denies shortness of breath, fever, cough and chest pain at PAT appointment Yes. Pt has no SOB with any activities. He works out and walks regularly  Patient verbalized understanding of instructions that were given to them at the PAT appointment. Patient was also instructed that they will need to review over the PAT instructions again at home before surgery. Yes

## 2020-11-26 ENCOUNTER — Other Ambulatory Visit (HOSPITAL_COMMUNITY)
Admission: RE | Admit: 2020-11-26 | Discharge: 2020-11-26 | Disposition: A | Discharge status: Home / Self Care | Payer: 59 | Source: Ambulatory Visit | Attending: Surgery | Admitting: Surgery

## 2020-11-26 ENCOUNTER — Other Ambulatory Visit (HOSPITAL_COMMUNITY): Payer: 59

## 2020-11-26 DIAGNOSIS — Z01812 Encounter for preprocedural laboratory examination: Secondary | ICD-10-CM | POA: Insufficient documentation

## 2020-11-26 DIAGNOSIS — Z20822 Contact with and (suspected) exposure to covid-19: Secondary | ICD-10-CM | POA: Insufficient documentation

## 2020-11-26 LAB — SARS CORONAVIRUS 2 (TAT 6-24 HRS): SARS Coronavirus 2: NEGATIVE

## 2020-11-27 ENCOUNTER — Encounter: Payer: Self-pay | Admitting: Gastroenterology

## 2020-11-27 ENCOUNTER — Ambulatory Visit (AMBULATORY_SURGERY_CENTER): Payer: 59 | Admitting: Gastroenterology

## 2020-11-27 ENCOUNTER — Encounter (HOSPITAL_COMMUNITY): Payer: Self-pay | Admitting: Surgery

## 2020-11-27 ENCOUNTER — Other Ambulatory Visit: Payer: Self-pay | Admitting: Gastroenterology

## 2020-11-27 ENCOUNTER — Other Ambulatory Visit: Payer: Self-pay

## 2020-11-27 VITALS — BP 139/84 | HR 55 | Temp 97.8°F | Resp 16 | Ht 68.0 in | Wt 185.0 lb

## 2020-11-27 DIAGNOSIS — Z85038 Personal history of other malignant neoplasm of large intestine: Secondary | ICD-10-CM

## 2020-11-27 DIAGNOSIS — D125 Benign neoplasm of sigmoid colon: Secondary | ICD-10-CM | POA: Diagnosis not present

## 2020-11-27 DIAGNOSIS — D123 Benign neoplasm of transverse colon: Secondary | ICD-10-CM | POA: Diagnosis not present

## 2020-11-27 DIAGNOSIS — Z86010 Personal history of colonic polyps: Secondary | ICD-10-CM | POA: Diagnosis not present

## 2020-11-27 DIAGNOSIS — D124 Benign neoplasm of descending colon: Secondary | ICD-10-CM | POA: Diagnosis not present

## 2020-11-27 MED ORDER — CLINDAMYCIN PHOSPHATE 900 MG/50ML IV SOLN
900.0000 mg | INTRAVENOUS | Status: AC
  Administered 2020-11-28: 900 mg via INTRAVENOUS
  Filled 2020-11-27: qty 50

## 2020-11-27 MED ORDER — BUPIVACAINE LIPOSOME 1.3 % IJ SUSP
20.0000 mL | Freq: Once | INTRAMUSCULAR | Status: DC
  Filled 2020-11-27: qty 20

## 2020-11-27 MED ORDER — GENTAMICIN SULFATE 40 MG/ML IJ SOLN
5.0000 mg/kg | INTRAVENOUS | Status: AC
  Administered 2020-11-28: 380 mg via INTRAVENOUS
  Filled 2020-11-27: qty 9.5

## 2020-11-27 MED ORDER — SODIUM CHLORIDE 0.9 % IV SOLN: 500.0000 mL | Freq: Once | INTRAVENOUS | Status: DC

## 2020-11-27 NOTE — Progress Notes (Signed)
PT taken to PACU. Monitors in place. VSS. Report given to RN. 

## 2020-11-27 NOTE — Patient Instructions (Signed)
YOU HAD AN ENDOSCOPIC PROCEDURE TODAY AT [facility]:   Refer to the procedure report that was given to you for any specific questions about what was found during the examination.  If the procedure report does not answer your questions, please call your gastroenterologist to clarify.  If you requested that your care partner not be given the details of your procedure findings, then the procedure report has been included in a sealed envelope for you to review at your convenience later.  YOU SHOULD EXPECT: Some feelings of bloating in the abdomen. Passage of more gas than usual.  Walking can help get rid of the air that was put into your GI tract during the procedure and reduce the bloating. If you had a lower endoscopy (such as a colonoscopy or flexible sigmoidoscopy) you may notice spotting of blood in your stool or on the toilet paper. If you underwent a bowel prep for your procedure, you may not have a normal bowel movement for a few days.  Please Note:  You might notice some irritation and congestion in your nose or some drainage.  This is from the oxygen used during your procedure.  There is no need for concern and it should clear up in a day or so.  SYMPTOMS TO REPORT IMMEDIATELY:   Following lower endoscopy (colonoscopy or flexible sigmoidoscopy):  Excessive amounts of blood in the stool  Significant tenderness or worsening of abdominal pains  Swelling of the abdomen that is new, acute  Fever of 100F or higher   For urgent or emergent issues, a gastroenterologist can be reached at any hour by calling [phone]. Do not use MyChart messaging for urgent concerns.    DIET:  Continue Clear Liquid Diet for right colectomy scheduled at the hospital tomorrow.  Drink plenty of fluids but you should avoid alcoholic  beverages for 24 hours.  MEDICATIONS: Continue present medications.  Please see handouts given to you by your recovery nurse.  Thank you for allowing [place] to  provide for your healthcare needs today.  ACTIVITY:  You should plan to take it easy for the rest of today and you should NOT DRIVE or use heavy machinery until tomorrow (because of the sedation medicines used during the test).    FOLLOW UP: Our staff will call the number listed on your records 48-72 hours following your procedure to check on you and address any questions or concerns that you may have regarding the information given to you following your procedure. If we do not reach you, we will leave a message.  We will attempt to reach you two times.  During this call, we will ask if you have developed any symptoms of COVID 19. If you develop any symptoms (ie: fever, flu-like symptoms, shortness of breath, cough etc.) before then, please call [phone].  If you test positive for Covid 19 in the 2 weeks post procedure, please call and report this information to [place].    If any biopsies were taken you will be contacted by phone or by letter within the next 1-3 weeks.  Please call us at [phone] if you have not heard about the biopsies in 3 weeks.    SIGNATURES/CONFIDENTIALITY: You and/or your care partner have signed paperwork which will be entered into your electronic medical record.  These signatures attest to the fact that that the information above on your After Visit Summary has been reviewed and is understood.  Full responsibility of the confidentiality of this discharge  information lies with you and/or your care-partner. 

## 2020-11-27 NOTE — Progress Notes (Signed)
Pt's states no medical or surgical changes since previsit or office visit.  Check-in-  AER  Vital Signs-SM

## 2020-11-27 NOTE — Progress Notes (Signed)
Called to room to assist during endoscopic procedure.  Patient ID and intended procedure confirmed with present staff. Received instructions for my participation in the procedure from the performing physician.  

## 2020-11-27 NOTE — Op Note (Signed)
[facility] Patient Name: Sean [name] Procedure Date: 11/27/2020 2:27 PM MRN: [id number] Endoscopist: Milus Banister , MD Age: 60 Referring MD:  Date of Birth: 06-18-1961 Gender: Male Account #: [id number] Procedure:                Colonoscopy Indications:              High risk colon cancer surveillance: Personal                            history of colonic polyps; 2018 Colonoscopy with                            Dr. 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                            Johney [place] Medicines:                Monitored Anesthesia Care Procedure:                Pre-Anesthesia Assessment:                           - Prior to the procedure, a History and Physical                            was performed, and patient medications and                            allergies were reviewed. The patient's tolerance of                            previous anesthesia was also reviewed. The risks                            and benefits of the procedure and the sedation                            options and risks were discussed with the patient.                            All questions were answered, and informed consent                            was obtained. Prior Anticoagulants: The patient has                            taken no previous anticoagulant or antiplatelet  agents. ASA Grade Assessment: II - A patient with                            mild systemic disease. After reviewing the risks                            and benefits, the patient was deemed in                            satisfactory condition to undergo the procedure.                           After obtaining informed consent, the colonoscope                            was passed under direct vision. Throughout the                             procedure, the patient's blood pressure, pulse, and                            oxygen saturations were monitored continuously. The                            Olympus CF-HQ190L [id number]) Colonoscope was                            introduced through the anus and advanced to the the                            cecum, identified by appendiceal orifice and                            ileocecal valve. The colonoscopy was performed                            without difficulty. The patient tolerated the                            procedure well. The quality of the bowel                            preparation was good. The ileocecal valve,                            appendiceal orifice, and rectum were photographed. Scope In: 2:33:14 PM Scope Out: 2:51:45 PM Scope Withdrawal Time: 0 hours 15 minutes 45 seconds  Total Procedure Duration: 0 hours 18 minutes 31 seconds  Findings:                 Normal appendiceal orifice.                           Eight sessile polyps were found in the sigmoid  colon, descending colon and transverse colon. The                            polyps were 3 to 7 mm in size. These polyps were                            removed with a cold snare. Resection and retrieval                            were complete.                           Multiple small and large-mouthed diverticula were                            found in the left colon.                           The exam was otherwise without abnormality on                            direct and retroflexion views. Complications:            No immediate complications. Estimated blood loss:                            None. Estimated Blood Loss:     Estimated blood loss: none. Impression:               - Eight 3 to 7 mm polyps in the sigmoid colon, in                            the descending colon and in the transverse colon,                            removed with a cold snare. Resected and  retrieved.                           - Diverticulosis in the left colon.                           - The examination was otherwise normal on direct                            and retroflexion views. Recommendation:           - Patient has a contact number available for                            emergencies. The signs and symptoms of potential                            delayed complications were discussed with the  patient. Return to normal activities tomorrow.                            Written discharge instructions were provided to the                            patient.                           - Resume clear liquid diet for right colectomy                            tomorrow.                           - Continue present medications.                           - Await pathology results. Milus Banister, MD 11/27/2020 2:56:00 PM This report has been signed electronically.

## 2020-11-28 ENCOUNTER — Inpatient Hospital Stay (HOSPITAL_COMMUNITY): Payer: 59 | Admitting: Anesthesiology

## 2020-11-28 ENCOUNTER — Ambulatory Visit: Payer: 59 | Admitting: Oncology

## 2020-11-28 ENCOUNTER — Encounter (HOSPITAL_COMMUNITY)
Admission: RE | Disposition: A | Discharge status: Home / Self Care | Payer: Self-pay | Source: Home / Self Care | Attending: Surgery

## 2020-11-28 ENCOUNTER — Inpatient Hospital Stay (HOSPITAL_COMMUNITY)
Admission: RE | Admit: 2020-11-28 | Discharge: 2020-12-03 | DRG: 330 | Disposition: A | Discharge status: Home / Self Care | Payer: 59 | Attending: Surgery | Admitting: Surgery

## 2020-11-28 ENCOUNTER — Other Ambulatory Visit: Payer: Self-pay

## 2020-11-28 ENCOUNTER — Encounter (HOSPITAL_COMMUNITY): Payer: Self-pay | Admitting: Surgery

## 2020-11-28 DIAGNOSIS — Z20822 Contact with and (suspected) exposure to covid-19: Secondary | ICD-10-CM | POA: Diagnosis present

## 2020-11-28 DIAGNOSIS — K660 Peritoneal adhesions (postprocedural) (postinfection): Secondary | ICD-10-CM | POA: Diagnosis present

## 2020-11-28 DIAGNOSIS — C180 Malignant neoplasm of cecum: Secondary | ICD-10-CM

## 2020-11-28 DIAGNOSIS — K430 Incisional hernia with obstruction, without gangrene: Secondary | ICD-10-CM | POA: Diagnosis present

## 2020-11-28 DIAGNOSIS — C439 Malignant melanoma of skin, unspecified: Secondary | ICD-10-CM | POA: Diagnosis present

## 2020-11-28 DIAGNOSIS — C19 Malignant neoplasm of rectosigmoid junction: Secondary | ICD-10-CM | POA: Diagnosis present

## 2020-11-28 DIAGNOSIS — Z9049 Acquired absence of other specified parts of digestive tract: Secondary | ICD-10-CM | POA: Diagnosis not present

## 2020-11-28 DIAGNOSIS — C7931 Secondary malignant neoplasm of brain: Secondary | ICD-10-CM | POA: Diagnosis present

## 2020-11-28 DIAGNOSIS — E785 Hyperlipidemia, unspecified: Secondary | ICD-10-CM | POA: Diagnosis present

## 2020-11-28 DIAGNOSIS — Z85820 Personal history of malignant melanoma of skin: Secondary | ICD-10-CM

## 2020-11-28 DIAGNOSIS — K429 Umbilical hernia without obstruction or gangrene: Secondary | ICD-10-CM | POA: Diagnosis present

## 2020-11-28 DIAGNOSIS — C181 Malignant neoplasm of appendix: Principal | ICD-10-CM | POA: Diagnosis present

## 2020-11-28 DIAGNOSIS — K567 Ileus, unspecified: Secondary | ICD-10-CM | POA: Diagnosis not present

## 2020-11-28 DIAGNOSIS — H903 Sensorineural hearing loss, bilateral: Secondary | ICD-10-CM | POA: Diagnosis present

## 2020-11-28 DIAGNOSIS — M545 Low back pain, unspecified: Secondary | ICD-10-CM | POA: Insufficient documentation

## 2020-11-28 DIAGNOSIS — R7303 Prediabetes: Secondary | ICD-10-CM | POA: Diagnosis present

## 2020-11-28 DIAGNOSIS — Z9221 Personal history of antineoplastic chemotherapy: Secondary | ICD-10-CM

## 2020-11-28 DIAGNOSIS — M25579 Pain in unspecified ankle and joints of unspecified foot: Secondary | ICD-10-CM | POA: Insufficient documentation

## 2020-11-28 DIAGNOSIS — C799 Secondary malignant neoplasm of unspecified site: Secondary | ICD-10-CM | POA: Diagnosis present

## 2020-11-28 LAB — TYPE AND SCREEN
ABO/RH(D): A POS
Antibody Screen: NEGATIVE

## 2020-11-28 LAB — ABO/RH: ABO/RH(D): A POS

## 2020-11-28 SURGERY — COLECTOMY, SIGMOID, ROBOT-ASSISTED
Anesthesia: General | Site: Abdomen

## 2020-11-28 MED ORDER — MIDAZOLAM HCL 2 MG/2ML IJ SOLN
INTRAMUSCULAR | Status: AC
  Filled 2020-11-28: qty 2

## 2020-11-28 MED ORDER — GLYCOPYRROLATE PF 0.2 MG/ML IJ SOSY
PREFILLED_SYRINGE | INTRAMUSCULAR | Status: DC | PRN
  Administered 2020-11-28: .2 mg via INTRAVENOUS

## 2020-11-28 MED ORDER — BUPIVACAINE-EPINEPHRINE (PF) 0.25% -1:200000 IJ SOLN
INTRAMUSCULAR | Status: DC | PRN
  Administered 2020-11-28: 60 mL

## 2020-11-28 MED ORDER — BUPIVACAINE LIPOSOME 1.3 % IJ SUSP
INTRAMUSCULAR | Status: DC | PRN
  Administered 2020-11-28: 20 mL

## 2020-11-28 MED ORDER — 0.9 % SODIUM CHLORIDE (POUR BTL) OPTIME
TOPICAL | Status: DC | PRN
  Administered 2020-11-28: 2000 mL

## 2020-11-28 MED ORDER — METOPROLOL TARTRATE 5 MG/5ML IV SOLN: 5.0000 mg | Freq: Four times a day (QID) | INTRAVENOUS | Status: DC | PRN

## 2020-11-28 MED ORDER — MORPHINE SULFATE (PF) 2 MG/ML IV SOLN
2.0000 mg | INTRAVENOUS | Status: DC | PRN
  Administered 2020-11-28: 2 mg via INTRAVENOUS
  Filled 2020-11-28: qty 1

## 2020-11-28 MED ORDER — PHENYLEPHRINE HCL-NACL 10-0.9 MG/250ML-% IV SOLN
INTRAVENOUS | Status: DC | PRN
  Administered 2020-11-28: 60 ug/min via INTRAVENOUS

## 2020-11-28 MED ORDER — ACETAMINOPHEN 10 MG/ML IV SOLN: 1000.0000 mg | Freq: Once | INTRAVENOUS | Status: DC | PRN

## 2020-11-28 MED ORDER — DEXAMETHASONE SODIUM PHOSPHATE 10 MG/ML IJ SOLN
INTRAMUSCULAR | Status: AC
  Filled 2020-11-28: qty 2

## 2020-11-28 MED ORDER — PROMETHAZINE HCL 25 MG/ML IJ SOLN
6.2500 mg | INTRAMUSCULAR | Status: DC | PRN
Start: 2020-11-28 — End: 2020-11-28

## 2020-11-28 MED ORDER — LACTATED RINGERS IV SOLN: INTRAVENOUS | Status: DC

## 2020-11-28 MED ORDER — CALCIUM POLYCARBOPHIL 625 MG PO TABS
625.0000 mg | ORAL_TABLET | Freq: Two times a day (BID) | ORAL | Status: DC
  Administered 2020-11-28 – 2020-12-02 (×8): 625 mg via ORAL
  Filled 2020-11-28 (×10): qty 1

## 2020-11-28 MED ORDER — ACETAMINOPHEN 325 MG PO TABS: 325.0000 mg | ORAL_TABLET | ORAL | Status: DC | PRN

## 2020-11-28 MED ORDER — EPHEDRINE SULFATE-NACL 50-0.9 MG/10ML-% IV SOSY
PREFILLED_SYRINGE | INTRAVENOUS | Status: DC | PRN
  Administered 2020-11-28: 10 mg via INTRAVENOUS

## 2020-11-28 MED ORDER — ZOLPIDEM TARTRATE 10 MG PO TABS: 10.0000 mg | ORAL_TABLET | Freq: Every evening | ORAL | Status: DC | PRN

## 2020-11-28 MED ORDER — LIDOCAINE HCL 2 % IJ SOLN
INTRAMUSCULAR | Status: AC
  Filled 2020-11-28: qty 20

## 2020-11-28 MED ORDER — PROCHLORPERAZINE MALEATE 10 MG PO TABS
10.0000 mg | ORAL_TABLET | Freq: Four times a day (QID) | ORAL | Status: DC | PRN
  Filled 2020-11-28: qty 1

## 2020-11-28 MED ORDER — PROPOFOL 10 MG/ML IV BOLUS
INTRAVENOUS | Status: AC
  Filled 2020-11-28: qty 40

## 2020-11-28 MED ORDER — GLYCOPYRROLATE PF 0.2 MG/ML IJ SOSY
PREFILLED_SYRINGE | INTRAMUSCULAR | Status: AC
  Filled 2020-11-28: qty 1

## 2020-11-28 MED ORDER — ENOXAPARIN SODIUM 40 MG/0.4ML IJ SOSY
40.0000 mg | PREFILLED_SYRINGE | Freq: Once | INTRAMUSCULAR | Status: AC
  Administered 2020-11-28: 40 mg via SUBCUTANEOUS
  Filled 2020-11-28: qty 0.4

## 2020-11-28 MED ORDER — ACETAMINOPHEN 500 MG PO TABS
1000.0000 mg | ORAL_TABLET | ORAL | Status: AC
  Administered 2020-11-28: 1000 mg via ORAL
  Filled 2020-11-28: qty 2

## 2020-11-28 MED ORDER — GABAPENTIN 300 MG PO CAPS
300.0000 mg | ORAL_CAPSULE | ORAL | Status: AC
  Administered 2020-11-28: 300 mg via ORAL

## 2020-11-28 MED ORDER — DEXAMETHASONE SODIUM PHOSPHATE 10 MG/ML IJ SOLN
INTRAMUSCULAR | Status: DC | PRN
  Administered 2020-11-28: 10 mg via INTRAVENOUS

## 2020-11-28 MED ORDER — LIDOCAINE 2% (20 MG/ML) 5 ML SYRINGE
INTRAMUSCULAR | Status: AC
  Filled 2020-11-28: qty 10

## 2020-11-28 MED ORDER — CHLORHEXIDINE GLUCONATE 0.12 % MT SOLN
15.0000 mL | Freq: Once | OROMUCOSAL | Status: AC
  Administered 2020-11-28: 15 mL via OROMUCOSAL

## 2020-11-28 MED ORDER — ACETAMINOPHEN 500 MG PO TABS
1000.0000 mg | ORAL_TABLET | Freq: Four times a day (QID) | ORAL | Status: DC
  Administered 2020-11-28 – 2020-12-03 (×12): 1000 mg via ORAL
  Filled 2020-11-28 (×15): qty 2

## 2020-11-28 MED ORDER — ONDANSETRON HCL 4 MG/2ML IJ SOLN
INTRAMUSCULAR | Status: AC
  Filled 2020-11-28: qty 4

## 2020-11-28 MED ORDER — ENSURE PRE-SURGERY PO LIQD: 592.0000 mL | Freq: Once | ORAL | Status: DC

## 2020-11-28 MED ORDER — LACTATED RINGERS IR SOLN
Status: DC | PRN
  Administered 2020-11-28: 1000 mL

## 2020-11-28 MED ORDER — POLYETHYLENE GLYCOL 3350 17 GM/SCOOP PO POWD: 1.0000 | Freq: Once | ORAL | Status: DC

## 2020-11-28 MED ORDER — OXYCODONE HCL 5 MG PO TABS: 5.0000 mg | ORAL_TABLET | Freq: Once | ORAL | Status: DC | PRN

## 2020-11-28 MED ORDER — ACETAMINOPHEN 160 MG/5ML PO SOLN
325.0000 mg | ORAL | Status: DC | PRN
Start: 2020-11-28 — End: 2020-11-28

## 2020-11-28 MED ORDER — ENSURE SURGERY PO LIQD
237.0000 mL | Freq: Two times a day (BID) | ORAL | Status: DC
  Administered 2020-11-28 – 2020-11-29 (×3): 237 mL via ORAL

## 2020-11-28 MED ORDER — MELATONIN 3 MG PO TABS
3.0000 mg | ORAL_TABLET | Freq: Every evening | ORAL | Status: DC | PRN
  Administered 2020-12-01 – 2020-12-02 (×2): 3 mg via ORAL
  Filled 2020-11-28 (×2): qty 1

## 2020-11-28 MED ORDER — ALVIMOPAN 12 MG PO CAPS
12.0000 mg | ORAL_CAPSULE | ORAL | Status: AC
  Administered 2020-11-28: 12 mg via ORAL

## 2020-11-28 MED ORDER — MAGIC MOUTHWASH
15.0000 mL | Freq: Four times a day (QID) | ORAL | Status: DC | PRN
  Filled 2020-11-28: qty 15

## 2020-11-28 MED ORDER — NEOMYCIN SULFATE 500 MG PO TABS: 1000.0000 mg | ORAL_TABLET | ORAL | Status: DC

## 2020-11-28 MED ORDER — AMISULPRIDE (ANTIEMETIC) 5 MG/2ML IV SOLN: 10.0000 mg | Freq: Once | INTRAVENOUS | Status: DC | PRN

## 2020-11-28 MED ORDER — PROPOFOL 10 MG/ML IV BOLUS
INTRAVENOUS | Status: DC | PRN
  Administered 2020-11-28: 140 mg via INTRAVENOUS

## 2020-11-28 MED ORDER — SIMETHICONE 80 MG PO CHEW
40.0000 mg | CHEWABLE_TABLET | Freq: Four times a day (QID) | ORAL | Status: DC | PRN
  Administered 2020-12-01 – 2020-12-02 (×2): 40 mg via ORAL
  Filled 2020-11-28: qty 1

## 2020-11-28 MED ORDER — ROCURONIUM BROMIDE 10 MG/ML (PF) SYRINGE
PREFILLED_SYRINGE | INTRAVENOUS | Status: AC
  Filled 2020-11-28: qty 20

## 2020-11-28 MED ORDER — FENTANYL CITRATE (PF) 100 MCG/2ML IJ SOLN: 25.0000 ug | INTRAMUSCULAR | Status: DC | PRN

## 2020-11-28 MED ORDER — ONDANSETRON HCL 4 MG PO TABS
4.0000 mg | ORAL_TABLET | Freq: Four times a day (QID) | ORAL | Status: DC | PRN
  Administered 2020-11-30: 4 mg via ORAL
  Filled 2020-11-28: qty 1

## 2020-11-28 MED ORDER — KETAMINE HCL 10 MG/ML IJ SOLN
INTRAMUSCULAR | Status: DC | PRN
  Administered 2020-11-28: 10 mg via INTRAVENOUS
  Administered 2020-11-28 (×2): 20 mg via INTRAVENOUS

## 2020-11-28 MED ORDER — LACTATED RINGERS IV SOLN: INTRAVENOUS | Status: DC | PRN

## 2020-11-28 MED ORDER — METHOCARBAMOL 500 MG PO TABS
1000.0000 mg | ORAL_TABLET | Freq: Four times a day (QID) | ORAL | Status: DC | PRN
  Administered 2020-11-30 – 2020-12-02 (×3): 1000 mg via ORAL
  Filled 2020-11-28 (×3): qty 2

## 2020-11-28 MED ORDER — LIDOCAINE 2% (20 MG/ML) 5 ML SYRINGE
INTRAMUSCULAR | Status: DC | PRN
  Administered 2020-11-28: 80 mg via INTRAVENOUS
  Administered 2020-11-28: 1 mg/kg/h via INTRAVENOUS

## 2020-11-28 MED ORDER — LIP MEDEX EX OINT
1.0000 "application " | TOPICAL_OINTMENT | Freq: Two times a day (BID) | CUTANEOUS | Status: DC
  Administered 2020-11-28 – 2020-12-02 (×9): 1 via TOPICAL
  Filled 2020-11-28 (×2): qty 7

## 2020-11-28 MED ORDER — PHENYLEPHRINE HCL (PRESSORS) 10 MG/ML IV SOLN
INTRAVENOUS | Status: AC
  Filled 2020-11-28: qty 1

## 2020-11-28 MED ORDER — MIDAZOLAM HCL 5 MG/5ML IJ SOLN
INTRAMUSCULAR | Status: DC | PRN
  Administered 2020-11-28: 2 mg via INTRAVENOUS

## 2020-11-28 MED ORDER — ONDANSETRON HCL 4 MG/2ML IJ SOLN
4.0000 mg | Freq: Four times a day (QID) | INTRAMUSCULAR | Status: DC | PRN
  Administered 2020-11-30: 4 mg via INTRAVENOUS
  Filled 2020-11-28 (×2): qty 2

## 2020-11-28 MED ORDER — FENTANYL CITRATE (PF) 100 MCG/2ML IJ SOLN
INTRAMUSCULAR | Status: DC | PRN
  Administered 2020-11-28 (×2): 50 ug via INTRAVENOUS
  Administered 2020-11-28: 100 ug via INTRAVENOUS
  Administered 2020-11-28 (×3): 50 ug via INTRAVENOUS

## 2020-11-28 MED ORDER — FENTANYL CITRATE (PF) 100 MCG/2ML IJ SOLN
INTRAMUSCULAR | Status: AC
  Filled 2020-11-28: qty 2

## 2020-11-28 MED ORDER — CLINDAMYCIN PHOSPHATE 900 MG/50ML IV SOLN
900.0000 mg | Freq: Three times a day (TID) | INTRAVENOUS | Status: AC
  Administered 2020-11-28: 900 mg via INTRAVENOUS
  Filled 2020-11-28: qty 50

## 2020-11-28 MED ORDER — DIPHENHYDRAMINE HCL 50 MG/ML IJ SOLN: 12.5000 mg | Freq: Four times a day (QID) | INTRAMUSCULAR | Status: DC | PRN

## 2020-11-28 MED ORDER — SUGAMMADEX SODIUM 200 MG/2ML IV SOLN
INTRAVENOUS | Status: DC | PRN
  Administered 2020-11-28: 200 mg via INTRAVENOUS

## 2020-11-28 MED ORDER — METHOCARBAMOL 1000 MG/10ML IJ SOLN
1000.0000 mg | Freq: Four times a day (QID) | INTRAVENOUS | Status: DC | PRN
  Filled 2020-11-28 (×2): qty 10

## 2020-11-28 MED ORDER — CELECOXIB 200 MG PO CAPS
200.0000 mg | ORAL_CAPSULE | ORAL | Status: AC
  Administered 2020-11-28: 200 mg via ORAL
  Filled 2020-11-28: qty 1

## 2020-11-28 MED ORDER — PROCHLORPERAZINE EDISYLATE 10 MG/2ML IJ SOLN: 5.0000 mg | Freq: Four times a day (QID) | INTRAMUSCULAR | Status: DC | PRN

## 2020-11-28 MED ORDER — BUPIVACAINE-EPINEPHRINE (PF) 0.25% -1:200000 IJ SOLN
INTRAMUSCULAR | Status: AC
  Filled 2020-11-28: qty 60

## 2020-11-28 MED ORDER — ROCURONIUM BROMIDE 10 MG/ML (PF) SYRINGE
PREFILLED_SYRINGE | INTRAVENOUS | Status: DC | PRN
  Administered 2020-11-28: 50 mg via INTRAVENOUS
  Administered 2020-11-28: 20 mg via INTRAVENOUS
  Administered 2020-11-28: 30 mg via INTRAVENOUS
  Administered 2020-11-28: 20 mg via INTRAVENOUS
  Administered 2020-11-28: 10 mg via INTRAVENOUS
  Administered 2020-11-28: 20 mg via INTRAVENOUS
  Administered 2020-11-28: 10 mg via INTRAVENOUS

## 2020-11-28 MED ORDER — ALUM & MAG HYDROXIDE-SIMETH 200-200-20 MG/5ML PO SUSP: 30.0000 mL | Freq: Four times a day (QID) | ORAL | Status: DC | PRN

## 2020-11-28 MED ORDER — ENOXAPARIN SODIUM 40 MG/0.4ML IJ SOSY
40.0000 mg | PREFILLED_SYRINGE | INTRAMUSCULAR | Status: DC
  Filled 2020-11-28 (×5): qty 0.4

## 2020-11-28 MED ORDER — KETAMINE HCL 10 MG/ML IJ SOLN
INTRAMUSCULAR | Status: AC
  Filled 2020-11-28: qty 1

## 2020-11-28 MED ORDER — ORAL CARE MOUTH RINSE: 15.0000 mL | Freq: Once | OROMUCOSAL | Status: AC

## 2020-11-28 MED ORDER — VITAMIN D 25 MCG (1000 UNIT) PO TABS
2000.0000 [IU] | ORAL_TABLET | Freq: Every day | ORAL | Status: DC
  Administered 2020-11-29 – 2020-12-02 (×4): 2000 [IU] via ORAL
  Filled 2020-11-28 (×6): qty 2

## 2020-11-28 MED ORDER — METRONIDAZOLE 500 MG PO TABS: 1000.0000 mg | ORAL_TABLET | ORAL | Status: DC

## 2020-11-28 MED ORDER — ENSURE PRE-SURGERY PO LIQD
296.0000 mL | Freq: Once | ORAL | Status: DC
  Filled 2020-11-28: qty 296

## 2020-11-28 MED ORDER — TRAMADOL HCL 50 MG PO TABS
50.0000 mg | ORAL_TABLET | Freq: Four times a day (QID) | ORAL | Status: DC | PRN
Start: 2020-11-28 — End: 2020-12-03
  Administered 2020-11-28 – 2020-11-30 (×3): 100 mg via ORAL
  Filled 2020-11-28 (×4): qty 2

## 2020-11-28 MED ORDER — EPHEDRINE 5 MG/ML INJ
INTRAVENOUS | Status: AC
  Filled 2020-11-28: qty 10

## 2020-11-28 MED ORDER — OXYCODONE HCL 5 MG/5ML PO SOLN
5.0000 mg | Freq: Once | ORAL | Status: DC | PRN
Start: 2020-11-28 — End: 2020-11-28

## 2020-11-28 MED ORDER — FENTANYL CITRATE (PF) 250 MCG/5ML IJ SOLN
INTRAMUSCULAR | Status: AC
  Filled 2020-11-28: qty 5

## 2020-11-28 MED ORDER — DIPHENHYDRAMINE HCL 12.5 MG/5ML PO ELIX: 12.5000 mg | ORAL_SOLUTION | Freq: Four times a day (QID) | ORAL | Status: DC | PRN

## 2020-11-28 MED ORDER — ROCURONIUM BROMIDE 10 MG/ML (PF) SYRINGE
PREFILLED_SYRINGE | INTRAVENOUS | Status: AC
  Filled 2020-11-28: qty 10

## 2020-11-28 MED ORDER — BISACODYL 5 MG PO TBEC: 20.0000 mg | DELAYED_RELEASE_TABLET | Freq: Once | ORAL | Status: DC

## 2020-11-28 MED ORDER — HYDROMORPHONE HCL 1 MG/ML IJ SOLN
0.5000 mg | INTRAMUSCULAR | Status: DC | PRN
  Filled 2020-11-28: qty 1

## 2020-11-28 MED ORDER — ALVIMOPAN 12 MG PO CAPS
12.0000 mg | ORAL_CAPSULE | Freq: Two times a day (BID) | ORAL | Status: DC
  Administered 2020-11-29 – 2020-11-30 (×3): 12 mg via ORAL
  Filled 2020-11-28 (×6): qty 1

## 2020-11-28 MED ORDER — ONDANSETRON HCL 4 MG/2ML IJ SOLN
INTRAMUSCULAR | Status: DC | PRN
  Administered 2020-11-28: 4 mg via INTRAVENOUS

## 2020-11-28 MED ORDER — SODIUM CHLORIDE 0.9 % IV SOLN: Freq: Three times a day (TID) | INTRAVENOUS | Status: DC | PRN

## 2020-11-28 SURGICAL SUPPLY — 117 items
APL PRP STRL LF DISP 70% ISPRP (MISCELLANEOUS)
APPLIER CLIP 5 13 M/L LIGAMAX5 (MISCELLANEOUS)
APPLIER CLIP ROT 10 11.4 M/L (STAPLE)
APR CLP MED LRG 11.4X10 (STAPLE)
APR CLP MED LRG 5 ANG JAW (MISCELLANEOUS)
BLADE EXTENDED COATED 6.5IN (ELECTRODE) IMPLANT
CANNULA REDUC XI 12-8 STAPL (CANNULA)
CANNULA REDUCER 12-8 DVNC XI (CANNULA) IMPLANT
CELLS DAT CNTRL 66122 CELL SVR (MISCELLANEOUS) IMPLANT
CHLORAPREP W/TINT 26 (MISCELLANEOUS) IMPLANT
CLIP APPLIE 5 13 M/L LIGAMAX5 (MISCELLANEOUS) IMPLANT
CLIP APPLIE ROT 10 11.4 M/L (STAPLE) IMPLANT
COVER SURGICAL LIGHT HANDLE (MISCELLANEOUS) ×4 IMPLANT
COVER TIP SHEARS 8 DVNC (MISCELLANEOUS) ×1 IMPLANT
COVER TIP SHEARS 8MM DA VINCI (MISCELLANEOUS) ×2
COVER WAND RF STERILE (DRAPES) ×2 IMPLANT
DECANTER SPIKE VIAL GLASS SM (MISCELLANEOUS) ×2 IMPLANT
DEVICE TROCAR PUNCTURE CLOSURE (ENDOMECHANICALS) IMPLANT
DRAIN CHANNEL 19F RND (DRAIN) IMPLANT
DRAPE ARM DVNC X/XI (DISPOSABLE) ×4 IMPLANT
DRAPE COLUMN DVNC XI (DISPOSABLE) ×1 IMPLANT
DRAPE DA VINCI XI ARM (DISPOSABLE) ×8
DRAPE DA VINCI XI COLUMN (DISPOSABLE) ×2
DRAPE SURG IRRIG POUCH 19X23 (DRAPES) ×2 IMPLANT
DRSG OPSITE POSTOP 4X10 (GAUZE/BANDAGES/DRESSINGS) IMPLANT
DRSG OPSITE POSTOP 4X6 (GAUZE/BANDAGES/DRESSINGS) ×1 IMPLANT
DRSG OPSITE POSTOP 4X8 (GAUZE/BANDAGES/DRESSINGS) IMPLANT
DRSG TEGADERM 2-3/8X2-3/4 SM (GAUZE/BANDAGES/DRESSINGS) ×6 IMPLANT
DRSG TEGADERM 4X4.75 (GAUZE/BANDAGES/DRESSINGS) IMPLANT
ELECT PENCIL ROCKER SW 15FT (MISCELLANEOUS) ×2 IMPLANT
ELECT REM PT RETURN 15FT ADLT (MISCELLANEOUS) ×2 IMPLANT
ENDOLOOP SUT PDS II  0 18 (SUTURE)
ENDOLOOP SUT PDS II 0 18 (SUTURE) IMPLANT
EVACUATOR SILICONE 100CC (DRAIN) IMPLANT
GAUZE SPONGE 2X2 8PLY STRL LF (GAUZE/BANDAGES/DRESSINGS) ×1 IMPLANT
GLOVE SURG LTX SZ8 (GLOVE) ×6 IMPLANT
GLOVE SURG UNDER LTX SZ8 (GLOVE) ×6 IMPLANT
GOWN STRL REUS W/TWL XL LVL3 (GOWN DISPOSABLE) ×6 IMPLANT
GRASPER SUT TROCAR 14GX15 (MISCELLANEOUS) IMPLANT
HOLDER FOLEY CATH W/STRAP (MISCELLANEOUS) ×2 IMPLANT
IRRIG SUCT STRYKERFLOW 2 WTIP (MISCELLANEOUS) ×2
IRRIGATION SUCT STRKRFLW 2 WTP (MISCELLANEOUS) ×1 IMPLANT
KIT PROCEDURE DA VINCI SI (MISCELLANEOUS)
KIT PROCEDURE DVNC SI (MISCELLANEOUS) IMPLANT
KIT SIGMOIDOSCOPE (SET/KITS/TRAYS/PACK) IMPLANT
KIT TURNOVER KIT A (KITS) ×2 IMPLANT
NDL INSUFFLATION 14GA 120MM (NEEDLE) ×1 IMPLANT
NEEDLE INSUFFLATION 14GA 120MM (NEEDLE) ×2 IMPLANT
PACK CARDIOVASCULAR III (CUSTOM PROCEDURE TRAY) ×2 IMPLANT
PACK COLON (CUSTOM PROCEDURE TRAY) ×2 IMPLANT
PAD POSITIONING PINK XL (MISCELLANEOUS) ×2 IMPLANT
PORT LAP GEL ALEXIS MED 5-9CM (MISCELLANEOUS) ×3 IMPLANT
PROTECTOR NERVE ULNAR (MISCELLANEOUS) ×4 IMPLANT
RELOAD STAPLE 45 3.5 BLU DVNC (STAPLE) IMPLANT
RELOAD STAPLE 45 4.3 GRN DVNC (STAPLE) IMPLANT
RELOAD STAPLE 60 2.5 WHT DVNC (STAPLE) IMPLANT
RELOAD STAPLE 60 3.5 BLU DVNC (STAPLE) IMPLANT
RELOAD STAPLE 60 4.3 GRN DVNC (STAPLE) IMPLANT
RELOAD STAPLER 2.5X60 WHT DVNC (STAPLE) ×1 IMPLANT
RELOAD STAPLER 3.5X45 BLU DVNC (STAPLE) IMPLANT
RELOAD STAPLER 3.5X60 BLU DVNC (STAPLE) ×2 IMPLANT
RELOAD STAPLER 4.3X45 GRN DVNC (STAPLE) IMPLANT
RELOAD STAPLER 4.3X60 GRN DVNC (STAPLE) IMPLANT
RETRACTOR WND ALEXIS 18 MED (MISCELLANEOUS) IMPLANT
RTRCTR WOUND ALEXIS 18CM MED (MISCELLANEOUS)
SCISSORS LAP 5X35 DISP (ENDOMECHANICALS) ×2 IMPLANT
SEAL CANN UNIV 5-8 DVNC XI (MISCELLANEOUS) ×3 IMPLANT
SEAL XI 5MM-8MM UNIVERSAL (MISCELLANEOUS) ×6
SEALER VESSEL DA VINCI XI (MISCELLANEOUS) ×2
SEALER VESSEL EXT DVNC XI (MISCELLANEOUS) ×1 IMPLANT
SOLUTION ELECTROLUBE (MISCELLANEOUS) ×2 IMPLANT
SPONGE GAUZE 2X2 STER 10/PKG (GAUZE/BANDAGES/DRESSINGS) ×1
STAPLER 45 DA VINCI SURE FORM (STAPLE)
STAPLER 45 SUREFORM DVNC (STAPLE) IMPLANT
STAPLER 60 DA VINCI SURE FORM (STAPLE) ×2
STAPLER 60 SUREFORM DVNC (STAPLE) IMPLANT
STAPLER CANNULA SEAL DVNC XI (STAPLE) ×1 IMPLANT
STAPLER CANNULA SEAL XI (STAPLE) ×2
STAPLER ECHELON POWER CIR 29 (STAPLE) IMPLANT
STAPLER ECHELON POWER CIR 31 (STAPLE) IMPLANT
STAPLER RELOAD 2.5X60 WHITE (STAPLE) ×2
STAPLER RELOAD 2.5X60 WHT DVNC (STAPLE) ×1
STAPLER RELOAD 3.5X45 BLU DVNC (STAPLE)
STAPLER RELOAD 3.5X45 BLUE (STAPLE)
STAPLER RELOAD 3.5X60 BLU DVNC (STAPLE) ×2
STAPLER RELOAD 3.5X60 BLUE (STAPLE) ×4
STAPLER RELOAD 4.3X45 GREEN (STAPLE)
STAPLER RELOAD 4.3X45 GRN DVNC (STAPLE)
STAPLER RELOAD 4.3X60 GREEN (STAPLE)
STAPLER RELOAD 4.3X60 GRN DVNC (STAPLE)
STOPCOCK 4 WAY LG BORE MALE ST (IV SETS) ×4 IMPLANT
SURGILUBE 2OZ TUBE FLIPTOP (MISCELLANEOUS) IMPLANT
SUT MNCRL AB 4-0 PS2 18 (SUTURE) ×2 IMPLANT
SUT PDS AB 1 CT1 27 (SUTURE) ×5 IMPLANT
SUT PROLENE 0 CT 2 (SUTURE) IMPLANT
SUT PROLENE 2 0 KS (SUTURE) IMPLANT
SUT PROLENE 2 0 SH DA (SUTURE) IMPLANT
SUT SILK 2 0 (SUTURE)
SUT SILK 2 0 SH CR/8 (SUTURE) IMPLANT
SUT SILK 2-0 18XBRD TIE 12 (SUTURE) IMPLANT
SUT SILK 3 0 (SUTURE)
SUT SILK 3 0 SH CR/8 (SUTURE) ×2 IMPLANT
SUT SILK 3-0 18XBRD TIE 12 (SUTURE) IMPLANT
SUT V-LOC BARB 180 2/0GR6 GS22 (SUTURE)
SUT VIC AB 3-0 SH 18 (SUTURE) IMPLANT
SUT VIC AB 3-0 SH 27 (SUTURE)
SUT VIC AB 3-0 SH 27XBRD (SUTURE) IMPLANT
SUT VICRYL 0 UR6 27IN ABS (SUTURE) ×2 IMPLANT
SUTURE V-LC BRB 180 2/0GR6GS22 (SUTURE) IMPLANT
SYR 10ML ECCENTRIC (SYRINGE) ×2 IMPLANT
SYS LAPSCP GELPORT 120MM (MISCELLANEOUS)
SYSTEM LAPSCP GELPORT 120MM (MISCELLANEOUS) IMPLANT
TOWEL OR NON WOVEN STRL DISP B (DISPOSABLE) ×2 IMPLANT
TRAY FOLEY MTR SLVR 16FR STAT (SET/KITS/TRAYS/PACK) ×2 IMPLANT
TROCAR ADV FIXATION 5X100MM (TROCAR) ×2 IMPLANT
TUBING CONNECTING 10 (TUBING) ×4 IMPLANT
TUBING INSUFFLATION 10FT LAP (TUBING) ×2 IMPLANT

## 2020-11-28 NOTE — Anesthesia Procedure Notes (Signed)
Procedure Name: Intubation Date/Time: 11/28/2020 8:57 AM Performed by: Lavina Hamman, CRNA Pre-anesthesia Checklist: Patient identified, Emergency Drugs available, Suction available, Patient being monitored and Timeout performed Patient Re-evaluated:Patient Re-evaluated prior to induction Oxygen Delivery Method: Circle system utilized Preoxygenation: Pre-oxygenation with 100% oxygen Induction Type: IV induction Ventilation: Mask ventilation without difficulty and Oral airway inserted - appropriate to patient size Laryngoscope Size: Mac and 4 Grade View: Grade II Tube type: Oral Tube size: 7.5 mm Number of attempts: 1 Airway Equipment and Method: Stylet Placement Confirmation: ETT inserted through vocal cords under direct vision,  positive ETCO2,  CO2 detector and breath sounds checked- equal and bilateral Secured at: 22 cm Tube secured with: Tape Dental Injury: Teeth and Oropharynx as per pre-operative assessment  Comments: ATOI

## 2020-11-28 NOTE — Op Note (Signed)
11/28/2020  1:27 PM  PATIENT:  Sean [name]  60 y.o. male  Patient Care Team: Tammi Sou, MD as PCP - General (Family Medicine) Magrinat, Virgie Dad, MD as Consulting Physician (Oncology) Crista Luria, MD as Consulting Physician (Dermatology) Garlan Fair, MD as Consulting Physician (Gastroenterology) Ladell Pier, MD as Consulting Physician (Oncology) Jonnie Finner, RN as Oncology Nurse Navigator Ladene Artist, MD as Consulting Physician (Gastroenterology) Michael Boston, MD as Consulting Physician (General Surgery)  PRE-OPERATIVE DIAGNOSIS:  CANCER OF APPENDIX  POST-OPERATIVE DIAGNOSIS:   GOBLET CELL ADENOCARCINOMA  PARTIAL SMALL BOWEL OBSTRUCTION INCARCERATED INCISIONAL HERNIA X2  PROCEDURE:   ROBOTIC PROXIMAL COLECTOMY ROBOTIC LYSIS OF ADHESIONS X2 HOURS PRIMARY REPAIR OF INCARCERATED INCISIONAL HERNIA X2 TRANSVERSUS ABDOMINIS PLANE (TAP) BLOCK - BILATERAL  SURGEON:  Adin Hector, MD  ASSISTANT: Annye English, MD   ANESTHESIA:     General  Nerve block provided with liposomal bupivacaine (Experel) mixed with 0.25% bupivacaine as a Bilateral TAP block x 13mL each side at the level of the transverse abdominis & preperitoneal spaces along the flank at the anterior axillary line, from subcostal ridge to iliac crest under laparoscopic guidance   Local field block at port sites & extraction wound  EBL:  Total I/O In: 2000 [I.V.:2000] Out: 350 [Urine:250; Blood:100]  Delay start of Pharmacological VTE agent (>24hrs) due to surgical blood loss or risk of bleeding:  no  DRAINS: none   SPECIMEN:  PROXIMAL "RIGHT" COLON  DISPOSITION OF SPECIMEN:  PATHOLOGY  COUNTS:  YES  PLAN OF CARE: Admit to inpatient   PATIENT DISPOSITION:  PACU - hemodynamically stable.  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  Developed pain and evidence of perforated appendicitis.   Underwent laparoscopic adhesions and appendectomy.  Pathology showed goblet cell adenocarcinoma.  Surgical consultation made with colorectal group.  Recommendation made for colectomy for better margins and lymph node evaluation.  I recommended segmental resection:  The anatomy & physiology of the digestive tract was discussed.  The pathophysiology was discussed.  Natural history risks without surgery was discussed.   I worked to give an overview of the disease and the frequent need to have multispecialty involvement.  I feel the risks of no intervention will lead to serious problems that outweigh the operative risks; therefore, I recommended a partial colectomy to remove the pathology.  Laparoscopic & open techniques were discussed.   Risks such as bleeding, infection, abscess, leak, reoperation, possible ostomy, hernia, heart attack, death, and other risks were discussed.  I noted a good likelihood this will help address the problem.   Goals of post-operative recovery were discussed as well.  We will work to minimize complications.  An educational handout on the pathology was given as well.  Questions were answered.    The patient expresses understanding & wishes to proceed with surgery.  OR FINDINGS: Very dense adhesions involving small bowel omentum.  Rather torturous bowel with internal incarcerated hernias and partial obstruction points.  Inflammation and scarring in the right lower quadrant retroperitoneum but all soft without any obvious ulcerative nodularity. No obvious metastatic disease on visceral parietal peritoneum or liver.   It is an isoperistaltic ileocolonic anastomosis that rests in the epigastric region  CASE DATA:  Type of patient?: Elective [facility] Private Case  Status of Case? Elective Scheduled  Infection Present At Time Of Surgery (PATOS)?  NO   DESCRIPTION:   Informed consent was  confirmed.  The patient underwent general anaesthesia without difficulty.  The patient was  positioned with arms tucked & secured appropriately.  VTE prevention in place.  The patient's abdomen was clipped, prepped, & draped in a sterile fashion.  Surgical timeout confirmed our plan.  The patient was positioned in reverse Trendelenburg.  Abdominal entry was gained using Varess technique at the left subcostal ridge on the anterior abdominal wall.  No elevated EtCO2 noted.  Port placed.  Camera inspection revealed no injury.  Extra ports were carefully placed under direct laparoscopic visualization.  We docked the [employer] robot carefully and placed intstruments under visualization  Patient had very dense adhesions anterior abdominal wall.  We focused on sharp and blunt dissection to free small bowel loops off the anterior abdominal wall and greater omentum.  Then freed off the greater omentum off the anterior abdominal wall.  I spent some extensive time freeing off interloop small bowel adhesions to help delineate the anatomy.  Alternating positioning of the patient.  Freed the small bowel loops out of the pelvis and identified the ileocecal region.  It did do a little lateral medial approach around the cecum to transect through the abdominal sidewall and retroperitoneum to have a healthy cuff of prior adhesions from prior appendectomy.  There was a brownish-yellow nodule in the region that we trimmed off and excised was consistent with necrotic fat.  Not consistent with any metastatic disease or melanoma.  After extensive lysis of adhesions and freed off interloop adhesions from the ileocecal region to the ligament of Treitz.  We did inspection to improve no injury.  We then focused on resection.  I mobilized & reflected the greater omentum and small bowel in the upper abdomen.   I was able to elevate the proximal colon to isolate the ileocolonic pedicle.  I scored the ileal mesentery just proximal to that.   I carried that further dissection in a medial to lateral fashion.  I was able to  bluntly get into the retro-mesenteric plane on the right side.  I freed the proximal right sided colonic mesentery off the retroperitoneum including the duodenal sweep, pancreatic head, & Gerota's fascia of the right kidney. I was able to get underneath the hepatic flexure.  I was able to get underneath the proximal and mid transverse colon.  I isolated the proximal ileocecal pedicle.  I skeletonized it & transected the vessels.    I then proceeded to mobilize the terminal ileum & proximal "right" colon in a lateral to medial fashion.  I mobilized the distal ileal mesentery off its retroperitoneal and pelvic attachments.  I mobilized the ascending colon off It is side wall attachments to the paracolic gutter and retroperitoneum.  I also mobilized the greater omentum off the mid transverse colon and mobilized the mid to proximal transverse colon in a superior to inferior fashion.  This allowed me to mobilize the hepatic flexure and get a complete mobilization of the proximal "right" colon to the mid-transverse colon..  I could isolate the pathology. When he went ahead and proceeded with transection.  I transected the distal ileal mesentery and then transected at the distal ileum with a robotic stapler 62mm blue load.  I then transected transverse colon mesentery just proximal to a dominant middle colic arterial pedicle radially.  Transected at the proximal transverse colon with a robotic stapler.  We assured hemostasis.   I did a side-to-side stapled anastomosis of ileum to mid-transverse colon using a 66mm white load in  an isoperistaltic fashion.  (Distal stump of ileum to mid transverse colon for the distal end of the anastomosis.  Proximal end of colon stump to more proximal ileum for the proximal end of the anastomosis).  I sewed the common staple channel wound with an absorbable suture ( 2-0 V-lock) in a running Elliott fashion from each corner and meeting in the center.  I did meticulous inspection prove an  airtight closure.  I protected the anastomosis line with an anterior omentopexy of greater omentum using V lock suture.  We did reinspection of the abdomen.  Hemostasis was good.   Ureters, retroperitoneum, and bowel uninjured.  The anastomosis looked healthy.   Endoluminal gas was evacuated.  We placed the wound protector through the suprapubic 36mm port site after it was enlarged in a Pfannenstiel fashion.  Specimen removed without incident.   I did diagnostic laparoscopy.  Got some greater omentum come down.  Had had to reduce omentum out of some periumbilical incisional hernias.  They were incarcerated with omentum.  Patient had had some discomfort from his periumbilical hernia and wished me to repair that.  I repaired the 2 obvious Swiss cheese hernias in the midline old incision using #1 PDS using a laparoscopic suture passer on the epigastric region.  For the periumbilical 1 I excised the skin around the umbilicus in a transverse fashion.  Encountered the hernia and closed that primarily with interrupted #1 PDS suture.  I trimmed off excess skin and recheck the umbilical stalk to the fascia.  We confirmed no bowel injury or entrapment.    Ports & wound protector removed.  Hemostasis was good.  Sterile unused instruments were used from this point.  I closed the skin at the port sites using Monocryl stitch and sterile dressing.  I closed the extraction wound using a 0 Vicryl vertical peritoneal closure and a #1 PDS transverse anterior rectal fascial closure like a small Pfannenstiel closure. I closed the skin with some interrupted Monocryl stitches. I placed antibiotic-soaked wicks into the closure at the corners x2.  I placed sterile dressings.     Patient is being extubated go to recovery room. I discussed postop care with the patient in detail the office & in the holding area. Instructions are written. I discussed operative findings, updated the patient's status, discussed probable steps to recovery,  and gave postoperative recommendations to the patient's sister.  Recommendations were made.  Questions were answered.  She expressed understanding & appreciation.   Adin Hector, M.D., F.A.C.S. Gastrointestinal and Minimally Invasive Surgery [facility] [address], [place], [place] [address] [phone] Main / Paging

## 2020-11-28 NOTE — Discharge Instructions (Signed)
SURGERY: POST OP INSTRUCTIONS (Surgery for small bowel obstruction, colon resection, etc)   ######################################################################  EAT Gradually transition to a high fiber diet with a fiber supplement over the next few days after discharge  WALK Walk an hour a day.  Control your pain to do that.    CONTROL PAIN Control pain so that you can walk, sleep, tolerate sneezing/coughing, go up/down stairs.  HAVE A BOWEL MOVEMENT DAILY Keep your bowels regular to avoid problems.  OK to try a laxative to override constipation.  OK to use an antidairrheal to slow down diarrhea.  Call if not better after 2 tries  CALL IF YOU HAVE PROBLEMS/CONCERNS Call if you are still struggling despite following these instructions. Call if you have concerns not answered by these instructions  ######################################################################   DIET Follow a light diet the first few days at home.  Start with a bland diet such as soups, liquids, starchy foods, low fat foods, etc.  If you feel full, bloated, or constipated, stay on a ful liquid or pureed/blenderized diet for a few days until you feel better and no longer constipated. Be sure to drink plenty of fluids every day to avoid getting dehydrated (feeling dizzy, not urinating, etc.). Gradually add a fiber supplement to your diet over the next week.  Gradually get back to a regular solid diet.  Avoid fast food or heavy meals the first week as you are more likely to get nauseated. It is expected for your digestive tract to need a few months to get back to normal.  It is common for your bowel movements and stools to be irregular.  You will have occasional bloating and cramping that should eventually fade away.  Until you are eating solid food normally, off all pain medications, and back to regular activities; your bowels will not be normal. Focus on eating a low-fat, high fiber diet the rest of your life  (See Getting to [place], below).  CARE of your INCISION or WOUND It is good for closed incision and even open wounds to be washed every day.  Shower every day.  Short baths are fine.  Wash the incisions and wounds clean with soap & water.     If you have a closed incision(s), wash the incision with soap & water every day.  You may leave closed incisions open to air if it is dry.   You may cover the incision with clean gauze & replace it after your daily shower for comfort.  It is good for closed incisions and even open wounds to be washed every day.  Shower every day.  Short baths are fine.  Wash the incisions and wounds clean with soap & water.    You may leave closed incisions open to air if it is dry.   You may cover the incision with clean gauze & replace it after your daily shower for comfort.  TEGADERM:  You have clear gauze band-aid dressings over your closed incision(s).  Remove the dressings 3 days after surgery.  If you have an open wound with a wound vac, see wound vac care instructions.     ACTIVITIES as tolerated Start light daily activities --- self-care, walking, climbing stairs-- beginning the day after surgery.  Gradually increase activities as tolerated.  Control your pain to be active.  Stop when you are tired.  Ideally, walk several times a day, eventually an hour a day.   Most people are back to most day-to-day activities in a  few weeks.  It takes 4-8 weeks to get back to unrestricted, intense activity. If you can walk 30 minutes without difficulty, it is safe to try more intense activity such as jogging, treadmill, bicycling, low-impact aerobics, swimming, etc. Save the most intensive and strenuous activity for last (Usually 4-8 weeks after surgery) such as sit-ups, heavy lifting, contact sports, etc.  Refrain from any intense heavy lifting or straining until you are off narcotics for pain control.  You will have off days, but things should improve  week-by-week. DO NOT PUSH THROUGH PAIN.  Let pain be your guide: If it hurts to do something, don't do it.  Pain is your body warning you to avoid that activity for another week until the pain goes down. You may drive when you are no longer taking narcotic prescription pain medication, you can comfortably wear a seatbelt, and you can safely make sudden turns/stops to protect yourself without hesitating due to pain. You may have sexual intercourse when it is comfortable. If it hurts to do something, stop.  MEDICATIONS Take your usually prescribed home medications unless otherwise directed.   Blood thinners:  Usually you can restart any strong blood thinners after the second postoperative day.  It is OK to take aspirin right away.     If you are on strong blood thinners (warfarin/Coumadin, Plavix, Xerelto, Eliquis, Pradaxa, etc), discuss with your surgeon, medicine PCP, and/or cardiologist for instructions on when to restart the blood thinner & if blood monitoring is needed (PT/INR blood check, etc).     PAIN CONTROL Pain after surgery or related to activity is often due to strain/injury to muscle, tendon, nerves and/or incisions.  This pain is usually short-term and will improve in a few months.  To help speed the process of healing and to get back to regular activity more quickly, DO THE FOLLOWING THINGS TOGETHER: 1. Increase activity gradually.  DO NOT PUSH THROUGH PAIN 2. Use Ice and/or Heat 3. Try Gentle Massage and/or Stretching 4. Take over the counter pain medication 5. Take Narcotic prescription pain medication for more severe pain  Good pain control = faster recovery.  It is better to take more medicine to be more active than to stay in bed all day to avoid medications. 1.  Increase activity gradually Avoid heavy lifting at first, then increase to lifting as tolerated over the next 6 weeks. Do not "push through" the pain.  Listen to your body and avoid positions and maneuvers than  reproduce the pain.  Wait a few days before trying something more intense Walking an hour a day is encouraged to help your body recover faster and more safely.  Start slowly and stop when getting sore.  If you can walk 30 minutes without stopping or pain, you can try more intense activity (running, jogging, aerobics, cycling, swimming, treadmill, sex, sports, weightlifting, etc.) Remember: If it hurts to do it, then don't do it! 2. Use Ice and/or Heat You will have swelling and bruising around the incisions.  This will take several weeks to resolve. Ice packs or heating pads (6-8 times a day, 30-60 minutes at a time) will help sooth soreness & bruising. Some people prefer to use ice alone, heat alone, or alternate between ice & heat.  Experiment and see what works best for you.  Consider trying ice for the first few days to help decrease swelling and bruising; then, switch to heat to help relax sore spots and speed recovery. Shower every day.  Short baths  are fine.  It feels good!  Keep the incisions and wounds clean with soap & water.   3. Try Gentle Massage and/or Stretching Massage at the area of pain many times a day Stop if you feel pain - do not overdo it 4. Take over the counter pain medication This helps the muscle and nerve tissues become less irritable and calm down faster Choose ONE of the following over-the-counter anti-inflammatory medications: Acetaminophen 565m tabs (Tylenol) 1-2 pills with every meal and just before bedtime (avoid if you have liver problems or if you have acetaminophen in you narcotic prescription) Naproxen 2248mtabs (ex. Aleve, Naprosyn) 1-2 pills twice a day (avoid if you have kidney, stomach, IBD, or bleeding problems) Ibuprofen 20030mabs (ex. Advil, Motrin) 3-4 pills with every meal and just before bedtime (avoid if you have kidney, stomach, IBD, or bleeding problems) Take with food/snack several times a day as directed for at least 2 weeks to help keep pain /  soreness down & more manageable. 5. Take Narcotic prescription pain medication for more severe pain A prescription for strong pain control is often given to you upon discharge (for example: oxycodone/Percocet, hydrocodone/Norco/Vicodin, or tramadol/Ultram) Take your pain medication as prescribed. Be mindful that most narcotic prescriptions contain Tylenol (acetaminophen) as well - avoid taking too much Tylenol. If you are having problems/concerns with the prescription medicine (does not control pain, nausea, vomiting, rash, itching, etc.), please call us [phone] see if we need to switch you to a different pain medicine that will work better for you and/or control your side effects better. If you need a refill on your pain medication, you must call the office before 4 pm and on weekdays only.  By federal law, prescriptions for narcotics cannot be called into a pharmacy.  They must be filled out on paper & picked up from our office by the patient or authorized caretaker.  Prescriptions cannot be filled after 4 pm nor on weekends.    WHEN TO CALL US [phone] uncontrolled or worsening pain  Fever over 101 F (38.5 C) Concerns with the incision: Worsening pain, redness, rash/hives, swelling, bleeding, or drainage Reactions / problems with new medications (itching, rash, hives, nausea, etc.) Nausea and/or vomiting Difficulty urinating Difficulty breathing Worsening fatigue, dizziness, lightheadedness, blurred vision Other concerns If you are not getting better after two weeks or are noticing you are getting worse, contact our office [phone] for further advice.  We may need to adjust your medications, re-evaluate you in the office, send you to the emergency room, or see what other things we can do to help. The clinic staff is available to answer your questions during regular business hours (8:30am-5pm).  Please don't hesitate to call and ask to speak to one of our nurses for  clinical concerns.    A surgeon from [facility] is always on call at the hospitals 24 hours/day If you have a medical emergency, go to the nearest emergency room or call 911.  FOLLOW UP in our office One the day of your discharge from the hospital (or the next business weekday), please call [facility] to set up or confirm an appointment to see your surgeon in the office for a follow-up appointment.  Usually it is 2-3 weeks after your surgery.   If you have skin staples at your incision(s), let the office know so we can set up a time in the office for the nurse to remove them (usually around  10 days after surgery). Make sure that you call for appointments the day of discharge (or the next business weekday) from the hospital to ensure a convenient appointment time. IF YOU HAVE DISABILITY OR FAMILY LEAVE FORMS, BRING THEM TO THE OFFICE FOR PROCESSING.  DO NOT GIVE THEM TO YOUR DOCTOR.  [facility] [address], [place], [place], [place]  [address] ? [phone] - Main [phone] - [place],  [phone] - Fax [email or web address]  GETTING TO GOOD BOWEL HEALTH. It is expected for your digestive tract to need a few months to get back to normal.  It is common for your bowel movements and stools to be irregular.  You will have occasional bloating and cramping that should eventually fade away.  Until you are eating solid food normally, off all pain medications, and back to regular activities; your bowels will not be normal.   Avoiding constipation The goal: ONE SOFT BOWEL MOVEMENT A DAY!    Drink plenty of fluids.  Choose water first. TAKE A FIBER SUPPLEMENT EVERY DAY THE REST OF YOUR LIFE During your first week back home, gradually add back a fiber supplement every day Experiment which form you can tolerate.   There are many forms such as powders, tablets, wafers, gummies, etc Psyllium bran (Metamucil), methylcellulose  (Citrucel), Miralax or Glycolax, Benefiber, Flax Seed.  Adjust the dose week-by-week (1/2 dose/day to 6 doses a day) until you are moving your bowels 1-2 times a day.  Cut back the dose or try a different fiber product if it is giving you problems such as diarrhea or bloating. Sometimes a laxative is needed to help jump-start bowels if constipated until the fiber supplement can help regulate your bowels.  If you are tolerating eating & you are farting, it is okay to try a gentle laxative such as double dose MiraLax, prune juice, or Milk of Magnesia.  Avoid using laxatives too often. Stool softeners can sometimes help counteract the constipating effects of narcotic pain medicines.  It can also cause diarrhea, so avoid using for too long. If you are still constipated despite taking fiber daily, eating solids, and a few doses of laxatives, call our office. Controlling diarrhea Try drinking liquids and eating bland foods for a few days to avoid stressing your intestines further. Avoid dairy products (especially milk & ice cream) for a short time.  The intestines often can lose the ability to digest lactose when stressed. Avoid foods that cause gassiness or bloating.  Typical foods include beans and other legumes, cabbage, broccoli, and dairy foods.  Avoid greasy, spicy, fast foods.  Every person has some sensitivity to other foods, so listen to your body and avoid those foods that trigger problems for you. Probiotics (such as active yogurt, Align, etc) may help repopulate the intestines and colon with normal bacteria and calm down a sensitive digestive tract Adding a fiber supplement gradually can help thicken stools by absorbing excess fluid and retrain the intestines to act more normally.  Slowly increase the dose over a few weeks.  Too much fiber too soon can backfire and cause cramping & bloating. It is okay to try and slow down diarrhea with a few doses of antidiarrheal medicines.   Bismuth  subsalicylate (ex. Kayopectate, Pepto Bismol) for a few doses can help control diarrhea.  Avoid if pregnant.   Loperamide (Imodium) can slow down diarrhea.  Start with one tablet (71m) first.  Avoid if you are having fevers or severe pain.  ILEOSTOMY PATIENTS WILL HAVE CHRONIC DIARRHEA since their colon is not in use.    Drink plenty of liquids.  You will need to drink even more glasses of water/liquid a day to avoid getting dehydrated. Record output from your ileostomy.  Expect to empty the bag every 3-4 hours at first.  Most people with a permanent ileostomy empty their bag 4-6 times at the least.   Use antidiarrheal medicine (especially Imodium) several times a day to avoid getting dehydrated.  Start with a dose at bedtime & breakfast.  Adjust up or down as needed.  Increase antidiarrheal medications as directed to avoid emptying the bag more than 8 times a day (every 3 hours). Work with your wound ostomy nurse to learn care for your ostomy.  See ostomy care instructions. TROUBLESHOOTING IRREGULAR BOWELS 1) Start with a soft & bland diet. No spicy, greasy, or fried foods.  2) Avoid gluten/wheat or dairy products from diet to see if symptoms improve. 3) Miralax 17gm or flax seed mixed in 8oz. water or juice-daily. May use 2-4 times a day as needed. 4) Gas-X, Phazyme, etc. as needed for gas & bloating.  5) Prilosec (omeprazole) over-the-counter as needed 6)  Consider probiotics (Align, Activa, etc) to help calm the bowels down  Call your doctor if you are getting worse or not getting better.  Sometimes further testing (cultures, endoscopy, X-ray studies, CT scans, bloodwork, etc.) may be needed to help diagnose and treat the cause of the diarrhea. [facility] [address], [address], [place], [place]  [address] [phone] - Main.    [phone]  - Toll Free.   [phone] - Fax [email or web address]    Colorectal Cancer  Colorectal cancer is a  cancerous (malignant) tumor in the colon or rectum, which are parts of the large intestine. A tumor is a mass of cells or tissue. The cancer can spread (metastasize) to other parts of the body. What are the causes? This condition is usually caused by abnormal growths called polyps on the inner wall of the colon or rectum. Left untreated, these polyps can develop into cancer. Other times, abnormal changes to genes (gene mutations) can cause cells to become cancerous. What increases the risk? The following factors may make you more likely to develop this condition:  Being older than age 50.  Having a personal or family history of colorectal cancer or polyps in your colon.  Having diabetes, or having had cancer and cancer treatments such as radiation before.  Having certain hereditary conditions, such as: ? Lynch syndrome. ? Familial adenomatous polyposis. ? Turcot syndrome. ? Peutz-Jeghers syndrome. ? MUTYH-associated polyposis (MAP).  Being overweight or obese.  Having a diet that is: ? High in red meats, such as beef, pork, lamb, or liver. ? High in precooked, cured, or other processed meat, such as sausages, meat loaves, and hot dogs. ? Low in fiber, such as fiber found in whole grains, fruits, and vegetables.  Being inactive (sedentary), smoking, or drinking too much alcohol.  Having an inflammatory bowel disease, such as ulcerative colitis or Crohn's disease. What are the signs or symptoms? Early colorectal cancer often does not cause symptoms. As the cancer grows, symptoms may include:  Changes in bowel habits.  Feeling like the bowel does not empty completely after a bowel movement.  Stools (feces) that are narrower than usual, or blood in the stool or toilet after a bowel movement. The blood may be bright red or very dark in color.    Diarrhea, constipation, or frequent gas pain.  Anemia, constant tiredness (fatigue), or nausea and vomiting.  Discomfort, pain, bloating,  fullness, or cramps in the abdomen.  Unexplained weight loss. How is this diagnosed? This condition may be diagnosed with:  A medical history.  A physical exam.  Tests. These may include: ? An exam of the rectum using a gloved finger (digital rectal exam). ? A stool test called a fecal occult blood test. ? Blood tests. ? A biopsy. This is removal of a tissue sample from the colon or rectum to be looked at under a microscope. You may also have other tests, including:  X-rays, CT scans, MRIs, or a PET scan.  A sigmoidoscopy. This test is done to view the inside of the rectum.  A colonoscopy. This test is done to view the inside of the colon. During this test, small polyps can be removed or biopsies may be taken.  An endorectal ultrasound. This test checks how deep a tumor in the rectum has grown and whether the cancer has spread to lymph nodes or other nearby tissues. Additional tests may be done to find out whether the cancer has spread to other parts of the body (what stage it is). The stages of cancer include:  Stage 0 - At this stage, the cancer is found only in the innermost lining of the colon or rectum. The tumor has not spread to other tissue.  Stage 1 (I) - At this stage, the cancer has grown into the inner wall (muscle layer) of the colon or rectum.  Stage 2 (II) - At this stage, the cancer has grown more deeply into the wall of the colon or rectum or through the wall. It may have invaded nearby tissue or organs.  Stage 3 (III) - At this stage, the cancer has spread to nearby lymph nodes or tissue near the lymph nodes.  Stage 4 (IV) - At this stage, the cancer has spread to other parts of the body that are not near the colon, such as the liver or lungs. How is this treated? Treatment for this condition depends on the type and stage of the cancer. Treatment may include:  Surgery. In the early stages of the cancer, surgery may be done to remove polyps or small tumors from  the colon. In later stages, surgery may be done to remove part of the colon (partial colectomy).  Chemotherapy. This treatment uses medicines to kill cancer cells.  Targeted therapy. This treatment can kill tumor cells by targeting specific gene mutations or proteins that the cancer expresses.  Immunotherapy (biologic therapy). This treatment uses your body's disease-fighting system (immune system) to fight the cancer. Substances made by your body or in a laboratory are used to boost, direct, or restore your body's natural defenses against cancer.  Radiation therapy. This treatment uses radiation to kill cancer cells or shrink tumors.  Radiofrequency ablation. This treatment uses radio waves to destroy the tumors that may have spread to other areas of the body, such as the liver. Follow these instructions at home:  Take over-the-counter and prescription medicines only as told by your health care provider.  Try to eat regular, healthy meals. Some of your treatments might affect your appetite. Ask to meet with a dietitian if you are having problems eating or with your appetite.  Consider joining a support group. This may help you learn about your diagnosis and manage the stress of having colorectal cancer.  If you are admitted to the hospital,  tell your cancer care team.  Keep all follow-up visits. This is important. How is this prevented?  Colorectal cancer can be prevented with screening tests that find polyps so they can be removed before they develop into cancer.  All adults should have screening for colorectal cancer starting at age 19 and continuing until age 20. Your health care provider may recommend screening before age 35. People at increased risk should start screening at an earlier age.  You may be able to help reduce your risk of developing colorectal cancer by staying at a healthy weight, eating a healthy diet, avoiding tobacco and alcohol use, and being physically  active. Where to find more information  American Cancer Society: cancer.[place] cancer.gov Contact a health care provider if:  Your diarrhea or constipation does not go away.  You have blood in your stool or in the toilet after a bowel movement.  Your bowel habits change.  You have increased pain in your abdomen.  You notice new fatigue or weakness.  You lose weight without a known reason. Get help right away if:  You have increased bleeding from the rectum.  You have any uncontrollable or severe abdominal symptoms. Summary  Colorectal cancer is a cancerous (malignant) tumor in the colon or rectum, which are parts of the large intestine.  Common risk factors for this condition include being older than age 90, having a personal or family history of colorectal cancer or colon polyps, having certain hereditary conditions, or having conditions such as diabetes or inflammatory bowel disease.  This condition may be diagnosed with tests, such as a colonoscopy and biopsy.  Treatment depends on the type and stage of the cancer. Often, treatment includes surgery to remove the abnormal tissue, along with chemotherapy, targeted therapy, or immunotherapy.  Keep all follow-up visits. This is important. This information is not intended to replace advice given to you by your health care provider. Make sure you discuss any questions you have with your health care provider. Document Revised: 10/26/2019 Document Reviewed: 10/26/2019 Elsevier Patient Education  2021 [employer].

## 2020-11-28 NOTE — Anesthesia Preprocedure Evaluation (Addendum)
Anesthesia Evaluation  Patient identified by MRN, date of birth, ID band Patient awake    Reviewed: Allergy & Precautions, NPO status , Patient's Chart, lab work & pertinent test results  Airway Mallampati: II  TM Distance: >3 FB Neck ROM: Full    Dental  (+) Teeth Intact, Dental Advisory Given   Pulmonary neg pulmonary ROS,    breath sounds clear to auscultation       Cardiovascular negative cardio ROS   Rhythm:Regular Rate:Normal     Neuro/Psych negative neurological ROS  negative psych ROS   GI/Hepatic negative GI ROS, Neg liver ROS,   Endo/Other  negative endocrine ROS  Renal/GU negative Renal ROS     Musculoskeletal  (+) Arthritis ,   Abdominal Normal abdominal exam  (+)   Peds  Hematology negative hematology ROS (+)   Anesthesia Other Findings   Reproductive/Obstetrics                            Anesthesia Physical Anesthesia Plan  ASA: II  Anesthesia Plan: General   Post-op Pain Management:    Induction: Intravenous  PONV Risk Score and Plan: 3 and Ondansetron, Dexamethasone and Midazolam  Airway Management Planned: Oral ETT  Additional Equipment: None  Intra-op Plan:   Post-operative Plan: Extubation in OR  Informed Consent: I have reviewed the patients History and Physical, chart, labs and discussed the procedure including the risks, benefits and alternatives for the proposed anesthesia with the patient or authorized representative who has indicated his/her understanding and acceptance.     Dental advisory given  Plan Discussed with: CRNA  Anesthesia Plan Comments: (2 IV's  Lab Results      Component                Value               Date                      WBC                      5.6                 11/19/2020                HGB                      14.5                11/19/2020                HCT                      44.3                11/19/2020                 MCV                      87.9                11/19/2020                PLT                      256  11/19/2020            )       Anesthesia Quick Evaluation

## 2020-11-28 NOTE — Anesthesia Postprocedure Evaluation (Signed)
Anesthesia Post Note  Patient: [name]  Procedure(s) Performed: ROBOTIC PROXIMAL COLECTOMY, LYSIS OF ADHESIONS X2 HOURS, PRIMARY REPAIR OF INCARCERATED INCISIONAL HERNIA X2, BILATERAL TAP BLOCK (N/A Abdomen)     Patient location during evaluation: PACU Anesthesia Type: General Level of consciousness: awake and alert Pain management: pain level controlled Vital Signs Assessment: post-procedure vital signs reviewed and stable Respiratory status: spontaneous breathing, nonlabored ventilation, respiratory function stable and patient connected to nasal cannula oxygen Cardiovascular status: blood pressure returned to baseline and stable Postop Assessment: no apparent nausea or vomiting Anesthetic complications: no   No complications documented.  Last Vitals:  Vitals:   11/28/20 1452 11/28/20 1606  BP: (!) 135/93 (!) 142/87  Pulse: 90 75  Resp: 16 18  Temp: 36.9 C 36.8 C  SpO2: 96% 95%    Last Pain:  Vitals:   11/28/20 1606  TempSrc: Oral  PainSc:                  Effie Berkshire

## 2020-11-28 NOTE — Transfer of Care (Signed)
Immediate Anesthesia Transfer of Care Note  Patient: Sean [name]  Procedure(s) Performed: Procedure(s): ROBOTIC PROXIMAL COLECTOMY, LYSIS OF ADHESIONS X2 HOURS, PRIMARY REPAIR OF INCARCERATED INCISIONAL HERNIA X2, BILATERAL TAP BLOCK (N/A)  Patient Location: PACU  Anesthesia Type:General  Level of Consciousness:  sedated, patient cooperative and responds to stimulation  Airway & Oxygen Therapy:Patient Spontanous Breathing and Patient connected to face mask oxgen  Post-op Assessment:  Report given to PACU RN and Post -op Vital signs reviewed and stable  Post vital signs:  Reviewed and stable  Last Vitals:  Vitals:   11/28/20 0702  BP: (!) 139/97  Pulse: 73  Resp: 16  Temp: 37.1 C  SpO2: 834%    Complications: No apparent anesthesia complications

## 2020-11-28 NOTE — Interval H&P Note (Signed)
History and Physical Interval Note:  11/28/2020 7:49 AM  Sean [name]  has presented today for surgery, with the diagnosis of CANCER OF APPENDIX.  The various methods of treatment have been discussed with the patient and family. After consideration of risks, benefits and other options for treatment, the patient has consented to  Procedure(s): ROBOTIC COLECTOMY PROXIMAL (N/A) as a surgical intervention.  The patient's history has been reviewed, patient examined, no change in status, stable for surgery.  I have reviewed the patient's chart and labs.  Questions were answered to the patient's satisfaction.    I have re-reviewed the the patient's records, history, medications, and allergies.  I have re-examined the patient.  I again discussed intraoperative plans and goals of post-operative recovery.  The patient agrees to proceed.  Sean [name]  Nov 22, 1960 [id number]  Patient Care Team: Tammi Sou, MD as PCP - General (Family Medicine) Magrinat, Virgie Dad, MD as Consulting Physician (Oncology) Crista Luria, MD as Consulting Physician (Dermatology) Garlan Fair, MD as Consulting Physician (Gastroenterology) Ladell Pier, MD as Consulting Physician (Oncology) Jonnie Finner, RN as Oncology Nurse Navigator Ladene Artist, MD as Consulting Physician (Gastroenterology) Michael Boston, MD as Consulting Physician (General Surgery)  Patient Active Problem List   Diagnosis Date Noted   Metastatic malignant melanoma [facility]) 1989    Priority: High   Prediabetes    Asymmetrical sensorineural hearing loss    Acute gangrenous appendicitis s/p lap appendectomy 09/28/2020 09/28/2020   History of adenomatous polyp of colon 07/2016   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  Pt declined MRI brain at that time.   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  CT chest neg for metastatic dz, CEA neg.   History of adenomatous polyp of colon 07/2016   History of vitamin D deficiency    still present as of 09/2017.  High dose vit D started 10/16/17.  Increased dose to 50K twice per week 02/18/18.   Hyperlipidemia    Mild, never meds   Metastatic malignant melanoma ([place]) 1989   Recurrence L upper arm 1993.  Then left back and LUL lung recurrence 1995-resected.  Brain lesion recurrence 1996-resected.  RLL lung lesion 1996--chemo & then resection.  Small bowel recurrence resected 1997, then mesenteric lesion resected 1998.  No sign of recurrence since 1998.  Released by onc/Dr. Magrinot as of 12/2015 (needs annual dermatologist exam and annual ophth exam).   Pre-diabetes    Prediabetes    A1c 5.9% 12/2014.  6.2% 09/2017. 6.3% 04/2019.    Past Surgical History:  Procedure Laterality Date   APPENDECTOMY     BRAIN SURGERY     Removal of melanoma met   COLONOSCOPY  2009; 07/2016   Dr. Wynetta Emery at [place]; next colonoscopy due 07/2019.   COLONOSCOPY WITH PROPOFOL N/A 08/18/2016   Tubular adenoma.  Recall 3 yrs.  Procedure: COLONOSCOPY WITH PROPOFOL;  Surgeon: Garlan Fair, MD;  Location: [facility] ENDOSCOPY;  Service: Endoscopy;  Laterality: N/A;   IR SINUS/FIST TUBE CHK-NON GI  10/08/2020   LAPAROSCOPIC APPENDECTOMY N/A 09/28/2020  Procedure: APPENDECTOMY LAPAROSCOPIC;  Surgeon: Erroll Luna, MD;  Location: [facility] ORS;  Service: General;  Laterality: N/A;   LUNG SURGERY     "              "          "        "   [place]   x2 from melanoma    Social History   Socioeconomic History   Marital status: Single    Spouse name: Not on file   Number  of children: 2   Years of education: Not on file   Highest education level: Not on file  Occupational History   Not on file  Tobacco Use   Smoking status: Never Smoker   Smokeless tobacco: Former Counsellor Use: Never used  Substance and Sexual Activity   Alcohol use: Not Currently   Drug use: No   Sexual activity: Yes  Other Topics Concern   Not on file  Social History Narrative   Married, 2 sons.   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   No T/A/Ds.   Diet ok except fast foods.   Social Determinants of Health   Financial Resource Strain: Not on file  Food Insecurity: Not on file  Transportation Needs: Not on file  Physical Activity: Not on file  Stress: Not on file  Social Connections: Not on file  Intimate Partner Violence: Not on file    Family History  Problem Relation Age of Onset   Cancer Mother        breast   Alcohol abuse Father    Colon cancer Neg Hx    Esophageal cancer Neg Hx    Stomach cancer Neg Hx    Rectal cancer Neg Hx     Facility-Administered Medications Prior to Admission  Medication Dose Route Frequency Provider Last Rate Last Admin   0.9 %  sodium chloride infusion  500 mL Intravenous Once Milus Banister, MD       Medications Prior to Admission  Medication Sig Dispense Refill Last Dose   Cholecalciferol (VITAMIN D3) 50 MCG (2000 UT) TABS Take 2,000 Units by mouth in the morning.   Past Week at Unknown time   neomycin (MYCIFRADIN) 500 MG tablet Take 1,000 mg by mouth 2 (two) times daily.   11/27/2020 at Unknown time   acetaminophen (TYLENOL) 500 MG tablet Take 2 tablets (1,000 mg total) by mouth every 6 (six) hours as needed. (Patient not taking: No sig reported) 100 tablet 2 Not Taking at Unknown time   oxyCODONE (ROXICODONE) 5 MG immediate release tablet Take 1 tablet (5 mg total) by mouth every 6 (six) hours as needed. (Patient not taking: No sig reported) 20 tablet 0 Not Taking at Unknown time   zolpidem  (AMBIEN) 10 MG tablet Take 1 tablet (10 mg total) by mouth at bedtime as needed for sleep. (Patient not taking: Reported on 11/06/2020) 10 tablet 0 Not Taking at Unknown time    Current Facility-Administered Medications  Medication Dose Route Frequency Provider Last Rate Last Admin   bupivacaine liposome (EXPAREL) 1.3 % injection 266 mg  20 mL Infiltration Once Michael Boston, MD       clindamycin (CLEOCIN) IVPB 900 mg  900 mg Intravenous 60 min Pre-Op Michael Boston, MD       And   gentamicin (GARAMYCIN) 380 mg in dextrose 5 % 100 mL IVPB  5 mg/kg (Adjusted) Intravenous 60 min Pre-Op Michael Boston, MD       feeding supplement (ENSURE PRE-SURGERY) liquid 296 mL  296 mL Oral Once Michael Boston, MD       lactated ringers infusion   Intravenous Continuous Merlinda Frederick, MD 10 mL/hr at 11/28/20 0645 New Bag at 11/28/20 0645     Allergies  Allergen Reactions   Aspirin Nausea And Vomiting   Penicillins Other (See Comments)    Childhood  Has patient had a PCN reaction causing immediate rash, facial/tongue/throat swelling, SOB or lightheadedness with hypotension: unknown Has patient had a PCN reaction causing severe rash involving mucus membranes or skin necrosis: {unkjnown Has patient had a PCN reaction that required hospitalization {no Has patient had a PCN reaction occurring within the last 10 years: no If all of the above answers are "NO", then may proceed with Cephalosporin use.    BP (!) 139/97   Pulse 73   Temp 98.7 F (37.1 C) (Oral)   Resp 16   Ht _0  (1.727 m)   Wt 83 kg   SpO2 100%   BMI 27.83 kg/m   Labs: Results for orders placed or performed during the hospital encounter of 11/26/20 (from the past 48 hour(s))  SARS CORONAVIRUS 2 (TAT 6-24 HRS) Nasopharyngeal Nasopharyngeal Swab     Status: None   Collection Time: 11/26/20 11:59 AM   Specimen: Nasopharyngeal Swab  Result Value Ref Range   SARS Coronavirus 2 NEGATIVE NEGATIVE    Comment: (NOTE) SARS-CoV-2 target  nucleic acids are NOT DETECTED.  The SARS-CoV-2 RNA is generally detectable in upper and lower respiratory specimens during the acute phase of infection. Negative results do not preclude SARS-CoV-2 infection, do not rule out co-infections with other pathogens, and should not be used as the sole basis for treatment or other patient management decisions. Negative results must be combined with clinical observations, patient history, and epidemiological information. The expected result is Negative.  Fact Sheet for Patients: SugarRoll.be  Fact Sheet for Healthcare Providers: https://www.woods-mathews.com/  This test is not yet approved or cleared by the [place] FDA and  has been authorized for detection and/or diagnosis of SARS-CoV-2 by FDA under an Emergency Use Authorization (EUA). This EUA will remain  in effect (meaning this test can be used) for the duration of the COVID-19 declaration under Se ction 564(b)(1) of the Act, 21 U.S.C. section 360bbb-3(b)(1), unless the authorization is terminated or revoked sooner.  Performed at [facility], [place] [address] [place], [place] [address]     Imaging / Studies: No results found.   Adin Hector, M.D., F.A.C.S. Gastrointestinal and Minimally Invasive Surgery [facility] [address], [place], [place] [address] [phone] Main / Paging  11/28/2020 7:49 AM    Adin Hector

## 2020-11-29 ENCOUNTER — Telehealth: Payer: Self-pay | Admitting: *Deleted

## 2020-11-29 ENCOUNTER — Other Ambulatory Visit (HOSPITAL_COMMUNITY): Payer: Self-pay

## 2020-11-29 LAB — BASIC METABOLIC PANEL
Anion gap: 6 (ref 5–15)
BUN: 12 mg/dL (ref 6–20)
CO2: 27 mmol/L (ref 22–32)
Calcium: 8.7 mg/dL — ABNORMAL LOW (ref 8.9–10.3)
Chloride: 104 mmol/L (ref 98–111)
Creatinine, Ser: 0.8 mg/dL (ref 0.61–1.24)
GFR, Estimated: >60 mL/min (ref >60)
Glucose, Bld: 124 mg/dL — ABNORMAL HIGH (ref 70–99)
Potassium: 4.2 mmol/L (ref 3.5–5.1)
Sodium: 137 mmol/L (ref 135–145)

## 2020-11-29 LAB — CBC
HCT: 40.1 % (ref 39.0–52.0)
Hemoglobin: 13.1 g/dL (ref 13.0–17.0)
MCH: 28.5 pg (ref 26.0–34.0)
MCHC: 32.7 g/dL (ref 30.0–36.0)
MCV: 87.2 fL (ref 80.0–100.0)
Platelets: 213 10*3/uL (ref 150–400)
RBC: 4.6 MIL/uL (ref 4.22–5.81)
RDW: 12.6 % (ref 11.5–15.5)
WBC: 11.1 10*3/uL — ABNORMAL HIGH (ref 4.0–10.5)
nRBC: 0 % (ref 0.0–0.2)

## 2020-11-29 LAB — MAGNESIUM: Magnesium: 2 mg/dL (ref 1.7–2.4)

## 2020-11-29 NOTE — Telephone Encounter (Signed)
Pt is currently hospitalized.

## 2020-11-29 NOTE — Progress Notes (Signed)
Sean [name] [id number] 1961/04/14  CARE TEAM:  PCP: Tammi Sou, MD  Outpatient Care Team: Patient Care Team: Tammi Sou, MD as PCP - General (Family Medicine) Magrinat, Virgie Dad, MD as Consulting Physician (Oncology) Crista Luria, MD as Consulting Physician (Dermatology) Garlan Fair, MD as Consulting Physician (Gastroenterology) Ladell Pier, MD as Consulting Physician (Oncology) Jonnie Finner, RN as Oncology Nurse Navigator Ladene Artist, MD as Consulting Physician (Gastroenterology) Michael Boston, MD as Consulting Physician (General Surgery)  Inpatient Treatment Team: Treatment Team: Attending Provider: Michael Boston, MD; Technician: Lewanda Rife, NT; Consulting Physician: Michael Boston, MD; Registered Nurse: Aura Dials, RN; Pharmacist: Eudelia Bunch, [facility]   Problem List:   Principal Problem:   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 Active Problems:   Metastatic malignant melanoma ([place])   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  Follow-up pathology.  Advance to dysphagia 1 diet.  I would hold off on solid diet until tomorrow improved of no ileus since he had extensive lyse adhesions and his history of prolonged ileus is from prior surgeries.  Pain control regimen.  He prefers morphine.  Robaxin as backup.  -VTE prophylaxis- SCDs, etc -mobilize as tolerated  to help recovery  Dr. Dema Severin, my colorectal partner, to help follow until I return Monday  Disposition:  Disposition:  The patient is from: Home  Anticipate discharge to:  Home  Anticipated Date of Discharge is:  May 16,2022    Barriers to discharge:  Pending Clinical improvement (more likely than not)  Patient currently is NOT MEDICALLY STABLE for discharge from the hospital from a surgery standpoint.      25 minutes spent in review, evaluation, examination, counseling, and coordination of care.   I have reviewed this patient's available data, including medical history, events of note, physical examination and test results as part of my evaluation.  A significant portion of that time was spent in counseling.  Care during the described time interval was provided by me.  11/29/2020    Subjective: (Chief complaint)  No major events.  Prefers morphine with his history of prior numerous surgeries for his metastatic melanoma and perforated appendix.  Feeling a little full and bloated but not nauseated.  Tolerated liquids and wished to try thicker dysphagia 1/full  Objective:  Vital signs:  Vitals:   11/28/20 1822 11/28/20 2210 11/29/20 0224 11/29/20 0632  BP: 134/90 108/74 113/77 115/74  Pulse: 84 85 68 (!) 48  Resp: 18 14 15 15   Temp: 98.7 F (37.1 C) 98.2 F (36.8 C) 98.1 F (36.7 C) 98.1 F (36.7 C)  TempSrc: Oral Oral Oral Oral  SpO2: 96% 96% 96% 96%  Weight:      Height:        Last BM Date: 11/28/20  Intake/Output   Yesterday:  05/11 0701 - 05/12 0700 In: 3326.7 [P.O.:720; I.V.:2506.7; IV Piggyback:100] Out: 2500 [Urine:2400; Blood:100] This shift:  No intake/output data recorded.  Bowel function:  Flatus: No  BM:  No  Drain: (No drain)   Physical Exam:  General: Pt awake/alert in no acute distress Eyes: PERRL, normal EOM.  Sclera clear.  No icterus Neuro: CN II-XII intact w/o focal sensory/motor deficits. Lymph: No head/neck/groin  lymphadenopathy Psych:  No delerium/psychosis/paranoia.  Oriented x 4 HENT: Normocephalic, Mucus membranes moist.  No thrush Neck: Supple, No tracheal deviation.  No obvious thyromegaly Chest: No pain to chest wall compression.  Good respiratory excursion.  No audible wheezing CV:  Pulses intact.  Regular rhythm.  No major extremity edema MS: Normal AROM mjr joints.  No obvious deformity  Abdomen: Soft.  Moderately distended.  Mildly tender at incisions only.  No evidence of peritonitis.  No incarcerated hernias.  Ext:  No deformity.  No mjr edema.  No cyanosis Skin: No petechiae / purpurea.  No major sores.  Warm and dry    Results:   Cultures: Recent Results (from the past 720 hour(s))  SARS CORONAVIRUS 2 (TAT 6-24 HRS) Nasopharyngeal Nasopharyngeal Swab     Status: None   Collection Time: 11/26/20 11:59 AM   Specimen: Nasopharyngeal Swab  Result Value Ref Range Status   SARS Coronavirus 2 NEGATIVE NEGATIVE Final    Comment: (NOTE) SARS-CoV-2 target nucleic acids are NOT DETECTED.  The SARS-CoV-2 RNA is generally detectable in upper and lower respiratory specimens during the acute phase of infection. Negative results do not preclude SARS-CoV-2 infection, do not rule out co-infections with other pathogens, and should not be used as the sole basis for treatment or other patient management decisions. Negative results must be combined with clinical observations, patient history, and epidemiological information. The expected result is Negative.  Fact Sheet for Patients: SugarRoll.be  Fact Sheet for Healthcare Providers: https://www.woods-mathews.com/  This test is not yet approved or cleared by the [place] FDA and  has been authorized for detection and/or diagnosis of SARS-CoV-2 by FDA under an Emergency Use Authorization (EUA). This EUA will remain  in effect (meaning this test can be used) for the duration of the COVID-19  declaration under Se ction 564(b)(1) of the Act, 21 U.S.C. section 360bbb-3(b)(1), unless the authorization is terminated or revoked sooner.  Performed at [facility], [place] [address], [place], [place] [address]     Labs: Results for orders placed or performed during the hospital encounter of 11/28/20 (from the past 48 hour(s))  Type and screen [place]     Status: None   Collection Time: 11/28/20  8:20 AM  Result Value Ref Range   ABO/RH(D) A POS    Antibody Screen NEG    Sample Expiration      12/01/2020,2359 Performed at [facility], [place] [address], [place], [place] [address]   ABO/Rh     Status: None   Collection Time: 11/28/20  8:25 AM  Result Value Ref Range   ABO/RH(D)      A POS Performed at [facility], [place] [address], [place], [place] [address]   Basic metabolic panel     Status: Abnormal   Collection Time: 11/29/20  4:27 AM  Result Value Ref Range   Sodium 137 135 - 145 mmol/L   Potassium 4.2 3.5 - 5.1 mmol/L   Chloride 104 98 - 111 mmol/L   CO2 27 22 - 32 mmol/L   Glucose, Bld 124 (H) 70 - 99 mg/dL    Comment: Glucose reference range applies only to samples taken after fasting for at least  8 hours.   BUN 12 6 - 20 mg/dL   Creatinine, Ser 0.80 0.61 - 1.24 mg/dL   Calcium 8.7 (L) 8.9 - 10.3 mg/dL   GFR, Estimated >60 >60 mL/min    Comment: (NOTE) Calculated using the CKD-EPI Creatinine Equation (2021)    Anion gap 6 5 - 15    Comment: Performed at [facility], [place] [address] [place], [place] [address]  CBC     Status: Abnormal   Collection Time: 11/29/20  4:27 AM  Result Value Ref Range   WBC 11.1 (H) 4.0 - 10.5 K/uL   RBC 4.60 4.22 - 5.81 MIL/uL   Hemoglobin 13.1 13.0 - 17.0 g/dL   HCT 40.1 39.0 - 52.0 %   MCV 87.2 80.0 - 100.0 fL   MCH 28.5 26.0 - 34.0 pg   MCHC 32.7 30.0 - 36.0 g/dL   RDW 12.6 11.5 - 15.5 %   Platelets 213 150 - 400 K/uL   nRBC 0.0 0.0 - 0.2 %     Comment: Performed at [facility], [place] [address] [place], [place] [address]  Magnesium     Status: None   Collection Time: 11/29/20  4:27 AM  Result Value Ref Range   Magnesium 2.0 1.7 - 2.4 mg/dL    Comment: Performed at [facility], [place] [address] [place], [place] [address]    Imaging / Studies: No results found.  Medications / Allergies: per chart  Antibiotics: Anti-infectives (From admission, onward)   Start     Dose/Rate Route Frequency Ordered Stop   11/28/20 1630  clindamycin (CLEOCIN) IVPB 900 mg        900 mg 100 mL/hr over 30 Minutes Intravenous Every 8 hours 11/28/20 1410 11/28/20 1812   11/28/20 1400  neomycin (MYCIFRADIN) tablet 1,000 mg  Status:  Discontinued       "And" Linked Group Details   1,000 mg Oral 3 times per day 11/28/20 0629 11/28/20 0637   11/28/20 1400  metroNIDAZOLE (FLAGYL) tablet 1,000 mg  Status:  Discontinued       "And" Linked Group Details   1,000 mg Oral 3 times per day 11/28/20 0629 11/28/20 0637   11/28/20 0600  clindamycin (CLEOCIN) IVPB 900 mg       "And" Linked Group Details   900 mg 100 mL/hr over 30 Minutes Intravenous 60 min pre-op 11/27/20 0831 11/28/20 0840   11/28/20 0600  gentamicin (GARAMYCIN) 380 mg in dextrose 5 % 100 mL IVPB       "And" Linked Group Details   5 mg/kg  75.9 kg (Adjusted) 219 mL/hr over 30 Minutes Intravenous 60 min pre-op 11/27/20 0831 11/28/20 0853        Note: Portions of this report may have been transcribed using voice recognition software. Every effort was made to ensure accuracy; however, inadvertent computerized transcription errors may be present.   Any transcriptional errors that result from this process are unintentional.    Adin Hector, MD, FACS, MASCRS  Esophageal, Gastrointestinal & Colorectal Surgery Robotic and Minimally Invasive Surgery [facility] [address] [place], [place] [address] [phone]  Fax [phone] Main/Paging  CONTACT INFORMATION: Weekday (9AM-5PM) concerns: Call [facility] main office at [phone] Weeknight (5PM-9AM) or Weekend/Holiday concerns: Check [email or web address] for General Surgery [facility] coverage (Please, do not use SecureChat as it is not reliable communication to operating surgeons for immediate patient care)      11/29/2020  7:25 AM

## 2020-11-30 ENCOUNTER — Other Ambulatory Visit (HOSPITAL_COMMUNITY): Payer: Self-pay

## 2020-11-30 LAB — SURGICAL PATHOLOGY

## 2020-11-30 MED ORDER — CHEWING GUM (ORBIT) SUGAR FREE
1.0000 | CHEWING_GUM | Freq: Three times a day (TID) | ORAL | Status: DC
  Administered 2020-11-30 – 2020-12-01 (×2): 1 via ORAL
  Filled 2020-11-30: qty 1

## 2020-11-30 MED ORDER — LACTATED RINGERS IV SOLN: INTRAVENOUS | Status: DC

## 2020-11-30 NOTE — Progress Notes (Signed)
Subjective No acute events. Bloated with nausea, no emesis but some hiccups. Ambulated yesterday. Has had postop ileus before and this feels similar to him.  Objective: Vital signs in last 24 hours: Temp:  [98 F (36.7 C)-98.6 F (37 C)] 98.6 F (37 C) (05/13 0554) Pulse Rate:  [62-77] 72 (05/13 0554) Resp:  [18] 18 (05/13 0554) BP: (122-151)/(70-89) 151/89 (05/13 0554) SpO2:  [95 %-99 %] 95 % (05/13 0554) Weight:  [86.5 kg] 86.5 kg (05/13 0500) Last BM Date: 11/30/20  Intake/Output from previous day: 05/12 0701 - 05/13 0700 In: 0  Out: 875 [Urine:875] Intake/Output this shift: No intake/output data recorded.  Gen: NAD CV: RRR Pulm: Normal work of breathing Abd: Soft, moderately distended, not significantly tender. Incisions c/d/i without erythema nor drainage. No palpable hernias Ext: SCDs in place  Lab Results: CBC  Recent Labs    11/29/20 0427  WBC 11.1*  HGB 13.1  HCT 40.1  PLT 213   BMET Recent Labs    11/29/20 0427  NA 137  K 4.2  CL 104  CO2 27  GLUCOSE 124*  BUN 12  CREATININE 0.80  CALCIUM 8.7*   PT/INR No results for input(s): LABPROT, INR in the last 72 hours. ABG No results for input(s): PHART, HCO3 in the last 72 hours.  Invalid input(s): PCO2, PO2  Studies/Results:  Anti-infectives: Anti-infectives (From admission, onward)   Start     Dose/Rate Route Frequency Ordered Stop   11/28/20 1630  clindamycin (CLEOCIN) IVPB 900 mg        900 mg 100 mL/hr over 30 Minutes Intravenous Every 8 hours 11/28/20 1410 11/28/20 1812   11/28/20 1400  neomycin (MYCIFRADIN) tablet 1,000 mg  Status:  Discontinued       "And" Linked Group Details   1,000 mg Oral 3 times per day 11/28/20 0629 11/28/20 0637   11/28/20 1400  metroNIDAZOLE (FLAGYL) tablet 1,000 mg  Status:  Discontinued       "And" Linked Group Details   1,000 mg Oral 3 times per day 11/28/20 0629 11/28/20 0637   11/28/20 0600  clindamycin (CLEOCIN) IVPB 900 mg       "And" Linked Group  Details   900 mg 100 mL/hr over 30 Minutes Intravenous 60 min pre-op 11/27/20 0831 11/28/20 0840   11/28/20 0600  gentamicin (GARAMYCIN) 380 mg in dextrose 5 % 100 mL IVPB       "And" Linked Group Details   5 mg/kg  75.9 kg (Adjusted) 219 mL/hr over 30 Minutes Intravenous 60 min pre-op 11/27/20 0831 11/28/20 0853       Assessment/Plan: Patient Active Problem List   Diagnosis Date Noted  . 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 Low back pain 11/28/2020  . Pain in joint involving ankle and foot 11/28/2020  . Pain in joint involving ankle and foot 11/28/2020  . Prediabetes   . Asymmetrical sensorineural hearing loss   . Acute gangrenous appendicitis s/p lap appendectomy 09/28/2020 09/28/2020  . Hx of adenomatous polyp of colon 07/2016  . 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 Prostate cancer screening 02/02/2014  . Health maintenance examination 01/16/2014  . Melanoma of skin ([place]) 09/10/2011  . Hyperlipidemia 06/02/2011  . Metastatic malignant melanoma ([place]) 1989   s/p Procedure(s): ROBOTIC PROXIMAL COLECTOMY, LYSIS OF ADHESIONS X2 HOURS, PRIMARY REPAIR OF INCARCERATED INCISIONAL HERNIA X2, BILATERAL TAP BLOCK 11/28/2020  -Expected postoperative ileus -Gum chewing, ambulate 5x/day as able -Discussed npo, lr @ 75/hr -  Awaiting return of bowel fxn -PPx: Lovenox, SCDs -Path pending   LOS: 2 days   Nadeen Landau, MD [facility] Use [email or web address] to contact on call provider

## 2020-12-01 LAB — CBC WITH DIFFERENTIAL/PLATELET
Abs Immature Granulocytes: 0.02 10*3/uL (ref 0.00–0.07)
Basophils Absolute: 0 10*3/uL (ref 0.0–0.1)
Basophils Relative: 0 %
Eosinophils Absolute: 0.1 10*3/uL (ref 0.0–0.5)
Eosinophils Relative: 1 %
HCT: 42 % (ref 39.0–52.0)
Hemoglobin: 13.6 g/dL (ref 13.0–17.0)
Immature Granulocytes: 0 %
Lymphocytes Relative: 23 %
Lymphs Abs: 1.2 10*3/uL (ref 0.7–4.0)
MCH: 28.8 pg (ref 26.0–34.0)
MCHC: 32.4 g/dL (ref 30.0–36.0)
MCV: 89 fL (ref 80.0–100.0)
Monocytes Absolute: 0.6 10*3/uL (ref 0.1–1.0)
Monocytes Relative: 11 %
Neutro Abs: 3.4 10*3/uL (ref 1.7–7.7)
Neutrophils Relative %: 65 %
Platelets: 214 10*3/uL (ref 150–400)
RBC: 4.72 MIL/uL (ref 4.22–5.81)
RDW: 12.6 % (ref 11.5–15.5)
WBC: 5.4 10*3/uL (ref 4.0–10.5)
nRBC: 0 % (ref 0.0–0.2)

## 2020-12-01 LAB — BASIC METABOLIC PANEL
Anion gap: 11 (ref 5–15)
BUN: 18 mg/dL (ref 6–20)
CO2: 25 mmol/L (ref 22–32)
Calcium: 8.7 mg/dL — ABNORMAL LOW (ref 8.9–10.3)
Chloride: 103 mmol/L (ref 98–111)
Creatinine, Ser: 0.66 mg/dL (ref 0.61–1.24)
GFR, Estimated: >60 mL/min (ref >60)
Glucose, Bld: 102 mg/dL — ABNORMAL HIGH (ref 70–99)
Potassium: 3.9 mmol/L (ref 3.5–5.1)
Sodium: 139 mmol/L (ref 135–145)

## 2020-12-01 LAB — PHOSPHORUS: Phosphorus: 4.2 mg/dL (ref 2.5–4.6)

## 2020-12-01 LAB — MAGNESIUM: Magnesium: 2 mg/dL (ref 1.7–2.4)

## 2020-12-01 NOTE — Plan of Care (Signed)
  Problem: Skin Integrity: Goal: Demonstration of wound healing without infection will improve Outcome: Progressing   

## 2020-12-01 NOTE — Progress Notes (Signed)
3 Days Post-Op   Subjective/Chief Complaint: Seems to be feeling a little better. Passing flatus   Objective: Vital signs in last 24 hours: Temp:  [98.1 F (36.7 C)-98.6 F (37 C)] 98.6 F (37 C) (05/14 0557) Pulse Rate:  [76-104] 76 (05/14 0557) Resp:  [18] 18 (05/14 0557) BP: (133-136)/(87-98) 133/87 (05/14 0557) SpO2:  [97 %] 97 % (05/14 0557) Last BM Date: 11/30/20  Intake/Output from previous day: 05/13 0701 - 05/14 0700 In: 1475.9 [I.V.:1475.9] Out: -  Intake/Output this shift: No intake/output data recorded.  General appearance: alert and cooperative Resp: clear to auscultation bilaterally Cardio: regular rate and rhythm GI: soft, mild tenderness. incisions ok. few bs  Lab Results:  Recent Labs    11/29/20 0427  WBC 11.1*  HGB 13.1  HCT 40.1  PLT 213   BMET Recent Labs    11/29/20 0427  NA 137  K 4.2  CL 104  CO2 27  GLUCOSE 124*  BUN 12  CREATININE 0.80  CALCIUM 8.7*   PT/INR No results for input(s): LABPROT, INR in the last 72 hours. ABG No results for input(s): PHART, HCO3 in the last 72 hours.  Invalid input(s): PCO2, PO2  Studies/Results: No results found.  Anti-infectives: Anti-infectives (From admission, onward)   Start     Dose/Rate Route Frequency Ordered Stop   11/28/20 1630  clindamycin (CLEOCIN) IVPB 900 mg        900 mg 100 mL/hr over 30 Minutes Intravenous Every 8 hours 11/28/20 1410 11/28/20 1812   11/28/20 1400  neomycin (MYCIFRADIN) tablet 1,000 mg  Status:  Discontinued       "And" Linked Group Details   1,000 mg Oral 3 times per day 11/28/20 0629 11/28/20 0637   11/28/20 1400  metroNIDAZOLE (FLAGYL) tablet 1,000 mg  Status:  Discontinued       "And" Linked Group Details   1,000 mg Oral 3 times per day 11/28/20 0629 11/28/20 0637   11/28/20 0600  clindamycin (CLEOCIN) IVPB 900 mg       "And" Linked Group Details   900 mg 100 mL/hr over 30 Minutes Intravenous 60 min pre-op 11/27/20 0831 11/28/20 0840   11/28/20 0600   gentamicin (GARAMYCIN) 380 mg in dextrose 5 % 100 mL IVPB       "And" Linked Group Details   5 mg/kg  75.9 kg (Adjusted) 219 mL/hr over 30 Minutes Intravenous 60 min pre-op 11/27/20 0831 11/28/20 0853      Assessment/Plan: s/p Procedure(s): ROBOTIC PROXIMAL COLECTOMY, LYSIS OF ADHESIONS X2 HOURS, PRIMARY REPAIR OF INCARCERATED INCISIONAL HERNIA X2, BILATERAL TAP BLOCK (N/A) Advance diet. Start clears today Patient Active Problem List   Diagnosis Date Noted  . 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 Low back pain 11/28/2020  . Pain in joint involving ankle and foot 11/28/2020  . Pain in joint involving ankle and foot 11/28/2020  . Prediabetes   . Asymmetrical sensorineural hearing loss   . Acute gangrenous appendicitis s/p lap appendectomy 09/28/2020 09/28/2020  . Hx of adenomatous polyp of colon 07/2016  . 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 Prostate cancer screening 02/02/2014  . Health maintenance examination 01/16/2014  . Melanoma of skin ([place]) 09/10/2011  . Hyperlipidemia 06/02/2011  . Metastatic malignant melanoma ([place]) 1989   s/p Procedure(s): ROBOTIC PROXIMAL COLECTOMY, LYSIS OF ADHESIONS X2 HOURS, PRIMARY REPAIR OF INCARCERATED INCISIONAL HERNIA X2, BILATERAL TAP BLOCK 11/28/2020  -Expected postoperative ileus improving -Gum chewing, ambulate 5x/day as able -  Awaiting return of bowel fxn -PPx: Lovenox, SCDs -Path pending   LOS: 3 days    Autumn Messing III 12/01/2020

## 2020-12-02 NOTE — Progress Notes (Signed)
Pharmacy Brief Note - Alvimopan (Entereg)  The standing order set for alvimopan (Entereg) now includes an automatic order to discontinue the drug after the patient has had a bowel movement. The change was approved by the [place] and the Medical Executive Committee.   This patient has had bowel movements documented by nursing. Therefore, alvimopan has been discontinued. If there are questions, please contact the pharmacy at [phone].   Thank you-  Peggyann Juba, PharmD, BCPS 12/02/2020 10:17 AM

## 2020-12-02 NOTE — Progress Notes (Signed)
4 Days Post-Op   Subjective/Chief Complaint: Reports having lots of loose bm's   Objective: Vital signs in last 24 hours: Temp:  [98.2 F (36.8 C)-98.8 F (37.1 C)] 98.8 F (37.1 C) (05/15 0558) Pulse Rate:  [60-70] 65 (05/15 0558) Resp:  [15-18] 15 (05/15 0558) BP: (125-146)/(85-91) 136/85 (05/15 0558) SpO2:  [96 %-100 %] 96 % (05/15 0558) Last BM Date: 12/01/20  Intake/Output from previous day: 05/14 0701 - 05/15 0700 In: 2280 [P.O.:780; I.V.:1500] Out: 2 [Urine:2] Intake/Output this shift: No intake/output data recorded.  General appearance: alert and cooperative Resp: clear to auscultation bilaterally Cardio: regular rate and rhythm GI: soft, minimal tenderness. good bs  Lab Results:  Recent Labs    12/01/20 1606  WBC 5.4  HGB 13.6  HCT 42.0  PLT 214   BMET Recent Labs    12/01/20 1606  NA 139  K 3.9  CL 103  CO2 25  GLUCOSE 102*  BUN 18  CREATININE 0.66  CALCIUM 8.7*   PT/INR No results for input(s): LABPROT, INR in the last 72 hours. ABG No results for input(s): PHART, HCO3 in the last 72 hours.  Invalid input(s): PCO2, PO2  Studies/Results: No results found.  Anti-infectives: Anti-infectives (From admission, onward)   Start     Dose/Rate Route Frequency Ordered Stop   11/28/20 1630  clindamycin (CLEOCIN) IVPB 900 mg        900 mg 100 mL/hr over 30 Minutes Intravenous Every 8 hours 11/28/20 1410 11/28/20 1812   11/28/20 1400  neomycin (MYCIFRADIN) tablet 1,000 mg  Status:  Discontinued       "And" Linked Group Details   1,000 mg Oral 3 times per day 11/28/20 0629 11/28/20 0637   11/28/20 1400  metroNIDAZOLE (FLAGYL) tablet 1,000 mg  Status:  Discontinued       "And" Linked Group Details   1,000 mg Oral 3 times per day 11/28/20 0629 11/28/20 0637   11/28/20 0600  clindamycin (CLEOCIN) IVPB 900 mg       "And" Linked Group Details   900 mg 100 mL/hr over 30 Minutes Intravenous 60 min pre-op 11/27/20 0831 11/28/20 0840   11/28/20 0600   gentamicin (GARAMYCIN) 380 mg in dextrose 5 % 100 mL IVPB       "And" Linked Group Details   5 mg/kg  75.9 kg (Adjusted) 219 mL/hr over 30 Minutes Intravenous 60 min pre-op 11/27/20 0831 11/28/20 0853      Assessment/Plan: s/p Procedure(s): ROBOTIC PROXIMAL COLECTOMY, LYSIS OF ADHESIONS X2 HOURS, PRIMARY REPAIR OF INCARCERATED INCISIONAL HERNIA X2, BILATERAL TAP BLOCK (N/A) Advance diet  Patient Active Problem List   Diagnosis Date Noted  . 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 Low back pain 11/28/2020  . Pain in joint involving ankle and foot 11/28/2020  . Pain in joint involving ankle and foot 11/28/2020  . Prediabetes   . Asymmetrical sensorineural hearing loss   . Acute gangrenous appendicitis s/p lap appendectomy 09/28/2020 09/28/2020  . Hx of adenomatous polyp of colon 07/2016  . 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 Prostate cancer screening 02/02/2014  . Health maintenance examination 01/16/2014  . Melanoma of skin ([place]) 09/10/2011  . Hyperlipidemia 06/02/2011  . Metastatic malignant melanoma ([place]) 1989   s/p Procedure(s): ROBOTIC PROXIMAL COLECTOMY, LYSIS OF ADHESIONS X2 HOURS, PRIMARY REPAIR OF INCARCERATED INCISIONAL HERNIA X2, BILATERAL TAP BLOCK 11/28/2020  -Expected postoperative ileus improving -Gum chewing, ambulate 5x/day as able -Awaiting return of bowel fxn -  PPx: Lovenox, SCDs -Path pending    LOS: 4 days    Autumn Messing III 12/02/2020

## 2020-12-03 ENCOUNTER — Other Ambulatory Visit (HOSPITAL_COMMUNITY): Payer: Self-pay

## 2020-12-03 ENCOUNTER — Encounter: Payer: Self-pay | Admitting: Family Medicine

## 2020-12-03 MED ORDER — TRAMADOL HCL 50 MG PO TABS: 50.0000 mg | ORAL_TABLET | Freq: Four times a day (QID) | ORAL | 0 refills | Status: DC | PRN

## 2020-12-03 NOTE — Discharge Summary (Signed)
Physician Discharge Summary    Patient ID: Sean [name] MRN: [id number] DOB/AGE: 1961/02/16  60 y.o.  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 Active Problems:   Metastatic malignant melanoma ([place])   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  Rather torturous bowel with internal incarcerated hernias and partial obstruction points.  Inflammation and scarring in the right lower quadrant retroperitoneum but all soft without any obvious ulcerative nodularity. No obvious metastatic disease on visceral parietal peritoneum or liver.   It is an isoperistaltic ileocolonic anastomosis that rests in the epigastric region  CASE DATA:  Type of patient?: Elective [facility] Private Case  Status of Case? Elective Scheduled  Infection Present At  Time Of Surgery (PATOS)?  NO  Consults: None  Hospital Course:   The patient underwent the surgery above.  Postoperatively, the patient gradually mobilized and advanced to a solid diet.  Pain and other symptoms were treated aggressively.    By the time of discharge, the patient was walking well the hallways, eating food, having flatus.  Pain was well-controlled on an oral medications.  Based on meeting discharge criteria and continuing to recover, I felt it was safe for the patient to be discharged from the hospital to further recover with close followup. Postoperative recommendations were discussed in detail.  They are written as well.  Discharged Condition: good  Discharge Exam: Blood pressure 139/85, pulse 61, temperature 98.4 F (36.9 C), temperature source Oral, resp. rate 16, height _0  (1.727 m), weight 85.1 kg, SpO2 96 %.  General: Pt awake/alert/oriented x4 in No acute distress Eyes: PERRL, normal EOM.  Sclera clear.  No icterus Neuro: CN II-XII intact w/o focal sensory/motor deficits. Lymph: No head/neck/groin lymphadenopathy Psych:  No delerium/psychosis/paranoia HENT: Normocephalic, Mucus membranes moist.  No thrush Neck: Supple, No tracheal deviation Chest: No chest wall pain w good excursion CV:  Pulses intact.  Regular rhythm MS: Normal AROM mjr joints.  No obvious deformity Abdomen: Soft.  Mildly distended.  Mildly tender at incisions only.  No evidence of peritonitis.  No incarcerated hernias. Ext:  SCDs BLE.  No mjr edema.  No cyanosis Skin: No petechiae / purpura   PATHOLOGY:  SURGICAL PATHOLOGY  CASE: [id number]  PATIENT: Blima Dessert  Surgical Pathology Report      Clinical History: Cancer of appendix; cecal cancer and partial small  bowel obstruction (crm)      FINAL MICROSCOPIC DIAGNOSIS:   A. COLON, RIGHT, RESECTION:  - Inflammatory changes and fibrosis  consistent with previous  appendectomy.  - No residual goblet cell carcinoid  identified.  - Final margins negative for tumor.  - Twenty three lymph nodes negative for metastatic tumor (0/23).  - See comment.   COMMENT:  There is focal inflammation with fat necrosis, giant cell reaction and  fibrosis consistent with previous appendectomy. The entire appendiceal  stump is examined histologically and no residual goblet cell carcinoid  is identified. Immunohistochemistry for cytokeratin AE1/AE3 supports  the diagnosis. The updated TNM is pT3, pN0    Alyna Stensland DESCRIPTION:   Specimen: Received fresh labeled right colon  Specimen integrity: Intact, with 2 stapled resection margins  Specimen length: There is 8.5 cm of terminal ileum and 45.0 cm of colon  Mesorectal intactness: Not applicable  Tumor location: The presumed tumor is within the appendix  Tumor size: Identified at the cecum is a portion of appendix, measuring  4.4 cm in length by 0.7 cm in diameter. The end of the appendix  displays embedded staples, grossly consistent with previous appendectomy  site. Sectioning the appendix reveals a tan-pink mucosa, with a  minimally dilated lumen. No distinct mass is grossly identified.  Percent of bowel circumference involved: Not applicable  Tumor distance to margins:            Proximal: Approximately 10.5 cm from the proximal  margin to the appendiceal orifice            Distal: 45.0 cm            Mesenteric (sigmoid and transverse): Nonapplicable            Radial (posterior ascending, posterior descending;  lateral and posterior mid-rectum; and entire lower 1/3 rectum):  Nonapplicable  Macroscopic extent of tumor invasion:Not grossly identified  Total presumed lymph nodes: 10 tan-red to gray possible lymph nodes  identified, ranging from 0.1 cm to 1.0 x 0.8 x 0.4 cm. Remaining fat is  placed in clearing solution for additional possible lymph node  identification. Following clearing 19 additional possible  lymph nodes  are identified, ranging from 0.1 to 0.3 cm in greatest dimension.  Extramural satellite tumor nodules: None identified  Mucosal polyp(s): No polyps are grossly identified. Approximately 5  tan-red, hyperemic punctate areas are noted along the colon, grossly  suggestive of previous biopsy sites.  Additional findings: The uninvolved mucosa is tan-pink with normal  folding. Identified within the adipose tissue near the cecum there is a  1.0 cm in greatest dimension yellow, indurated area identified.  Block summary:  1 = proximal margin  2 and 3 = distal margins  4 and 5 = possible biopsy sites  6 = indurated adipose tissue  7-10 = appendix, entirely submitted from distal to proximal  11 =4 possible lymph nodes  12 = 3 possible lymph nodes  13 = 2 possible lymph nodes  14 = 1 possible lymph node, bisected (KL 11/28/2020)  15-17 = 5 additional possible lymph nodes  18 = 4 additional possible lymph nodes Craig Staggers 11/29/2020)   Final Diagnosis performed by Claudette Laws, MD.  Electronically signed  11/30/2020  Technical component performed at [facility], [place]  [address] [place], [place] [address].  Professional component performed at [employer]. [facility],  [place] [address] [place], [place] [address].  Immunohistochemistry Technical component (if applicable) was performed  at [facility]. [address] [place],  [place], [place] [address].  IMMUNOHISTOCHEMISTRY DISCLAIMER (if applicable):  Some of these immunohistochemical stains may have  been developed and the  performance characteristics determine by [facility]. Some  may not have been cleared or approved by the U.S. Food and Drug  Administration. The FDA has determined that such clearance or approval  is not necessary. This test is used for clinical purposes. It should not  be regarded as investigational or for research. This laboratory is  certified under the  [place]  (CLIA-88) as qualified to perform high complexity clinical laboratory  testing. The controls stained appropriately.     Disposition:    Follow-up Information    Michael Boston, MD. Schedule an appointment as soon as possible for a visit in 3 weeks.   Specialties: General Surgery, Colon and Rectal Surgery Why: To follow up after your operation Contact information: [place] [address] [phone]               Discharge disposition: 01-Home or Self Care       Discharge Instructions    Call MD for:   Complete by: As directed    FEVER > 101.5 F  (temperatures < 101.5 F are not significant)   Call MD for:  extreme fatigue   Complete by: As directed    Call MD for:  persistant dizziness or light-headedness   Complete by: As directed    Call MD for:  persistant nausea and vomiting   Complete by: As directed    Call MD for:  redness, tenderness, or signs of infection (pain, swelling, redness, odor or green/yellow discharge around incision site)   Complete by: As directed    Call MD for:  severe uncontrolled pain   Complete by: As directed    Diet - low sodium heart healthy   Complete by: As directed    Start with a bland diet such as soups, liquids, starchy foods, low fat foods, etc. the first few days at home. Gradually advance to a solid, low-fat, high fiber diet by the end of the first week at home.   Add a fiber supplement to your diet (Metamucil, etc) If you feel full, bloated, or constipated, stay on a full liquid or pureed/blenderized diet for a few days until you feel better and are no longer constipated.   Discharge instructions   Complete by: As directed    See Discharge Instructions If you are not getting better after two weeks or are noticing you are getting worse, contact our office [phone] for further advice.  We may need to adjust your medications, re-evaluate you in  the office, send you to the emergency room, or see what other things we can do to help. The clinic staff is available to answer your questions during regular business hours (8:30am-5pm).  Please don't hesitate to call and ask to speak to one of our nurses for clinical concerns.    A surgeon from [facility] is always on call at the hospitals 24 hours/day If you have a medical emergency, go to the nearest emergency room or call 911.   Discharge wound care:   Complete by: As directed    It is good for closed incisions and even open wounds to be washed every day.  Shower every day.  Short baths are fine.  Wash the incisions and wounds clean with soap & water.     You may leave closed incisions open to air if it is dry.   You may cover the incision with clean  gauze & replace it after your daily shower for comfort.   Driving Restrictions   Complete by: As directed    You may drive when: - you are no longer taking narcotic prescription pain medication - you can comfortably wear a seatbelt - you can safely make sudden turns/stops without pain.   Increase activity slowly   Complete by: As directed    Start light daily activities --- self-care, walking, climbing stairs- beginning the day after surgery.  Gradually increase activities as tolerated.  Control your pain to be active.  Stop when you are tired.  Ideally, walk several times a day, eventually an hour a day.   Most people are back to most day-to-day activities in a few weeks.  It takes 4-6 weeks to get back to unrestricted, intense activity. If you can walk 30 minutes without difficulty, it is safe to try more intense activity such as jogging, treadmill, bicycling, low-impact aerobics, swimming, etc. Save the most intensive and strenuous activity for last (Usually 4-8 weeks after surgery) such as sit-ups, heavy lifting, contact sports, etc.  Refrain from any intense heavy lifting or straining until you are off narcotics for pain  control.  You will have off days, but things should improve week-by-week. DO NOT PUSH THROUGH PAIN.  Let pain be your guide: If it hurts to do something, don't do it.   Lifting restrictions   Complete by: As directed    If you can walk 30 minutes without difficulty, it is safe to try more intense activity such as jogging, treadmill, bicycling, low-impact aerobics, swimming, etc. Save the most intensive and strenuous activity for last (Usually 4-8 weeks after surgery) such as sit-ups, heavy lifting, contact sports, etc.   Refrain from any intense heavy lifting or straining until you are off narcotics for pain control.  You will have off days, but things should improve week-by-week. DO NOT PUSH THROUGH PAIN.  Let pain be your guide: If it hurts to do something, don't do it.  Pain is your body warning you to avoid that activity for another week until the pain goes down.   May shower / Bathe   Complete by: As directed    May walk up steps   Complete by: As directed    Sexual Activity Restrictions   Complete by: As directed    You may have sexual intercourse when it is comfortable. If it hurts to do something, stop.      Allergies as of 12/03/2020      Reactions   Aspirin Nausea And Vomiting   Penicillins Other (See Comments)   Childhood Has patient had a PCN reaction causing immediate rash, facial/tongue/throat swelling, SOB or lightheadedness with hypotension: unknown Has patient had a PCN reaction causing severe rash involving mucus membranes or skin necrosis: {unkjnown Has patient had a PCN reaction that required hospitalization {no Has patient had a PCN reaction occurring within the last 10 years: no If all of the above answers are "NO", then may proceed with Cephalosporin use.      Medication List    STOP taking these medications   oxyCODONE 5 MG immediate release tablet Commonly known as: Roxicodone     TAKE these medications   acetaminophen 500 MG tablet Commonly known as:  TYLENOL Take 2 tablets (1,000 mg total) by mouth every 6 (six) hours as needed.   traMADol 50 MG tablet Commonly known as: ULTRAM Take 1-2 tablets (50-100 mg total) by mouth every 6 (six) hours as needed  for moderate pain or severe pain.   Vitamin D3 50 MCG (2000 UT) Tabs Take 2,000 Units by mouth in the morning.   zolpidem 10 MG tablet Commonly known as: AMBIEN Take 1 tablet (10 mg total) by mouth at bedtime as needed for sleep.            Discharge Care Instructions  (From admission, onward)         Start     Ordered   12/03/20 0000  Discharge wound care:       Comments: It is good for closed incisions and even open wounds to be washed every day.  Shower every day.  Short baths are fine.  Wash the incisions and wounds clean with soap & water.     You may leave closed incisions open to air if it is dry.   You may cover the incision with clean gauze & replace it after your daily shower for comfort.   12/03/20 0903          Significant Diagnostic Studies:  Results for orders placed or performed during the hospital encounter of 11/28/20 (from the past 72 hour(s))  CBC with Differential/Platelet     Status: None   Collection Time: 12/01/20  4:06 PM  Result Value Ref Range   WBC 5.4 4.0 - 10.5 K/uL   RBC 4.72 4.22 - 5.81 MIL/uL   Hemoglobin 13.6 13.0 - 17.0 g/dL   HCT 42.0 39.0 - 52.0 %   MCV 89.0 80.0 - 100.0 fL   MCH 28.8 26.0 - 34.0 pg   MCHC 32.4 30.0 - 36.0 g/dL   RDW 12.6 11.5 - 15.5 %   Platelets 214 150 - 400 K/uL   nRBC 0.0 0.0 - 0.2 %   Neutrophils Relative % 65 %   Neutro Abs 3.4 1.7 - 7.7 K/uL   Lymphocytes Relative 23 %   Lymphs Abs 1.2 0.7 - 4.0 K/uL   Monocytes Relative 11 %   Monocytes Absolute 0.6 0.1 - 1.0 K/uL   Eosinophils Relative 1 %   Eosinophils Absolute 0.1 0.0 - 0.5 K/uL   Basophils Relative 0 %   Basophils Absolute 0.0 0.0 - 0.1 K/uL   Immature Granulocytes 0 %   Abs Immature Granulocytes 0.02 0.00 - 0.07 K/uL    Comment: Performed  at [facility], [place] [address], [place], [place] [address]  Basic metabolic panel     Status: Abnormal   Collection Time: 12/01/20  4:06 PM  Result Value Ref Range   Sodium 139 135 - 145 mmol/L   Potassium 3.9 3.5 - 5.1 mmol/L   Chloride 103 98 - 111 mmol/L   CO2 25 22 - 32 mmol/L   Glucose, Bld 102 (H) 70 - 99 mg/dL    Comment: Glucose reference range applies only to samples taken after fasting for at least 8 hours.   BUN 18 6 - 20 mg/dL   Creatinine, Ser 0.66 0.61 - 1.24 mg/dL   Calcium 8.7 (L) 8.9 - 10.3 mg/dL   GFR, Estimated >60 >60 mL/min    Comment: (NOTE) Calculated using the CKD-EPI Creatinine Equation (2021)    Anion gap 11 5 - 15    Comment: Performed at [facility], [place] [address], [place], [place] [address]  Magnesium     Status: None   Collection Time: 12/01/20  4:06 PM  Result Value Ref Range   Magnesium 2.0 1.7 - 2.4 mg/dL    Comment: Performed  at [facility], [place] [address] [place], [place] [address]  Phosphorus     Status: None   Collection Time: 12/01/20  4:06 PM  Result Value Ref Range   Phosphorus 4.2 2.5 - 4.6 mg/dL    Comment: Performed at [facility], [place] [address], [place], [place] [address]    No results found.  Past Medical History:  Diagnosis Date  . Arthritis    lower back  . Asymmetrical sensorineural hearing loss    L>>R: ENT eval 04/2018.  Pt declined MRI brain at that time.  . 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  CT chest neg for metastatic dz, CEA neg.  Marland Kitchen History of adenomatous polyp of colon 07/2016  . History of vitamin D deficiency    still present as of 09/2017.  High dose vit D started 10/16/17.  Increased dose to 50K twice per week 02/18/18.  Marland Kitchen Hyperlipidemia    Mild, never meds  . Metastatic malignant melanoma ([place]) 1989    Recurrence L upper arm 1993.  Then left back and LUL lung recurrence 1995-resected.  Brain lesion recurrence 1996-resected.  RLL lung lesion 1996--chemo & then resection.  Small bowel recurrence resected 1997, then mesenteric lesion resected 1998.  No sign of recurrence since 1998.  Released by onc/Dr. Magrinot as of 12/2015 (needs annual dermatologist exam and annual ophth exam).  . Pre-diabetes   . Prediabetes    A1c 5.9% 12/2014.  6.2% 09/2017. 6.3% 04/2019.    Past Surgical History:  Procedure Laterality Date  . APPENDECTOMY    . BRAIN SURGERY     Removal of melanoma met  . COLONOSCOPY  2009; 07/2016   Dr. Wynetta Emery at [facility]; next colonoscopy due 07/2019.  Marland Kitchen COLONOSCOPY WITH PROPOFOL N/A 08/18/2016   Tubular adenoma.  Recall 3 yrs.  Procedure: COLONOSCOPY WITH PROPOFOL;  Surgeon: Garlan Fair, MD;  Location: [facility] END[facility]COPY;  Service: Endoscopy;  Laterality: N/A;  . IR SINUS/FIST TUBE CHK-NON GI  10/08/2020  . LAPAROSCOPIC APPENDECTOMY N/A 09/28/2020   Procedure: APPENDECTOMY LAPAROSCOPIC;  Surgeon: Erroll Luna, MD;  Location: [facility] ORS;  Service: General;  Laterality: N/A;  . LUNG SURGERY     "              "          "        "  . [place]   x2 from melanoma    Social History   Socioeconomic History  . Marital status: Single    Spouse name: Not on file  . Number of children: 2  . Years of education: Not on file  . Highest education level: Not on file  Occupational History  . Not on file  Tobacco Use  . Smoking status: Never Smoker  . Smokeless tobacco: Former Network engineer  . Vaping Use: Never used  Substance and Sexual Activity  . Alcohol use: Not Currently  . Drug use: No  . Sexual activity: Yes  Other Topics Concern  . Not on file  Social History Narrative   Married, 2 sons.   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   No T/A/Ds.   Diet ok except fast foods.   Social Determinants of Health   Financial Resource Strain: Not on  file  Food Insecurity: Not on file  Transportation Needs: Not on file  Physical Activity: Not on file  Stress: Not on file  Social Connections: Not on file  Intimate Partner Violence: Not on file    Family History  Problem Relation Age of Onset  . Cancer Mother        breast  . Alcohol abuse Father   . Colon cancer Neg Hx   . Esophageal cancer Neg Hx   . Stomach cancer Neg Hx   . Rectal cancer Neg Hx     Current Facility-Administered Medications  Medication Dose Route Frequency Provider Last Rate Last Admin  . acetaminophen (TYLENOL) tablet 1,000 mg  1,000 mg Oral Lajuana Ripple, MD   1,000 mg at 12/03/20 0615  . alum & mag hydroxide-simeth (MAALOX/MYLANTA) 200-200-20 MG/5ML suspension 30 mL  30 mL Oral Q6H PRN Michael Boston, MD      . cholecalciferol (VITAMIN D3) tablet 2,000 Units  2,000 Units Oral Daily Michael Boston, MD   2,000 Units at 12/02/20 1011  . diphenhydrAMINE (BENADRYL) 12.5 MG/5ML elixir 12.5 mg  12.5 mg Oral Q6H PRN Michael Boston, MD       Or  . diphenhydrAMINE (BENADRYL) injection 12.5 mg  12.5 mg Intravenous Q6H PRN Michael Boston, MD      . enoxaparin (LOVENOX) injection 40 mg  40 mg Subcutaneous Q24H Michael Boston, MD      . feeding supplement (ENSURE SURGERY) liquid 237 mL  237 mL Oral BID BM Michael Boston, MD   237 mL at 11/29/20 1326  . lactated ringers infusion   Intravenous Continuous Ileana Roup, MD 75 mL/hr at 12/01/20 1800 Infusion Verify at 12/01/20 1800  . lip balm (CARMEX) ointment 1 application  1 application Topical BID Michael Boston, MD   1 application at 11/91/47 2143  . magic mouthwash  15 mL Oral QID PRN Michael Boston, MD      . melatonin tablet 3 mg  3 mg Oral QHS PRN Michael Boston, MD   3 mg at 12/02/20 2142  . methocarbamol (ROBAXIN) 1,000 mg in dextrose 5 % 100 mL IVPB  1,000 mg Intravenous Q6H PRN Michael Boston, MD      . methocarbamol (ROBAXIN) tablet 1,000 mg  1,000 mg Oral Q6H PRN Michael Boston, MD   1,000 mg at 12/02/20 2142   . metoprolol tartrate (LOPRESSOR) injection 5 mg  5 mg Intravenous Q6H PRN Michael Boston, MD      . morphine 2 MG/ML injection 2-4 mg  2-4 mg Intravenous Q4H PRN Michael Boston, MD   2 mg at 11/28/20 2220  . ondansetron (ZOFRAN) tablet 4 mg  4 mg Oral Q6H PRN Michael Boston, MD   4 mg at 11/30/20 0734   Or  . ondansetron (ZOFRAN) injection 4 mg  4 mg Intravenous Q6H PRN Michael Boston, MD   4 mg at 11/30/20 1235  . polycarbophil (FIBERCON) tablet 625 mg  625 mg Oral BID Michael Boston, MD   625 mg at 12/02/20 2140  . prochlorperazine (COMPAZINE) tablet 10 mg  10 mg Oral Q6H PRN Michael Boston, MD       Or  . prochlorperazine (COMPAZINE) injection 5-10 mg  5-10 mg Intravenous Q6H PRN Michael Boston, MD      . simethicone (MYLICON) chewable tablet 40 mg  40 mg Oral Q6H PRN Michael Boston, MD   40 mg at 12/02/20 0330  . traMADol (ULTRAM) tablet 50-100 mg  50-100 mg Oral Q6H PRN Michael Boston, MD   100 mg at  11/30/20 0410     Allergies  Allergen Reactions  . Aspirin Nausea And Vomiting  . Penicillins Other (See Comments)    Childhood  Has patient had a PCN reaction causing immediate rash, facial/tongue/throat swelling, SOB or lightheadedness with hypotension: unknown Has patient had a PCN reaction causing severe rash involving mucus membranes or skin necrosis: {unkjnown Has patient had a PCN reaction that required hospitalization {no Has patient had a PCN reaction occurring within the last 10 years: no If all of the above answers are "NO", then may proceed with Cephalosporin use.    Signed: Morton Peters, MD, FACS, MASCRS  Esophageal, Gastrointestinal & Colorectal Surgery Robotic and Minimally Invasive Surgery [facility] [address] [place], [place] [address] [phone] Fax [phone] Main/Paging  CONTACT INFORMATION: Weekday (9AM-5PM) concerns: Call [facility] main office at [phone] Weeknight (5PM-9AM) or Weekend/Holiday  concerns: Check [email or web address] for General Surgery [facility] coverage (Please, do not use SecureChat as it is not reliable communication to operating surgeons for immediate patient care)      12/03/2020, 9:04 AM

## 2020-12-07 ENCOUNTER — Encounter: Payer: Self-pay | Admitting: Gastroenterology

## 2020-12-11 ENCOUNTER — Encounter: Payer: Self-pay | Admitting: *Deleted

## 2020-12-11 ENCOUNTER — Telehealth: Payer: Self-pay | Admitting: *Deleted

## 2020-12-11 NOTE — Telephone Encounter (Addendum)
Sean [name] is a 60 year old divorced male with two grown sons. He lives alone in [place] and is still on STD leave due to colectomy/hernia repair surgery on 11/28/20 (will be out till late June per Sean [name]). He is a full time driver for [employer]. Also reports that prior to surgery he enjoyed walking for exercise and weight lifting. He has never smoked cigarettes and drinks alcohol ~ 1 drink every 2 weeks. Reports feeling well since surgery and no longer needs pain medication. He reports ~ 15 lb weight loss since before his appendectomy 09/28/20. He had melanoma in 1989 w/metastatic recurrence removed from lungs, colon and brain between 1996-1998 and Sean [name] was oncologist. No recurrence since 1998. He declines COVID vaccine. Denies any barriers to care or cultural considerations that would affect his care. He does not have a Soil scientist. Provided directions to [facility], parking,  and need for masking. He is allowed one visitor for the appointment. Explained what will occur during the 1 hour visit with Sean [name]. He had no questions. Will arrive 15 minutes early.

## 2020-12-12 ENCOUNTER — Other Ambulatory Visit: Payer: Self-pay

## 2020-12-12 ENCOUNTER — Inpatient Hospital Stay: Payer: 59 | Attending: Oncology | Admitting: Oncology

## 2020-12-12 VITALS — BP 124/78 | HR 74 | Temp 98.3°F | Resp 19 | Ht 68.0 in | Wt 184.8 lb

## 2020-12-12 DIAGNOSIS — C180 Malignant neoplasm of cecum: Secondary | ICD-10-CM | POA: Diagnosis not present

## 2020-12-12 NOTE — Progress Notes (Signed)
[facility] New Patient Consult   Requesting MD: Tammi Sou, Md [address],  [place] [address]   Sean [name] 60 y.o.  [birth year]    Reason for Consult: Adenocarcinoma the appendix   HPI: Sean [name] reports feeling completely well prior to the acute onset of right lower abdominal pain on 09/28/2020.  He presented to the emergency room and a CT of the abdomen/pelvis revealed inflammatory stranding at the terminal ileum and cecum.  Descending and sigmoid colonic diverticulosis.  Mucosal hyperenhancement of the appendix.  No enlarged abdominal pelvic lymph nodes.  No drainable fluid collection.  He was felt to have acute uncomplicated appendicitis.  Sean [name] was consulted and he was taken the operating room on 09/28/2020 for a laparoscopic appendectomy.  Severe inflammation was noted in the right lower quadrant.  There was a retrocecal appendix with a mid appendiceal perforation.  No abscess.  An appendectomy was performed.  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  Tumor focally invaded through the muscularis propria.  The proximal inked resection margin was positive.  The tumor was estimated to measure at least 2.4 cm.  The tumor was positive for CDX2, mucicarmine, and synaptophysin.  No lymphovascular invasion or tumor deposits. He developed a postoperative abscess in the appendectomy bed in addition to fluid collections in the midline pelvis, and periumbilical area.  He had a postoperative ileus.  The right lower quadrant fluid collection was drained by interventional radiology.  Cytology from the fluid collection returned negative for malignant cells.  A follow-up CT 10/08/2020 revealed resolution of the abscess collection.  He was referred to Dr. Johney [name] for a right colectomy.  He underwent a colonoscopy on 11/27/2020.  Polyps were removed from the sigmoid, descending, and transverse colon.  The appendiceal orifice  appeared normal.  The pathology revealed tubular adenomas without high-grade dysplasia or malignancy and sessile serrated polyps.  There were also hyperplastic polyps.  He was taken to the operating room by Dr. Johney [name] for a right colectomy on 11/28/2020.  There was no evidence of metastatic disease involving the peritoneum or liver.  Incarcerated incisional hernias were repaired. The pathology revealed no residual goblet cell carcinoma.  23 lymph nodes were negative for metastatic tumor.  Sean [name] has recovered from surgery.  He is referred for oncology evaluation.     Past Medical History:  Diagnosis Date  . Arthritis    lower back  . Asymmetrical sensorineural hearing loss    L>>R: ENT eval 04/2018.  Pt declined MRI brain at that time.  . Colon adenocarcinoma ([facility]) 1995   goblet cell adenocarcinoma of colon, dx'd when he got acute appendicitis w/perf-->path showed adenocarcinoma with positive margins.  CT chest neg for metastatic dz, CEA neg. R hemicolectomy with iliocolonic anastomosis 11/2020.  Marland Kitchen History of adenomatous polyp of colon 07/2016  . History of vitamin D deficiency    still present as of 09/2017.  High dose vit D started 10/16/17.  Increased dose to 50K twice per week 02/18/18.  Marland Kitchen Hyperlipidemia    Mild, never meds  . Metastatic malignant melanoma ([facility]) 1989   Recurrence L upper arm 1993.  Then left back and LUL lung recurrence 1995-resected.  Brain lesion recurrence 1996-resected.  RLL lung lesion 1996--chemo & then resection.  Small bowel recurrence resected 1997, then mesenteric lesion resected 1998.  No sign of recurrence since 1998.  Released by onc/Dr. Magrinot as of 12/2015 (needs annual dermatologist  exam and annual ophth exam).  . Pre-diabetes   . Prediabetes    A1c 5.9% 12/2014.  6.2% 09/2017. 6.3% 04/2019.  Marland Kitchen Skin cancer    melanoma    Past Surgical History:  Procedure Laterality Date  . APPENDECTOMY    . BRAIN SURGERY     Removal of melanoma met  . COLON  SURGERY  11/2020   For colon ca resection.  iliocolonic anastomosis.  . COLONOSCOPY  2009; 07/2016   Dr. Wynetta Emery at [facility]; next colonoscopy due 07/2019.  Marland Kitchen COLONOSCOPY WITH PROPOFOL N/A 08/18/2016   Tubular adenoma.  Recall 3 yrs.  Procedure: COLONOSCOPY WITH PROPOFOL;  Surgeon: Garlan Fair, MD;  Location: [facility] ENDOSCOPY;  Service: Endoscopy;  Laterality: N/A;  . IR SINUS/FIST TUBE CHK-NON GI  10/08/2020  . LAPAROSCOPIC APPENDECTOMY N/A 09/28/2020   Procedure: APPENDECTOMY LAPAROSCOPIC;  Surgeon: Erroll Luna, MD;  Location: [facility] ORS;  Service: General;  Laterality: N/A;  . LUNG SURGERY     "              "          "        "  . [place]   x2 from melanoma    Medications: Reviewed  Allergies:  Allergies  Allergen Reactions  . Aspirin Nausea And Vomiting  . Penicillins Other (See Comments)    Childhood  Has patient had a PCN reaction causing immediate rash, facial/tongue/throat swelling, SOB or lightheadedness with hypotension: unknown Has patient had a PCN reaction causing severe rash involving mucus membranes or skin necrosis: {unkjnown Has patient had a PCN reaction that required hospitalization {no Has patient had a PCN reaction occurring within the last 10 years: no If all of the above answers are "NO", then may proceed with Cephalosporin use.    Family history: His mother died of breast cancer in her 60s  Social History:   He lives alone in [place].  He works for [employer].  He does not use cigarettes or alcohol.  He received a transfusion following lung surgery, no risk factor for HIV or hepatitis.  He has not received a COVID-19 vaccine  ROS:   Positives include: Acute onset right lower quadrant pain 09/28/2020, "abscess "after appendectomy March 22, bruise at the left abdomen and flank following surgery  A complete ROS was otherwise negative.  Physical Exam:  Blood pressure 124/78, pulse 74, temperature 98.3 F (36.8 C), temperature  source Oral, resp. rate 19, height _0  (1.727 m), weight 184 lb 12.8 oz (83.8 kg), SpO2 99 %.  HEENT: Neck without mass Lungs: Clear bilaterally Cardiac: Regular rate and rhythm Abdomen: Healed surgical incisions, no hepatosplenomegaly, no mass GU: Testes without mass Vascular: No leg edema Lymph nodes: No cervical, supraclavicular, axillary, or inguinal nodes Neurologic: Alert and oriented, the motor exam appears intact in the upper and lower extremities bilaterally Skin: Multiple scars over the chest, back, left arm without evidence of recurrent tumor Musculoskeletal: No spine tenderness   LAB:  CBC  Lab Results  Component Value Date   WBC 5.4 12/01/2020   HGB 13.6 12/01/2020   HCT 42.0 12/01/2020   MCV 89.0 12/01/2020   PLT 214 12/01/2020   NEUTROABS 3.4 12/01/2020        CMP  Lab Results  Component Value Date   NA 139 12/01/2020   K 3.9 12/01/2020   CL 103 12/01/2020   CO2 25 12/01/2020   GLUCOSE 102 (H)  12/01/2020   BUN 18 12/01/2020   CREATININE 0.66 12/01/2020   CALCIUM 8.7 (L) 12/01/2020   PROT 6.2 (L) 10/08/2020   ALBUMIN 3.0 (L) 10/08/2020   AST 22 10/08/2020   ALT 43 10/08/2020   ALKPHOS 52 10/08/2020   BILITOT 0.9 10/08/2020   GFRNONAA >60 12/01/2020     Lab Results  Component Value Date   CEA1 1.6 10/04/2020    Imaging: As per HPI   Assessment/Plan:   1. Goblet cell adenocarcinoma of appendix, status post an appendectomy 09/28/2020  Low-grade goblet cell adenocarcinoma with tumor invading through the muscularis propria, pT3, no lymphovascular invasion.  Grade 1.  Proximal resection margin positive.  Focal disruption in the mid segment noted  CT abdomen/pelvis 3/56/7014- acute uncomplicated appendicitis  CT chest 10/03/2020- no suspicious pulmonary nodules, no evidence of metastatic disease  Colonoscopy 11/27/2020-normal appendiceal orifice, multiple polyps removed from the colon  Right colectomy 11/28/2020- no evidence of metastatic  disease, no residual goblet cell carcinoma, 23 negative lymph nodes 2. Post appendectomy fluid collection- drained by interventional radiology 10/03/2020, negative cytology 3. Colonoscopy 11/27/2020, sessile serrated polyps, tubular adenomas, and hyperplastic polyps 4. Melanoma, left arm November 1989, Clark's level 3, Breslow depth 1.0  Recurrent left upper arm February 1993-excised  Recurrence left back, left lower lung (removed in 1995), solitary brain lesion resected in September 1996, status post treatment with the "[place] "regimen, measurable disease in the right lung-chemotherapy ineffective, right lung lesion removed November 1996  Resection of small bowel lesion in February 1997 and a mesenteric lesion in 1998   Disposition:   Sean [name] has a remote history of metastatic melanoma with multiple recurrences.  He has been in clinical remission for over 20 years.  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  There is no clinical or radiologic evidence of distant metastatic disease.  He underwent a completion right colectomy with no residual tumor found and 23 negative lymph nodes.  There appendix was perforated.  This was the only high risk feature associated with his tumor.  I discussed the diagnosis and reviewed details of the surgical pathology report with Sean [name].  He has a good prognosis for a long-term disease-free survival, though we should consider adjuvant systemic therapy based on the tumor perforation.  We discussed chemotherapy.  There is a lack of extensive data to direct adjuvant therapy in patients with appendix carcinoma, but we generally extrapolate from the colorectal literature.  I will present his case at the GI tumor conference within the next few weeks.  I will ask him to return for further discussion in 2-3 weeks.    Betsy Coder, MD  12/12/2020, 1:54 PM

## 2020-12-19 ENCOUNTER — Inpatient Hospital Stay: Payer: 59 | Attending: Oncology | Admitting: Oncology

## 2020-12-19 ENCOUNTER — Other Ambulatory Visit: Payer: Self-pay

## 2020-12-19 VITALS — BP 134/79 | HR 56 | Temp 98.9°F | Resp 20 | Wt 187.4 lb

## 2020-12-19 DIAGNOSIS — C181 Malignant neoplasm of appendix: Secondary | ICD-10-CM | POA: Insufficient documentation

## 2020-12-19 DIAGNOSIS — C180 Malignant neoplasm of cecum: Secondary | ICD-10-CM | POA: Diagnosis not present

## 2020-12-19 MED ORDER — CAPECITABINE 500 MG PO TABS
ORAL_TABLET | ORAL | 0 refills | Status: DC
  Filled 2020-12-19: qty 98, fill #0

## 2020-12-19 NOTE — Progress Notes (Signed)
[facility] OFFICE PROGRESS NOTE   Diagnosis: Appendix goblet cell adenocarcinoma  INTERVAL HISTORY:   [name] feels well.  No complaint.  He plans to return to work on 12/31/2020.  Objective:  Vital signs in last 24 hours:  Blood pressure 134/79, pulse (!) 56, temperature 98.9 F (37.2 C), temperature source Oral, resp. rate 20, weight 187 lb 6.4 oz (85 kg), SpO2 100 %.    Skin: Abdominal wounds are healed    Lab Results:  Lab Results  Component Value Date   WBC 5.4 12/01/2020   HGB 13.6 12/01/2020   HCT 42.0 12/01/2020   MCV 89.0 12/01/2020   PLT 214 12/01/2020   NEUTROABS 3.4 12/01/2020    CMP  Lab Results  Component Value Date   NA 139 12/01/2020   K 3.9 12/01/2020   CL 103 12/01/2020   CO2 25 12/01/2020   GLUCOSE 102 (H) 12/01/2020   BUN 18 12/01/2020   CREATININE 0.66 12/01/2020   CALCIUM 8.7 (L) 12/01/2020   PROT 6.2 (L) 10/08/2020   ALBUMIN 3.0 (L) 10/08/2020   AST 22 10/08/2020   ALT 43 10/08/2020   ALKPHOS 52 10/08/2020   BILITOT 0.9 10/08/2020   GFRNONAA >60 12/01/2020    Lab Results  Component Value Date   CEA1 1.6 10/04/2020   Medications: I have reviewed the patient's current medications.   Assessment/Plan:  1. Goblet cell adenocarcinoma of appendix, status post an appendectomy 09/28/2020  Low-grade goblet cell adenocarcinoma with tumor invading through the muscularis propria, pT3, no lymphovascular invasion.  Grade 1.  Proximal resection margin positive.  Focal disruption in the mid segment noted  CT abdomen/pelvis 8/36/6294- acute uncomplicated appendicitis  CT chest 10/03/2020- no suspicious pulmonary nodules, no evidence of metastatic disease  Colonoscopy 11/27/2020-normal appendiceal orifice, multiple polyps removed from the colon  Right colectomy 11/28/2020- no evidence of metastatic disease, no residual goblet cell carcinoma, 23 negative lymph nodes 2. Post appendectomy fluid collection- drained by  interventional radiology 10/03/2020, negative cytology 3. Colonoscopy 11/27/2020, sessile serrated polyps, tubular adenomas, and hyperplastic polyps 4. Melanoma, left arm November 1989, Clark's level 3, Breslow depth 1.0  Recurrent left upper arm February 1993-excised  Recurrence left back, left lower lung (removed in 1995), solitary brain lesion resected in September 1996, status post treatment with the "[place] "regimen, measurable disease in the right lung-chemotherapy ineffective, right lung lesion removed November 1996  Resection of small bowel lesion in February 1997 and a mesenteric lesion in 1998     Disposition: Mr. 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  His case was presented at the GI tumor conference earlier today.  There is no evidence of melanoma involving the surgical specimen.  The appendix was perforated, but the perforation did not appear to be at the site of the tumor.  Dr. Johney Maine confirmed there was no clinical evidence of metastatic disease at the time of the right colectomy.  The consensus opinion from the GI tumor conference is to consider adjuvant systemic therapy based on the perforation.  I discussed options with Mr. Covin.  He understands he had an uncommon tumor of the appendix.  He has a good prognosis for long-term disease-free survival in the absence of systemic therapy.  I estimate his cure rate to be 80% or greater following surgery.  We discussed the expected increase in the cure rate with adjuvant 5-fluorouracil based chemotherapy, extrapolating from the colorectal literature.  We also discussed the small expected  absolute benefit with the addition of oxaliplatin.  I recommended he consider adjuvant capecitabine.  We reviewed potential toxicities associated with capecitabine including the chance of nausea, mucositis, diarrhea, and hematologic toxicity.  We discussed the sun sensitivity, rash, hyperpigmentation,  and hand/foot syndrome associated with capecitabine.  He would like to think about this for the next several days.  The plan is to begin capecitabine on 12/31/2020 if he decides to proceed with chemotherapy.  He will return for an office visit following cycle 1 capecitabine.  Betsy Coder, MD  12/19/2020  3:54 PM

## 2020-12-19 NOTE — Progress Notes (Signed)
The proposed treatment discussed in conference is for discussion purposes only and is not a binding recommendation.  The patients have not been physically examined, or presented with their treatment options.  Therefore, final treatment plans cannot be decided.   

## 2020-12-20 ENCOUNTER — Other Ambulatory Visit (HOSPITAL_COMMUNITY): Payer: Self-pay

## 2020-12-20 ENCOUNTER — Telehealth: Payer: Self-pay | Admitting: Genetic Counselor

## 2020-12-20 ENCOUNTER — Telehealth: Payer: Self-pay | Admitting: Pharmacy Technician

## 2020-12-20 NOTE — Telephone Encounter (Signed)
Scheduled appiontment per 06/02 sch msg. Patient is aware.

## 2020-12-20 NOTE — Telephone Encounter (Signed)
Oral Oncology Patient Advocate Encounter  Tried submitting test claim for Xeloda and insurance rejected stating it must be filled at [facility].  Rx sent to [facility].  Will follow up with [facility] Rep for updates on copay and delivery status.  [place] Patient [place] Phone [phone] Fax [phone] 12/21/2020 11:29 AM

## 2020-12-21 ENCOUNTER — Telehealth: Payer: Self-pay | Admitting: Pharmacist

## 2020-12-21 ENCOUNTER — Encounter: Payer: Self-pay | Admitting: Pharmacist

## 2020-12-21 DIAGNOSIS — C180 Malignant neoplasm of cecum: Secondary | ICD-10-CM

## 2020-12-21 MED ORDER — CAPECITABINE 500 MG PO TABS: ORAL_TABLET | ORAL | 0 refills | Status: DC

## 2020-12-21 NOTE — Telephone Encounter (Signed)
Oral Chemotherapy Pharmacist Encounter  Due to insurance restriction the medication could not be filled at [facility]. Prescription has been e-scribed to [facility]  Supportive information was faxed to [facility]. We will continue to follow medication access.   Called and notified patient. He is still trying to decide if he wanted to proceed with the Xeloda. He will call to let us know what he decides.  Darl Pikes, PharmD, BCPS, [facility] Hematology/Oncology Clinical Pharmacist [facility]/AP [facility] [phone]  12/21/2020 11:02 AM

## 2020-12-21 NOTE — Telephone Encounter (Signed)
Oral Oncology Pharmacist Encounter  Received new prescription for Xeloda (capecitabine) for the treatment of low-grade appendix goblet cell adenocarcinoma. S/p appendectomy on 09/28/2020, appendix was perforated so patient will receive adjuvant treatment with capecitabine. Planned start 12/31/20.  BMP from 12/01/20 assessed, no relevant lab abnormalities. Prescription dose and frequency assessed.   Current medication list in Epic reviewed, no DDIs with capecitabine identified.  Evaluated chart and no patient barriers to medication adherence identified.   Prescription has been e-scribed to the [facility] for benefits analysis and approval.  Oral Oncology Clinic will continue to follow for insurance authorization, copayment issues, initial counseling and start date.   Darl Pikes, PharmD, BCPS, BCOP, CPP Hematology/Oncology Clinical Pharmacist Practitioner [facility]/AP [facility] [phone]  12/21/2020 10:44 AM

## 2020-12-24 ENCOUNTER — Other Ambulatory Visit: Payer: Self-pay

## 2020-12-24 ENCOUNTER — Inpatient Hospital Stay: Payer: 59

## 2020-12-24 DIAGNOSIS — C181 Malignant neoplasm of appendix: Secondary | ICD-10-CM | POA: Diagnosis not present

## 2020-12-24 DIAGNOSIS — C180 Malignant neoplasm of cecum: Secondary | ICD-10-CM

## 2020-12-24 LAB — CMP (CANCER CENTER ONLY)
ALT: 16 U/L (ref 0–44)
AST: 12 U/L — ABNORMAL LOW (ref 15–41)
Albumin: 4.1 g/dL (ref 3.5–5.0)
Alkaline Phosphatase: 43 U/L (ref 38–126)
Anion gap: 8 (ref 5–15)
BUN: 12 mg/dL (ref 6–20)
CO2: 29 mmol/L (ref 22–32)
Calcium: 9.2 mg/dL (ref 8.9–10.3)
Chloride: 101 mmol/L (ref 98–111)
Creatinine: 0.77 mg/dL (ref 0.61–1.24)
GFR, Estimated: >60 mL/min (ref >60)
Glucose, Bld: 108 mg/dL — ABNORMAL HIGH (ref 70–99)
Potassium: 4.1 mmol/L (ref 3.5–5.1)
Sodium: 138 mmol/L (ref 135–145)
Total Bilirubin: 0.5 mg/dL (ref 0.3–1.2)
Total Protein: 7.3 g/dL (ref 6.5–8.1)

## 2020-12-24 LAB — CBC WITH DIFFERENTIAL (CANCER CENTER ONLY)
Abs Immature Granulocytes: 0.01 10*3/uL (ref 0.00–0.07)
Basophils Absolute: 0 10*3/uL (ref 0.0–0.1)
Basophils Relative: 0 %
Eosinophils Absolute: 0.1 10*3/uL (ref 0.0–0.5)
Eosinophils Relative: 2 %
HCT: 41.1 % (ref 39.0–52.0)
Hemoglobin: 13.5 g/dL (ref 13.0–17.0)
Immature Granulocytes: 0 %
Lymphocytes Relative: 29 %
Lymphs Abs: 1.6 10*3/uL (ref 0.7–4.0)
MCH: 28.3 pg (ref 26.0–34.0)
MCHC: 32.8 g/dL (ref 30.0–36.0)
MCV: 86.2 fL (ref 80.0–100.0)
Monocytes Absolute: 0.4 10*3/uL (ref 0.1–1.0)
Monocytes Relative: 8 %
Neutro Abs: 3.5 10*3/uL (ref 1.7–7.7)
Neutrophils Relative %: 61 %
Platelet Count: 254 10*3/uL (ref 150–400)
RBC: 4.77 MIL/uL (ref 4.22–5.81)
RDW: 12.3 % (ref 11.5–15.5)
WBC Count: 5.7 10*3/uL (ref 4.0–10.5)
nRBC: 0 % (ref 0.0–0.2)

## 2020-12-24 NOTE — Telephone Encounter (Signed)
Oral Oncology Patient Advocate Encounter  Prior Authorization for Xeloda has been approved.    PA#  [id number] Effective dates: 12/24/20 through 12/24/21  Patient must fill at [facility].  Will follow up with [facility] for copay and shipment information.  Oral Oncology Clinic will continue to follow.   [place] Patient [place] Phone [phone] Fax [phone] 12/24/2020 3:14 PM

## 2020-12-24 NOTE — Telephone Encounter (Signed)
Oral Oncology Patient Advocate Encounter  Received notification from [facility] that prior authorization for Xeloda is required.  PA submitted on CoverMyMeds Key [id number] Status is pending  Oral Oncology Clinic will continue to follow.  [place] Patient [place] Phone [phone] Fax [phone] 12/24/2020 10:25 AM

## 2020-12-26 ENCOUNTER — Encounter: Payer: Self-pay | Admitting: Genetic Counselor

## 2020-12-26 ENCOUNTER — Inpatient Hospital Stay (HOSPITAL_BASED_OUTPATIENT_CLINIC_OR_DEPARTMENT_OTHER): Payer: 59 | Admitting: Genetic Counselor

## 2020-12-26 ENCOUNTER — Other Ambulatory Visit: Payer: Self-pay

## 2020-12-26 ENCOUNTER — Inpatient Hospital Stay: Payer: 59

## 2020-12-26 DIAGNOSIS — C180 Malignant neoplasm of cecum: Secondary | ICD-10-CM

## 2020-12-26 DIAGNOSIS — C439 Malignant melanoma of skin, unspecified: Secondary | ICD-10-CM

## 2020-12-26 DIAGNOSIS — C7931 Secondary malignant neoplasm of brain: Secondary | ICD-10-CM | POA: Diagnosis not present

## 2020-12-26 DIAGNOSIS — K635 Polyp of colon: Secondary | ICD-10-CM | POA: Diagnosis not present

## 2020-12-26 DIAGNOSIS — C799 Secondary malignant neoplasm of unspecified site: Secondary | ICD-10-CM

## 2020-12-26 DIAGNOSIS — C181 Malignant neoplasm of appendix: Secondary | ICD-10-CM

## 2020-12-26 LAB — CBC WITH DIFFERENTIAL (CANCER CENTER ONLY)
Abs Immature Granulocytes: 0.01 10*3/uL (ref 0.00–0.07)
Basophils Absolute: 0 10*3/uL (ref 0.0–0.1)
Basophils Relative: 1 %
Eosinophils Absolute: 0.1 10*3/uL (ref 0.0–0.5)
Eosinophils Relative: 1 %
HCT: 41.7 % (ref 39.0–52.0)
Hemoglobin: 13.9 g/dL (ref 13.0–17.0)
Immature Granulocytes: 0 %
Lymphocytes Relative: 29 %
Lymphs Abs: 1.6 10*3/uL (ref 0.7–4.0)
MCH: 28.5 pg (ref 26.0–34.0)
MCHC: 33.3 g/dL (ref 30.0–36.0)
MCV: 85.6 fL (ref 80.0–100.0)
Monocytes Absolute: 0.5 10*3/uL (ref 0.1–1.0)
Monocytes Relative: 8 %
Neutro Abs: 3.4 10*3/uL (ref 1.7–7.7)
Neutrophils Relative %: 61 %
Platelet Count: 234 10*3/uL (ref 150–400)
RBC: 4.87 MIL/uL (ref 4.22–5.81)
RDW: 12.3 % (ref 11.5–15.5)
WBC Count: 5.6 10*3/uL (ref 4.0–10.5)
nRBC: 0 % (ref 0.0–0.2)

## 2020-12-26 LAB — CMP (CANCER CENTER ONLY)
ALT: 17 U/L (ref 0–44)
AST: 14 U/L — ABNORMAL LOW (ref 15–41)
Albumin: 4 g/dL (ref 3.5–5.0)
Alkaline Phosphatase: 55 U/L (ref 38–126)
Anion gap: 12 (ref 5–15)
BUN: 13 mg/dL (ref 6–20)
CO2: 26 mmol/L (ref 22–32)
Calcium: 9.6 mg/dL (ref 8.9–10.3)
Chloride: 102 mmol/L (ref 98–111)
Creatinine: 0.84 mg/dL (ref 0.61–1.24)
GFR, Estimated: >60 mL/min (ref >60)
Glucose, Bld: 114 mg/dL — ABNORMAL HIGH (ref 70–99)
Potassium: 4.6 mmol/L (ref 3.5–5.1)
Sodium: 140 mmol/L (ref 135–145)
Total Bilirubin: 0.4 mg/dL (ref 0.3–1.2)
Total Protein: 8.1 g/dL (ref 6.5–8.1)

## 2020-12-26 LAB — GENETIC SCREENING ORDER

## 2020-12-26 NOTE — Progress Notes (Signed)
REFERRING PROVIDER: Ladell Pier, MD [address] [place],  [place] [address]  PRIMARY PROVIDER:  Tammi Sou, MD  PRIMARY REASON FOR VISIT:  1. Melanoma of skin ([place])   2. Polyposis of colon   3. Cancer of appendix ([place])   4. 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 Metastatic malignant melanoma ([place])      HISTORY OF PRESENT ILLNESS:   Sean [name], a 60 y.o. male, was seen for a [facility] cancer genetics consultation at the request of Dr. Benay Spice due to a personal and family history of cancer and personal history of polyps.  Sean [name] presents to clinic today to discuss the possibility of a hereditary predisposition to cancer, genetic testing, and to further clarify his future cancer risks, as well as potential cancer risks for family members.   In 1998, at the age of 68, Sean [name] was diagnosed with melanoma of the left arm. The treatment plan surgery and immunotherapy due to metastatic nature of the cancer.  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  Lastly, Sean [name] has a diagnosis of polyposis with greater than 10 colon polyps.   CANCER HISTORY:  Oncology History   No history exists.     Past Medical History:  Diagnosis Date  . Arthritis    lower back  . Asymmetrical sensorineural hearing loss    L>>R: ENT eval 04/2018.  Pt declined MRI brain at that time.  . Cancer of appendix ([place])   . 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  CT chest neg for metastatic dz, CEA neg. R hemicolectomy with iliocolonic anastomosis 11/2020.  Marland Kitchen History of adenomatous polyp of colon 07/2016  . History of vitamin D deficiency    still present as of 09/2017.  High dose vit D started 10/16/17.  Increased dose to 50K twice per week 02/18/18.  Marland Kitchen  Hyperlipidemia    Mild, never meds  . Metastatic malignant melanoma ([place]) 1989   Recurrence L upper arm 1993.  Then left back and LUL lung recurrence 1995-resected.  Brain lesion recurrence 1996-resected.  RLL lung lesion 1996--chemo & then resection.  Small bowel recurrence resected 1997, then mesenteric lesion resected 1998.  No sign of recurrence since 1998.  Released by onc/Dr. Magrinot as of 12/2015 (needs annual dermatologist exam and annual ophth exam).  . Polyposis of colon   . Pre-diabetes   . Prediabetes    A1c 5.9% 12/2014.  6.2% 09/2017. 6.3% 04/2019.  Marland Kitchen Skin cancer    melanoma    Past Surgical History:  Procedure Laterality Date  . APPENDECTOMY    . BRAIN SURGERY     Removal of melanoma met  . COLON SURGERY  11/2020   For colon ca resection.  iliocolonic anastomosis.  . COLONOSCOPY  2009; 07/2016   Dr. Wynetta Emery at [facility]; next colonoscopy due 07/2019.  Marland Kitchen COLONOSCOPY WITH PROPOFOL N/A 08/18/2016   Tubular adenoma.  Recall 3 yrs.  Procedure: COLONOSCOPY WITH PROPOFOL;  Surgeon: Garlan Fair, MD;  Location: [facility];  Service: Endoscopy;  Laterality: N/A;  . IR SINUS/FIST TUBE CHK-NON GI  10/08/2020  . LAPAROSCOPIC APPENDECTOMY N/A 09/28/2020   Procedure: APPENDECTOMY LAPAROSCOPIC;  Surgeon: Erroll Luna, MD;  Location: [facility] ORS;  Service: General;  Laterality: N/A;  . LUNG SURGERY     "              "          "        "  .  SMALL INTESTINE SURGERY  1998   x2 from melanoma    Social History   Socioeconomic History  . Marital status: Divorced    Spouse name: Not on file  . Number of children: 2  . Years of education: Not on file  . 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 Smoking status: Never Smoker  . Smokeless tobacco: Former Network engineer  . Vaping Use: Never used  Substance and Sexual Activity  . Alcohol use: Not Currently    Comment: 1 drink/ 2 weeks  . Drug use: No  .  Sexual activity: Yes  Other Topics Concern  . Not on file  Social History Narrative   Married, 2 sons.   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   No T/A/Ds.   Diet ok except fast foods.   Social Determinants of Health   Financial Resource Strain: Not on file  Food Insecurity: Not on file  Transportation Needs: Not on file  Physical Activity: Not on file  Stress: Not on file  Social Connections: Not on file     FAMILY HISTORY:  We obtained a detailed, 4-generation family history.  Significant diagnoses are listed below: Family History  Problem Relation Age of Onset  . Breast cancer Mother 26       metastatic  . Alcohol abuse Father   . Thyroid cancer Sister 56  . Colon cancer Neg Hx   . Esophageal cancer Neg Hx   . Stomach cancer Neg Hx   . Rectal cancer Neg Hx     The patient has two sons who are cancer free.  He has a sister who was diagnosed with thyroid cancer at age 105.  Both parents are deceased.  The patient's mother was diagnosed with breast cancer at 61 and died of metastatic breast cancer at 52.  She was an only child.  His grandparents died of non-cancer related issues.  The patient's father died of heart disease and alcohol use.  He had many siblings who he was not close with but is not aware of having cancer.  HE is unsure why his paternal grandparents died.  Sean [name] is unaware of previous family history of genetic testing for hereditary cancer risks. Patient's maternal ancestors are of [place] descent, and paternal ancestors are of [place] and [place] descent. There is no reported Ashkenazi Jewish ancestry. There is no known consanguinity.  GENETIC COUNSELING ASSESSMENT: Sean [name] is a 60 y.o. male with a personal and family history of cancer and personal history of polyposis which is somewhat suggestive of a hereditary cancer syndrome and predisposition to cancer given the polyposis and the young age of onset of melanoma. We, therefore,  discussed and recommended the following at today's visit.   DISCUSSION: We discussed that 5 - 10% of cancer is hereditary.  Most cancer is not hereditary, but since he had such a young onset of melanoma and that he was diagnosed with two cancers along with a family history of cancer, his chance of having a hereditary cancer syndrome is slightly greater.  Along with the diagnosis of polyposis of the colon, he is at increased risk for colon cancer.  Many people have colon polyps, but most have fewer than 5 polyps.  When an individual develops more than 10 polyps is increases the risk for a polyposis syndrome.  We discussed that testing  is beneficial for several reasons including knowing how to follow individuals after completing their treatment,and understand if other family members could be at risk for cancer and allow them to undergo genetic testing.   We reviewed the characteristics, features and inheritance patterns of hereditary cancer syndromes. We also discussed genetic testing, including the appropriate family members to test, the process of testing, insurance coverage and turn-around-time for results. We discussed the implications of a negative, positive, carrier and/or variant of uncertain significant result. We recommended Sean [name] pursue genetic testing for the multi-cancer gene panel + RNA.  The Multi-Gene Panel offered by [facility] includes sequencing and/or deletion duplication testing of the following 85 genes: AIP, ALK, APC, ATM, AXIN2,BAP1,  BARD1, BLM, BMPR1A, BRCA1, BRCA2, BRIP1, CASR, CDC73, CDH1, CDK4, CDKN1B, CDKN1C, CDKN2A (p14ARF), CDKN2A (p16INK4a), CEBPA, CHEK2, CTNNA1, DICER1, DIS3L2, EGFR (c.2369C>T, p.Thr790Met variant only), EPCAM (Deletion/duplication testing only), FH, FLCN, GATA2, GPC3, GREM1 (Promoter region deletion/duplication testing only), HOXB13 (c.251G>A, p.Gly84Glu), HRAS, KIT, MAX, MEN1, MET, MITF (c.952G>A, p.Glu318Lys variant only), MLH1, MSH2, MSH3, MSH6, MUTYH, NBN,  NF1, NF2, NTHL1, PALB2, PDGFRA, PHOX2B, PMS2, POLD1, POLE, POT1, PRKAR1A, PTCH1, PTEN, RAD50, RAD51C, RAD51D, RB1, RECQL4, RET, RNF43, RUNX1, SDHAF2, SDHA (sequence changes only), SDHB, SDHC, SDHD, SMAD4, SMARCA4, SMARCB1, SMARCE1, STK11, SUFU, TERC, TERT, TMEM127, TP53, TSC1, TSC2, VHL, WRN and WT1.    Based on Sean [name] personal and family history of cancer and personal history of polyposis, he meets medical criteria for genetic testing. Despite that he meets criteria, he may still have an out of pocket cost. We discussed that if his out of pocket cost for testing is over $100, the laboratory will call and confirm whether he wants to proceed with testing.  If the out of pocket cost of testing is less than $100 he will be billed by the genetic testing laboratory.   PLAN: After considering the risks, benefits, and limitations, Sean [name] provided informed consent to pursue genetic testing and the blood sample was sent to [facility] for analysis of the Multi-cancer panel + RNA. Results should be available within approximately 2-3 weeks' time, at which point they will be disclosed by telephone to Sean [name], as will any additional recommendations warranted by these results. Sean [name] will receive a summary of his genetic counseling visit and a copy of his results once available. This information will also be available in Epic.   Lastly, we encouraged Sean [name] to remain in contact with cancer genetics annually so that we can continuously update the family history and inform him of any changes in cancer genetics and testing that may be of benefit for this family.   Sean [name] questions were answered to his satisfaction today. Our contact information was provided should additional questions or concerns arise. Thank you for the referral and allowing [place] to share in the care of your patient.   Aviv Rota P. Florene Glen, [place], [facility] Licensed, Mudlogger [email or web address] phone: [phone]  The patient was seen for a total of 45 minutes in face-to-face genetic counseling.  The patient was seen alone.  This patient was discussed with Drs. Magrinat, Lindi Adie and/or Burr Medico who agrees with the above.    _______________________________________________________________________ For Office Staff:  Number of people involved in session: 1 Was an Intern/ student involved with case: no

## 2020-12-26 NOTE — Telephone Encounter (Addendum)
Oral Oncology Patient Advocate Encounter  Spoke with Sean [name] this morning regarding his decision to start Xeloda.  He has decided that he is not going to take Xeloda.  He stated that the side effects of diarrhea and hand/foot syndrome would not be manageable since he is a Garment/textile technologist. He does not have access to a restroom all the time (diarrhea) and he has to use his handheld computer for work and walk a lot (hand/foot syndrome).  I let him know I would inform Dr Benay Spice of his decision. If there are any other options for treatment Dr Benay Spice would like to discuss, Sean [name] would appreciate a call.  Sean [name] Patient Sean [name] Phone [phone] Fax [phone] 12/26/2020 10:16 AM

## 2021-01-01 ENCOUNTER — Telehealth: Payer: Self-pay | Admitting: *Deleted

## 2021-01-01 NOTE — Telephone Encounter (Signed)
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 Offered to move his 6/29 visit out to August if he prefers or does he wish to keep 6/29 to discuss care further. He decided to keep the 6/29 visit with Dr. Benay Spice.

## 2021-01-11 ENCOUNTER — Ambulatory Visit: Payer: Self-pay | Admitting: Genetic Counselor

## 2021-01-11 ENCOUNTER — Telehealth: Payer: Self-pay | Admitting: Genetic Counselor

## 2021-01-11 ENCOUNTER — Encounter: Payer: Self-pay | Admitting: Genetic Counselor

## 2021-01-11 DIAGNOSIS — Z1379 Encounter for other screening for genetic and chromosomal anomalies: Secondary | ICD-10-CM | POA: Insufficient documentation

## 2021-01-11 NOTE — Progress Notes (Signed)
HPI:  [name] was previously seen in the [facility] clinic due to a personal and family history of cancer and concerns regarding a hereditary predisposition to cancer. Please refer to our prior cancer genetics clinic note for more information regarding our discussion, assessment and recommendations, at the time. [name] recent genetic test results were disclosed to him, as were recommendations warranted by these results. These results and recommendations are discussed in more detail below.  CANCER HISTORY:  Oncology History  Melanoma of skin ([place])  09/10/2011 Initial Diagnosis   Melanoma of skin ([place])   01/09/2021 Genetic Testing   Negative genetic testing on the Multi-cancer panel.  The Multi-Gene Panel offered by [facility] includes sequencing and/or deletion duplication testing of the following 84 genes: AIP, ALK, APC, ATM, AXIN2,BAP1,  BARD1, BLM, BMPR1A, BRCA1, BRCA2, BRIP1, CASR, CDC73, CDH1, CDK4, CDKN1B, CDKN1C, CDKN2A (p14ARF), CDKN2A (p16INK4a), CEBPA, CHEK2, CTNNA1, DICER1, DIS3L2, EGFR (c.2369C>T, p.Thr790Met variant only), EPCAM (Deletion/duplication testing only), FH, FLCN, GATA2, GPC3, GREM1 (Promoter region deletion/duplication testing only), HOXB13 (c.251G>A, p.Gly84Glu), HRAS, KIT, MAX, MEN1, MET, MITF (c.952G>A, p.Glu318Lys variant only), MLH1, MSH2, MSH3, MSH6, MUTYH, NBN, NF1, NF2, NTHL1, PALB2, PDGFRA, PHOX2B, PMS2, POLD1, POLE, POT1, PRKAR1A, PTCH1, PTEN, RAD50, RAD51C, RAD51D, RB1, RECQL4, RET, RUNX1, SDHAF2, SDHA (sequence changes only), SDHB, SDHC, SDHD, SMAD4, SMARCA4, SMARCB1, SMARCE1, STK11, SUFU, TERC, TERT, TMEM127, TP53, TSC1, TSC2, VHL, WRN and WT1.  The report date is June 22. 2022.     FAMILY HISTORY:  We obtained a detailed, 4-generation family history.  Significant diagnoses are listed below: Family History  Problem Relation Age of Onset   Breast cancer Mother 64       metastatic   Alcohol abuse Father    Thyroid cancer Sister 44   Colon  cancer Neg Hx    Esophageal cancer Neg Hx    Stomach cancer Neg Hx    Rectal cancer Neg Hx     The patient has two sons who are cancer free.  He has a sister who was diagnosed with thyroid cancer at age 38.  Both parents are deceased.   The patient's mother was diagnosed with breast cancer at 33 and died of metastatic breast cancer at 54.  She was an only child.  His grandparents died of non-cancer related issues.   The patient's father died of heart disease and alcohol use.  He had many siblings who he was not close with but is not aware of having cancer.  HE is unsure why his paternal grandparents died.   [name] is unaware of previous family history of genetic testing for hereditary cancer risks. Patient's maternal ancestors are of [place] descent, and paternal ancestors are of [place] and [place] descent. There is no reported Ashkenazi Jewish ancestry. There is no known consanguinity.  GENETIC TEST RESULTS: Genetic testing reported out on January 09, 2021 through the Multi-cancer panel found no pathogenic mutations. The Multi-Gene Panel offered by [facility] includes sequencing and/or deletion duplication testing of the following 84 genes: AIP, ALK, APC, ATM, AXIN2,BAP1,  BARD1, BLM, BMPR1A, BRCA1, BRCA2, BRIP1, CASR, CDC73, CDH1, CDK4, CDKN1B, CDKN1C, CDKN2A (p14ARF), CDKN2A (p16INK4a), CEBPA, CHEK2, CTNNA1, DICER1, DIS3L2, EGFR (c.2369C>T, p.Thr790Met variant only), EPCAM (Deletion/duplication testing only), FH, FLCN, GATA2, GPC3, GREM1 (Promoter region deletion/duplication testing only), HOXB13 (c.251G>A, p.Gly84Glu), HRAS, KIT, MAX, MEN1, MET, MITF (c.952G>A, p.Glu318Lys variant only), MLH1, MSH2, MSH3, MSH6, MUTYH, NBN, NF1, NF2, NTHL1, PALB2, PDGFRA, PHOX2B, PMS2, POLD1, POLE, POT1, PRKAR1A, PTCH1, PTEN, RAD50, RAD51C, RAD51D, RB1, RECQL4,  RET, RUNX1, SDHAF2, SDHA (sequence changes only), SDHB, SDHC, SDHD, SMAD4, SMARCA4, SMARCB1, SMARCE1, STK11, SUFU, TERC, TERT, TMEM127, TP53, TSC1, TSC2, VHL,  WRN and WT1. The test report has been scanned into EPIC and is located under the Molecular Pathology section of the Results Review tab.  A portion of the result report is included below for reference.     We discussed with [name] that because current genetic testing is not perfect, it is possible there may be a gene mutation in one of these genes that current testing cannot detect, but that chance is small.  We also discussed, that there could be another gene that has not yet been discovered, or that we have not yet tested, that is responsible for the cancer diagnoses in the family. It is also possible there is a hereditary cause for the cancer in the family that [name] did not inherit and therefore was not identified in his testing.  Therefore, it is important to remain in touch with cancer genetics in the future so that we can continue to offer [name] the most up to date genetic testing.   ADDITIONAL GENETIC TESTING: We discussed with [name] that his genetic testing was fairly extensive.  If there are genes identified to increase cancer risk that can be analyzed in the future, we would be happy to discuss and coordinate this testing at that time.    CANCER SCREENING RECOMMENDATIONS: [name]'s test result is considered negative (normal).  This means that we have not identified a hereditary cause for his personal and family history of cancer at this time. Most cancers happen by chance and this negative test suggests that his cancer may fall into this category.    While reassuring, this does not definitively rule out a hereditary predisposition to cancer. It is still possible that there could be genetic mutations that are undetectable by current technology. There could be genetic mutations in genes that have not been tested or identified to increase cancer risk.  Therefore, it is recommended he continue to follow the cancer management and screening guidelines provided by his oncology  and primary healthcare provider.   An individual's cancer risk and medical management are not determined by genetic test results alone. Overall cancer risk assessment incorporates additional factors, including personal medical history, family history, and any available genetic information that may result in a personalized plan for cancer prevention and surveillance  RECOMMENDATIONS FOR FAMILY MEMBERS:  Individuals in this family might be at some increased risk of developing cancer, over the general population risk, simply due to the family history of cancer.  We recommended women in this family have a yearly mammogram beginning at age 40, or 10 years younger than the earliest onset of cancer, an annual clinical breast exam, and perform monthly breast self-exams. Women in this family should also have a gynecological exam as recommended by their primary provider. All family members should be referred for colonoscopy starting at age 45.  FOLLOW-UP: Lastly, we discussed with [name] that cancer genetics is a rapidly advancing field and it is possible that new genetic tests will be appropriate for him and/or his family members in the future. We encouraged him to remain in contact with cancer genetics on an annual basis so we can update his personal and family histories and let him know of advances in cancer genetics that may benefit this family.   Our contact number was provided. [name]'s questions were answered to his satisfaction, and   he knows he is welcome to call us at anytime with additional questions or concerns.    , MS, LCGC Licensed, Certified Genetic Counselor [email or web address]  

## 2021-01-11 NOTE — Telephone Encounter (Signed)
Revealed negative genetic testing.  Discussed that we do not know why he has melanoma and appendiceal cancer or why there is cancer in the family. It could be due to a different gene that we are not testing, or maybe our current technology may not be able to pick something up.  It will be important for him to keep in contact with genetics to keep up with whether additional testing may be needed.

## 2021-01-16 ENCOUNTER — Other Ambulatory Visit: Payer: Self-pay

## 2021-01-16 ENCOUNTER — Inpatient Hospital Stay (HOSPITAL_BASED_OUTPATIENT_CLINIC_OR_DEPARTMENT_OTHER): Payer: 59 | Admitting: Oncology

## 2021-01-16 VITALS — BP 128/84 | HR 69 | Temp 97.8°F | Resp 19 | Ht 68.0 in | Wt 188.0 lb

## 2021-01-16 DIAGNOSIS — C181 Malignant neoplasm of appendix: Secondary | ICD-10-CM

## 2021-01-16 NOTE — Progress Notes (Signed)
[facility] OFFICE PROGRESS NOTE   Diagnosis: Goblet cell adenocarcinoma of the appendix  INTERVAL HISTORY:   Sean [name] returns as scheduled.  He feels well.  No complaint.  He is working.  He returns for further discussion regarding adjuvant systemic treatment options.  Objective:  Vital signs in last 24 hours:  Blood pressure 128/84, pulse 69, temperature 97.8 F (36.6 C), temperature source Oral, resp. rate 19, height 5\' 8"  (1.727 m), weight 188 lb (85.3 kg), SpO2 99 %.    Resp: Lungs clear bilaterally Cardio: Regular rate and rhythm GI: No mass, nontender, no hepatosplenomegaly, no apparent ascites Vascular: No leg edema   Lab Results:  Lab Results  Component Value Date   WBC 5.6 12/26/2020   HGB 13.9 12/26/2020   HCT 41.7 12/26/2020   MCV 85.6 12/26/2020   PLT 234 12/26/2020   NEUTROABS 3.4 12/26/2020    CMP  Lab Results  Component Value Date   NA 140 12/26/2020   K 4.6 12/26/2020   CL 102 12/26/2020   CO2 26 12/26/2020   GLUCOSE 114 (H) 12/26/2020   BUN 13 12/26/2020   CREATININE 0.84 12/26/2020   CALCIUM 9.6 12/26/2020   PROT 8.1 12/26/2020   ALBUMIN 4.0 12/26/2020   AST 14 (L) 12/26/2020   ALT 17 12/26/2020   ALKPHOS 55 12/26/2020   BILITOT 0.4 12/26/2020   GFRNONAA >60 12/26/2020    Lab Results  Component Value Date   CEA1 1.6 10/04/2020    Lab Results  Component Value Date   INR 1.0 10/03/2020    Imaging:  No results found.  Medications: I have reviewed the patient's current medications.   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  Grade 1.  Proximal resection margin positive.  Focal disruption in the mid segment noted CT abdomen/pelvis 11/06/3788- acute uncomplicated appendicitis CT chest 10/03/2020- no suspicious pulmonary nodules, no  evidence of metastatic disease Colonoscopy 11/27/2020-normal appendiceal orifice, multiple polyps removed from the colon Right colectomy 11/28/2020- no evidence of metastatic disease, no residual goblet cell carcinoma, 23 negative lymph nodes Post appendectomy fluid collection- drained by interventional radiology 10/03/2020, negative cytology Colonoscopy 11/27/2020, sessile serrated polyps, tubular adenomas, and hyperplastic polyps Melanoma, left arm November 1989, Clark's level 3, Breslow depth 1.0 Recurrent left upper arm February 1993-excised Recurrence left back, left lower lung (removed in 1995), solitary brain lesion resected in September 1996, status post treatment with the "[place] "regimen, measurable disease in the right lung-chemotherapy ineffective, right lung lesion removed November 1996 Resection of small bowel lesion in February 1997 and a mesenteric lesion in 1998      Disposition: Sean [name] appears well.  He is recovered from the right colectomy.  We discussed adjuvant treatment options.  I explained he appears to be at significant risk of developing recurrent appendix adenocarcinoma based on the ruptured appendix.  We discussed systemic treatment options including capecitabine, 5-FU/leucovorin, and FOLFOX.  I discussed the expected relative and absolute benefits associated with these regimens.  He understands we are extrapolating from the colorectal cancer literature.  He remains concerned of potential toxicities associated with chemotherapy, especially diarrhea and hand/foot syndrome.  I explained the chemotherapy can be dose reduced or discontinued if he develops toxicity.  He understands most patients treated with these regimens do not develop significant diarrhea or hand/foot syndrome.  He indicated he does not wish to receive FOLFOX  or weekly 5-FU/leucovorin.  I explained no therapy will be curative if the appendix adenocarcinoma returns.  He remains undecided on whether  to consider adjuvant therapy.  He will contact us on 01/22/2021 with his decision.  I do not recommend adjuvant therapy if he does not start treatment within the next 2 weeks.  He is now greater than 3 months out from the original surgery.  He will return for a follow-up visit as scheduled 03/15/2021.  We will see him sooner if he decides to proceed with adjuvant therapy.  Betsy Coder, MD  01/16/2021  10:16 AM

## 2021-01-22 ENCOUNTER — Telehealth: Payer: Self-pay

## 2021-01-22 ENCOUNTER — Other Ambulatory Visit: Payer: Self-pay | Admitting: Oncology

## 2021-01-22 NOTE — Telephone Encounter (Signed)
TC from Pt stating he decided he wants to start the chemo pills. Informed Dr

## 2021-01-23 ENCOUNTER — Other Ambulatory Visit: Payer: Self-pay

## 2021-01-23 DIAGNOSIS — C180 Malignant neoplasm of cecum: Secondary | ICD-10-CM

## 2021-01-23 MED ORDER — CAPECITABINE 500 MG PO TABS: ORAL_TABLET | ORAL | 0 refills | Status: DC

## 2021-01-24 NOTE — Telephone Encounter (Signed)
Copay at [place] is $87.98. I have asked our rep to see if this has a deductible amount included.

## 2021-01-25 ENCOUNTER — Telehealth: Payer: Self-pay | Admitting: Pharmacist

## 2021-01-25 ENCOUNTER — Telehealth: Payer: Self-pay

## 2021-01-25 NOTE — Telephone Encounter (Signed)
VM message from Pt stating he would like to get assistance with drug cost from the manufacturer.TC to Elesa Massed PharmD who contacted Pt to start the paperwork for assistance.

## 2021-01-25 NOTE — Telephone Encounter (Signed)
Oral Chemotherapy Pharmacist Encounter  Mr. Mcnerney capecitabine will be delivered tomorrow by [facility]. He will get started on Monday 01/28/21. Patient advocate, Bethena Roys will help Mr. Sawaya apply for manufacturer assistance to cover the remaining cycles of capecitabine.   Patient Education I spoke with patient for overview of new oral chemotherapy medication: Xeloda (capecitabine) for the treatment of low-grade appendix goblet cell adenocarcinoma. S/p appendectomy on 09/28/2020, appendix was perforated so patient will receive adjuvant treatment with capecitabine.   Pt is doing well. Counseled patient on administration, dosing, side effects, monitoring, drug-food interactions, safe handling, storage, and disposal. Patient will take 4 tablets (2000 mg) by mouth in AM and 3 tablets (1500mg ) in PM. Take with food. Take for 14 days, then hold for 7 days. Repeat every 21 days.  Side effects include but not limited to: diarrhea, hand-foot syndrome, edema, decreased wbc, fatigue, N/V.    Diarrhea: he plans of picking up some loperamide this weekend Hand-foot syndrome: suggested the use of [place] Smooth  He is planning on taking a month off of work to make sure he tolerates the capecitabine. 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   Reviewed with patient importance of keeping a medication schedule and plan for any missed doses.  After discussion with patient no patient barriers to medication adherence identified.   Mr. Lienhard voiced understanding and appreciation. All questions answered. Medication handout and calendar provided.  Provided patient with [facility] phone number. Patient knows to call the office with questions or concerns. Oral Chemotherapy [facility] will continue to follow.  Darl Pikes, PharmD, BCPS, BCOP, CPP Hematology/Oncology Clinical Pharmacist  Practitioner [facility]/HP/AP [facility] [phone]  01/25/2021 12:34 PM

## 2021-01-30 ENCOUNTER — Telehealth: Payer: Self-pay | Admitting: *Deleted

## 2021-01-30 ENCOUNTER — Encounter: Payer: Self-pay | Admitting: Oncology

## 2021-01-30 NOTE — Telephone Encounter (Signed)
Notified that his requested letter is ready. He requests it be mailed to home. Placed in mail for tomorrow.

## 2021-02-04 ENCOUNTER — Telehealth: Payer: Self-pay | Admitting: Pharmacy Technician

## 2021-02-04 NOTE — Telephone Encounter (Signed)
Oral Oncology Patient Advocate Encounter  Spoke with patient this morning regarding an application for [place] in an effort to reduce patient's out of pocket expense for Xeloda to $0.    Patient electronically signed the patient consent form online.  MD portion of application completed and faxed to [phone].   [employer] patient assistance phone number for follow up is [phone].   This encounter will be updated until final determination.   [place] Patient [place] Phone [phone] Fax [phone] 02/04/2021 4:21 PM

## 2021-02-05 ENCOUNTER — Other Ambulatory Visit: Payer: Self-pay | Admitting: Oncology

## 2021-02-05 DIAGNOSIS — C180 Malignant neoplasm of cecum: Secondary | ICD-10-CM

## 2021-02-07 NOTE — Telephone Encounter (Signed)
Oral Oncology Patient Advocate Encounter  Received notification from [facility] Patient Assistance program that patient has been successfully enrolled into their program to receive Xeloda from the manufacturer at $0 out of pocket until therapy discontinued, no longer meets eligibility requirements, or health insurance or financial status changes.   I will call Sean [name] and let him know of the approval   Specialty Pharmacy that will dispense medication is [facility]  [phone]).   Oral Oncology Clinic will continue to follow.  [name] Phone [phone] Fax [phone] 02/07/2021 11:24 AM

## 2021-02-08 ENCOUNTER — Other Ambulatory Visit: Payer: Self-pay | Admitting: *Deleted

## 2021-02-08 DIAGNOSIS — C181 Malignant neoplasm of appendix: Secondary | ICD-10-CM

## 2021-02-11 ENCOUNTER — Telehealth: Payer: Self-pay | Admitting: *Deleted

## 2021-02-11 NOTE — Telephone Encounter (Addendum)
Called patient to f/u on 1st cycle of Xeloda. He reports week #1 went OK. 2nd week caused more fatigue and abdominal "issues". No N/V or diarrhea, but abdomen felt tender and he felt bloated. Did go out on 7/23 outdoors and cut trees w/headache later that day, but it resolved on 7/24. Denies any fever. Inquired if he thought about testing for COVID and he reports "no" and that he has not been anywhere since starting the Xeloda (last dose 7/24). Requested he call office if he worsens or develops N/V or diarrhea. Will hold off on refill till after seen by MD this week in case of dose change.

## 2021-02-13 ENCOUNTER — Telehealth: Payer: Self-pay

## 2021-02-13 ENCOUNTER — Inpatient Hospital Stay: Payer: 59 | Attending: Oncology | Admitting: Oncology

## 2021-02-13 ENCOUNTER — Inpatient Hospital Stay: Payer: 59

## 2021-02-13 ENCOUNTER — Other Ambulatory Visit: Payer: Self-pay

## 2021-02-13 VITALS — BP 136/79 | HR 65 | Temp 97.9°F | Resp 18 | Ht 68.0 in | Wt 193.2 lb

## 2021-02-13 DIAGNOSIS — R5381 Other malaise: Secondary | ICD-10-CM | POA: Insufficient documentation

## 2021-02-13 DIAGNOSIS — C181 Malignant neoplasm of appendix: Secondary | ICD-10-CM | POA: Diagnosis not present

## 2021-02-13 LAB — CBC WITH DIFFERENTIAL (CANCER CENTER ONLY)
Abs Immature Granulocytes: 0.02 10*3/uL (ref 0.00–0.07)
Basophils Absolute: 0 10*3/uL (ref 0.0–0.1)
Basophils Relative: 0 %
Eosinophils Absolute: 0.1 10*3/uL (ref 0.0–0.5)
Eosinophils Relative: 1 %
HCT: 41.3 % (ref 39.0–52.0)
Hemoglobin: 13.7 g/dL (ref 13.0–17.0)
Immature Granulocytes: 0 %
Lymphocytes Relative: 15 %
Lymphs Abs: 1.3 10*3/uL (ref 0.7–4.0)
MCH: 28.6 pg (ref 26.0–34.0)
MCHC: 33.2 g/dL (ref 30.0–36.0)
MCV: 86.2 fL (ref 80.0–100.0)
Monocytes Absolute: 0.7 10*3/uL (ref 0.1–1.0)
Monocytes Relative: 8 %
Neutro Abs: 6.3 10*3/uL (ref 1.7–7.7)
Neutrophils Relative %: 76 %
Platelet Count: 255 10*3/uL (ref 150–400)
RBC: 4.79 MIL/uL (ref 4.22–5.81)
RDW: 13.5 % (ref 11.5–15.5)
WBC Count: 8.3 10*3/uL (ref 4.0–10.5)
nRBC: 0 % (ref 0.0–0.2)

## 2021-02-13 LAB — CMP (CANCER CENTER ONLY)
ALT: 11 U/L (ref 0–44)
AST: 10 U/L — ABNORMAL LOW (ref 15–41)
Albumin: 4.1 g/dL (ref 3.5–5.0)
Alkaline Phosphatase: 48 U/L (ref 38–126)
Anion gap: 9 (ref 5–15)
BUN: 16 mg/dL (ref 6–20)
CO2: 27 mmol/L (ref 22–32)
Calcium: 8.8 mg/dL — ABNORMAL LOW (ref 8.9–10.3)
Chloride: 102 mmol/L (ref 98–111)
Creatinine: 0.81 mg/dL (ref 0.61–1.24)
GFR, Estimated: >60 mL/min (ref >60)
Glucose, Bld: 153 mg/dL — ABNORMAL HIGH (ref 70–99)
Potassium: 4 mmol/L (ref 3.5–5.1)
Sodium: 138 mmol/L (ref 135–145)
Total Bilirubin: 0.4 mg/dL (ref 0.3–1.2)
Total Protein: 7.6 g/dL (ref 6.5–8.1)

## 2021-02-13 NOTE — Progress Notes (Signed)
[place] OFFICE PROGRESS NOTE   Diagnosis: Appendiceal carcinoma  INTERVAL HISTORY:   [name] began cycle 1 adjuvant Xeloda on 01/28/2021.  No mouth sores, diarrhea, rash, nausea, or hand/foot pain.  He developed fatigue during the second week of Xeloda.  He had abdominal bloating and "soreness "beginning on  02/08/2021.  This began after eating at "hops ".  The abdominal symptoms have improved.  Malaise has also improved.  He was able to feel his "heartbeat "more while on Xeloda.  He has been working in his yard and taking long walks with his dog.  Objective:  Vital signs in last 24 hours:  Blood pressure 136/79, pulse 65, temperature 97.9 F (36.6 C), temperature source Oral, resp. rate 18, height '5\' 8"'$  (1.727 m), weight 193 lb 3.2 oz (87.6 kg), SpO2 100 %.    HEENT: No thrush or ulcers Resp: Lungs clear bilaterally Cardio: Regular rate and rhythm GI: Mild tenderness in the right lower abdomen, no mass, no apparent ascites, no hepatosplenomegaly Vascular: No leg edema  Skin: No rash, Palms and soles without erythema    Lab Results:  Lab Results  Component Value Date   WBC 8.3 02/13/2021   HGB 13.7 02/13/2021   HCT 41.3 02/13/2021   MCV 86.2 02/13/2021   PLT 255 02/13/2021   NEUTROABS 6.3 02/13/2021    CMP  Lab Results  Component Value Date   NA 140 12/26/2020   K 4.6 12/26/2020   CL 102 12/26/2020   CO2 26 12/26/2020   GLUCOSE 114 (H) 12/26/2020   BUN 13 12/26/2020   CREATININE 0.84 12/26/2020   CALCIUM 9.6 12/26/2020   PROT 8.1 12/26/2020   ALBUMIN 4.0 12/26/2020   AST 14 (L) 12/26/2020   ALT 17 12/26/2020   ALKPHOS 55 12/26/2020   BILITOT 0.4 12/26/2020   GFRNONAA >60 12/26/2020    Lab Results  Component Value Date   CEA1 1.6 10/04/2020    Lab Results  Component Value Date   INR 1.0 10/03/2020   LABPROT 13.2 10/03/2020    Imaging:  No results found.  Medications: I have reviewed the patient's current  medications.   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  Grade 1.  Proximal resection margin positive.  Focal disruption in the mid segment noted CT abdomen/pelvis [id number]- acute uncomplicated appendicitis CT chest 10/03/2020- no suspicious pulmonary nodules, no evidence of metastatic disease Colonoscopy 11/27/2020-normal appendiceal orifice, multiple polyps removed from the colon Right colectomy 11/28/2020- no evidence of metastatic disease, no residual goblet cell carcinoma, 23 negative lymph nodes Cycle 1 Xeloda 01/28/2021 Cycle 2 Xeloda 02/18/2021 Post appendectomy fluid collection- drained by interventional radiology 10/03/2020, negative cytology Colonoscopy 11/27/2020, sessile serrated polyps, tubular adenomas, and hyperplastic polyps Melanoma, 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  He experienced malaise and mild GI upset while on Xeloda.  No other apparent toxicity.  The plan is to proceed with cycle 2 Xeloda on 02/18/2021.  Sean [name] will call  for increased malaise or new symptoms with cycle 2.  He will return for an office and lab visit in 3 weeks.  Betsy Coder, MD  02/13/2021  9:42 AM

## 2021-02-13 NOTE — Telephone Encounter (Signed)
TC from Pt stating he was calling to confirm that there were no medication changes since he was accepted by [place] Patient assistance program to receive assistance with Xeloda. Pt stated he will be getting medication from [facility] and they will send medication to him. Informed Pt that there were no changes Per Dr Benay Spice with his medication and he could arrange delivery for the medication.Pt verbalized understanding. No further problems or concerns noted.

## 2021-02-25 ENCOUNTER — Encounter: Payer: Self-pay | Admitting: *Deleted

## 2021-02-25 NOTE — Progress Notes (Signed)
[place] form completed and faxed to [phone] w/copy to HIM and will provide patient his original at next visit.

## 2021-03-07 ENCOUNTER — Inpatient Hospital Stay (HOSPITAL_BASED_OUTPATIENT_CLINIC_OR_DEPARTMENT_OTHER): Payer: 59 | Admitting: Oncology

## 2021-03-07 ENCOUNTER — Other Ambulatory Visit: Payer: Self-pay

## 2021-03-07 ENCOUNTER — Inpatient Hospital Stay: Payer: 59 | Attending: Oncology

## 2021-03-07 VITALS — BP 130/85 | HR 71 | Temp 98.2°F | Resp 20 | Ht 68.0 in | Wt 196.4 lb

## 2021-03-07 DIAGNOSIS — C181 Malignant neoplasm of appendix: Secondary | ICD-10-CM

## 2021-03-07 DIAGNOSIS — Z85820 Personal history of malignant melanoma of skin: Secondary | ICD-10-CM | POA: Insufficient documentation

## 2021-03-07 LAB — CBC WITH DIFFERENTIAL (CANCER CENTER ONLY)
Abs Immature Granulocytes: 0.02 10*3/uL (ref 0.00–0.07)
Basophils Absolute: 0 10*3/uL (ref 0.0–0.1)
Basophils Relative: 0 %
Eosinophils Absolute: 0.1 10*3/uL (ref 0.0–0.5)
Eosinophils Relative: 2 %
HCT: 42.2 % (ref 39.0–52.0)
Hemoglobin: 13.9 g/dL (ref 13.0–17.0)
Immature Granulocytes: 0 %
Lymphocytes Relative: 26 %
Lymphs Abs: 1.5 10*3/uL (ref 0.7–4.0)
MCH: 28.7 pg (ref 26.0–34.0)
MCHC: 32.9 g/dL (ref 30.0–36.0)
MCV: 87 fL (ref 80.0–100.0)
Monocytes Absolute: 0.5 10*3/uL (ref 0.1–1.0)
Monocytes Relative: 9 %
Neutro Abs: 3.5 10*3/uL (ref 1.7–7.7)
Neutrophils Relative %: 63 %
Platelet Count: 209 10*3/uL (ref 150–400)
RBC: 4.85 MIL/uL (ref 4.22–5.81)
RDW: 15.5 % (ref 11.5–15.5)
WBC Count: 5.6 10*3/uL (ref 4.0–10.5)
nRBC: 0 % (ref 0.0–0.2)

## 2021-03-07 LAB — CMP (CANCER CENTER ONLY)
ALT: 19 U/L (ref 0–44)
AST: 17 U/L (ref 15–41)
Albumin: 3.9 g/dL (ref 3.5–5.0)
Alkaline Phosphatase: 46 U/L (ref 38–126)
Anion gap: 7 (ref 5–15)
BUN: 12 mg/dL (ref 6–20)
CO2: 27 mmol/L (ref 22–32)
Calcium: 8.7 mg/dL — ABNORMAL LOW (ref 8.9–10.3)
Chloride: 104 mmol/L (ref 98–111)
Creatinine: 0.67 mg/dL (ref 0.61–1.24)
GFR, Estimated: >60 mL/min (ref >60)
Glucose, Bld: 119 mg/dL — ABNORMAL HIGH (ref 70–99)
Potassium: 3.9 mmol/L (ref 3.5–5.1)
Sodium: 138 mmol/L (ref 135–145)
Total Bilirubin: 0.4 mg/dL (ref 0.3–1.2)
Total Protein: 6.7 g/dL (ref 6.5–8.1)

## 2021-03-07 NOTE — Progress Notes (Signed)
  [place] OFFICE PROGRESS NOTE   Diagnosis: Appendix carcinoma  INTERVAL HISTORY:   Mr. Goette completed another cycle of Xeloda beginning 02/18/2021.  No mouth sores, diarrhea, or hand/foot pain.  He had less fatigue following this cycle of chemotherapy.  He plans to return to work on 03/11/2021.  Objective:  Vital signs in last 24 hours:  Blood pressure 130/85, pulse 71, temperature 98.2 F (36.8 C), temperature source Oral, resp. rate 20, height '5\' 8"'$  (1.727 m), weight 196 lb 6.4 oz (89.1 kg), SpO2 100 %.    HEENT: No thrush or ulcers Resp: Lungs clear bilaterally Cardio: Regular rate and rhythm GI: No hepatosplenomegaly Vascular: No leg edema  Skin: Dryness of the palms and soles, no erythema or skin breakdown   Lab Results:  Lab Results  Component Value Date   WBC 5.6 03/07/2021   HGB 13.9 03/07/2021   HCT 42.2 03/07/2021   MCV 87.0 03/07/2021   PLT 209 03/07/2021   NEUTROABS 3.5 03/07/2021    CMP  Lab Results  Component Value Date   NA 138 03/07/2021   K 3.9 03/07/2021   CL 104 03/07/2021   CO2 27 03/07/2021   GLUCOSE 119 (H) 03/07/2021   BUN 12 03/07/2021   CREATININE 0.67 03/07/2021   CALCIUM 8.7 (L) 03/07/2021   PROT 6.7 03/07/2021   ALBUMIN 3.9 03/07/2021   AST 17 03/07/2021   ALT 19 03/07/2021   ALKPHOS 46 03/07/2021   BILITOT 0.4 03/07/2021   GFRNONAA >60 03/07/2021    Lab Results  Component Value Date   CEA1 1.6 10/04/2020     Medications: I have reviewed the patient's current medications.   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  Grade 1.  Proximal resection margin positive.  Focal disruption in the mid segment noted CT abdomen/pelvis [id number]- acute uncomplicated appendicitis CT chest 10/03/2020- no suspicious pulmonary nodules, no evidence  of metastatic disease Colonoscopy 11/27/2020-normal appendiceal orifice, multiple polyps removed from the colon Right colectomy 11/28/2020- no evidence of metastatic disease, no residual goblet cell carcinoma, 23 negative lymph nodes Cycle 1 Xeloda 01/28/2021 Cycle 2 Xeloda 02/18/2021 Cycle 3 Xeloda 03/11/2021 Post appendectomy fluid collection- drained by interventional radiology 10/03/2020, negative cytology Colonoscopy 11/27/2020, sessile serrated polyps, tubular adenomas, and hyperplastic polyps Melanoma, 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  He has tolerated the treatment well.  He will complete cycle 3 beginning 03/11/2021.  He will return for an office and lab visit in 3 weeks.  He plans to return to work on 03/11/2021.    Betsy Coder, MD  03/07/2021  11:22 AM

## 2021-03-08 ENCOUNTER — Encounter: Payer: Self-pay | Admitting: *Deleted

## 2021-03-08 NOTE — Progress Notes (Signed)
Faxed requested office notes from 01/16/21 to 03/06/21 w/labs, medication list, and path report of 11/28/20 to the [facility] [id number] per patient request. Att: Clodzie Dominque

## 2021-03-15 ENCOUNTER — Inpatient Hospital Stay: Payer: 59

## 2021-03-15 ENCOUNTER — Inpatient Hospital Stay: Payer: 59 | Admitting: Oncology

## 2021-03-28 ENCOUNTER — Inpatient Hospital Stay: Payer: 59 | Attending: Oncology

## 2021-03-28 ENCOUNTER — Other Ambulatory Visit: Payer: Self-pay

## 2021-03-28 ENCOUNTER — Inpatient Hospital Stay (HOSPITAL_BASED_OUTPATIENT_CLINIC_OR_DEPARTMENT_OTHER): Payer: 59 | Admitting: Oncology

## 2021-03-28 VITALS — BP 144/94 | HR 62 | Temp 98.5°F | Resp 18 | Wt 196.8 lb

## 2021-03-28 DIAGNOSIS — C181 Malignant neoplasm of appendix: Secondary | ICD-10-CM | POA: Diagnosis present

## 2021-03-28 DIAGNOSIS — K3580 Unspecified acute appendicitis: Secondary | ICD-10-CM | POA: Diagnosis not present

## 2021-03-28 DIAGNOSIS — L271 Localized skin eruption due to drugs and medicaments taken internally: Secondary | ICD-10-CM | POA: Insufficient documentation

## 2021-03-28 DIAGNOSIS — Z85820 Personal history of malignant melanoma of skin: Secondary | ICD-10-CM | POA: Insufficient documentation

## 2021-03-28 DIAGNOSIS — Z79899 Other long term (current) drug therapy: Secondary | ICD-10-CM | POA: Diagnosis not present

## 2021-03-28 DIAGNOSIS — Z902 Acquired absence of lung [part of]: Secondary | ICD-10-CM | POA: Insufficient documentation

## 2021-03-28 DIAGNOSIS — Z9049 Acquired absence of other specified parts of digestive tract: Secondary | ICD-10-CM | POA: Diagnosis not present

## 2021-03-28 LAB — CBC WITH DIFFERENTIAL (CANCER CENTER ONLY)
Abs Immature Granulocytes: 0.01 10*3/uL (ref 0.00–0.07)
Basophils Absolute: 0 10*3/uL (ref 0.0–0.1)
Basophils Relative: 1 %
Eosinophils Absolute: 0.1 10*3/uL (ref 0.0–0.5)
Eosinophils Relative: 2 %
HCT: 41.6 % (ref 39.0–52.0)
Hemoglobin: 13.9 g/dL (ref 13.0–17.0)
Immature Granulocytes: 0 %
Lymphocytes Relative: 29 %
Lymphs Abs: 1.7 10*3/uL (ref 0.7–4.0)
MCH: 29 pg (ref 26.0–34.0)
MCHC: 33.4 g/dL (ref 30.0–36.0)
MCV: 86.7 fL (ref 80.0–100.0)
Monocytes Absolute: 0.6 10*3/uL (ref 0.1–1.0)
Monocytes Relative: 10 %
Neutro Abs: 3.4 10*3/uL (ref 1.7–7.7)
Neutrophils Relative %: 58 %
Platelet Count: 213 10*3/uL (ref 150–400)
RBC: 4.8 MIL/uL (ref 4.22–5.81)
RDW: 17.2 % — ABNORMAL HIGH (ref 11.5–15.5)
WBC Count: 5.8 10*3/uL (ref 4.0–10.5)
nRBC: 0 % (ref 0.0–0.2)

## 2021-03-28 LAB — CMP (CANCER CENTER ONLY)
ALT: 19 U/L (ref 0–44)
AST: 17 U/L (ref 15–41)
Albumin: 4.4 g/dL (ref 3.5–5.0)
Alkaline Phosphatase: 45 U/L (ref 38–126)
Anion gap: 8 (ref 5–15)
BUN: 15 mg/dL (ref 6–20)
CO2: 27 mmol/L (ref 22–32)
Calcium: 9 mg/dL (ref 8.9–10.3)
Chloride: 102 mmol/L (ref 98–111)
Creatinine: 0.76 mg/dL (ref 0.61–1.24)
GFR, Estimated: >60 mL/min (ref >60)
Glucose, Bld: 90 mg/dL (ref 70–99)
Potassium: 4.3 mmol/L (ref 3.5–5.1)
Sodium: 137 mmol/L (ref 135–145)
Total Bilirubin: 0.6 mg/dL (ref 0.3–1.2)
Total Protein: 7.3 g/dL (ref 6.5–8.1)

## 2021-03-28 NOTE — Progress Notes (Signed)
  [place] OFFICE PROGRESS NOTE   Diagnosis: Appendix carcinoma  INTERVAL HISTORY:   Sean [name] returns as scheduled.  He completed another cycle of Xeloda beginning 03/11/2021.  No mouth sores, nausea, or diarrhea.  He reports mild tenderness at the palms and soles.  He is working.  Objective:  Vital signs in last 24 hours:  Blood pressure (!) 144/94, pulse 62, temperature 98.5 F (36.9 C), temperature source Oral, resp. rate 18, weight 196 lb 12.8 oz (89.3 kg), SpO2 98 %.    HEENT: No thrush or ulcers Resp: Lungs clear bilaterally Cardio: Regular rate and rhythm GI: No hepatosplenomegaly, no mass, no apparent ascites Vascular: No leg edema  Skin: Minus at the soles, mild skin thi  Lab Results:  Lab Results  Component Value Date   WBC 5.8 03/28/2021   HGB 13.9 03/28/2021   HCT 41.6 03/28/2021   MCV 86.7 03/28/2021   PLT 213 03/28/2021   NEUTROABS 3.4 03/28/2021    CMP  Lab Results  Component Value Date   NA 138 03/07/2021   K 3.9 03/07/2021   CL 104 03/07/2021   CO2 27 03/07/2021   GLUCOSE 119 (H) 03/07/2021   BUN 12 03/07/2021   CREATININE 0.67 03/07/2021   CALCIUM 8.7 (L) 03/07/2021   PROT 6.7 03/07/2021   ALBUMIN 3.9 03/07/2021   AST 17 03/07/2021   ALT 19 03/07/2021   ALKPHOS 46 03/07/2021   BILITOT 0.4 03/07/2021   GFRNONAA >60 03/07/2021    Lab Results  Component Value Date   CEA1 1.6 10/04/2020     Medications: I have reviewed the patient's current medications.   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  Grade 1.  Proximal resection margin positive.  Focal disruption in the mid segment noted CT abdomen/pelvis [id number]- acute uncomplicated appendicitis CT chest 10/03/2020- no suspicious pulmonary nodules, no evidence of metastatic disease Colonoscopy  11/27/2020-normal appendiceal orifice, multiple polyps removed from the colon Right colectomy 11/28/2020- no evidence of metastatic disease, no residual goblet cell carcinoma, 23 negative lymph nodes Cycle 1 Xeloda 01/28/2021 Cycle 2 Xeloda 02/18/2021 Cycle 3 Xeloda 03/11/2021 Cycle 4 Xeloda 04/01/2021 Post appendectomy fluid collection- drained by interventional radiology 10/03/2020, negative cytology Colonoscopy 11/27/2020, sessile serrated polyps, tubular adenomas, and hyperplastic polyps Melanoma, 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  He has tolerated the treatment well.  He has early symptoms of hand/foot syndrome.  He will call for progressive hand/foot symptoms.  He will begin another cycle of Xeloda on 04/01/2021.  He will return for an office and lab visit in 3 weeks.  Betsy Coder, MD  03/28/2021  3:34 PM

## 2021-04-12 ENCOUNTER — Other Ambulatory Visit: Payer: Self-pay | Admitting: *Deleted

## 2021-04-12 DIAGNOSIS — C180 Malignant neoplasm of cecum: Secondary | ICD-10-CM

## 2021-04-12 MED ORDER — CAPECITABINE 500 MG PO TABS: ORAL_TABLET | ORAL | 0 refills | Status: DC

## 2021-04-18 ENCOUNTER — Inpatient Hospital Stay (HOSPITAL_BASED_OUTPATIENT_CLINIC_OR_DEPARTMENT_OTHER): Payer: 59 | Admitting: Oncology

## 2021-04-18 ENCOUNTER — Other Ambulatory Visit: Payer: Self-pay

## 2021-04-18 ENCOUNTER — Inpatient Hospital Stay: Payer: 59

## 2021-04-18 VITALS — BP 119/85 | HR 60 | Temp 98.1°F | Resp 18 | Ht 68.0 in | Wt 195.8 lb

## 2021-04-18 DIAGNOSIS — C180 Malignant neoplasm of cecum: Secondary | ICD-10-CM

## 2021-04-18 DIAGNOSIS — C181 Malignant neoplasm of appendix: Secondary | ICD-10-CM

## 2021-04-18 LAB — CBC WITH DIFFERENTIAL (CANCER CENTER ONLY)
Abs Immature Granulocytes: 0.01 10*3/uL (ref 0.00–0.07)
Basophils Absolute: 0 10*3/uL (ref 0.0–0.1)
Basophils Relative: 1 %
Eosinophils Absolute: 0.1 10*3/uL (ref 0.0–0.5)
Eosinophils Relative: 1 %
HCT: 40.6 % (ref 39.0–52.0)
Hemoglobin: 14 g/dL (ref 13.0–17.0)
Immature Granulocytes: 0 %
Lymphocytes Relative: 29 %
Lymphs Abs: 1.6 10*3/uL (ref 0.7–4.0)
MCH: 29.8 pg (ref 26.0–34.0)
MCHC: 34.5 g/dL (ref 30.0–36.0)
MCV: 86.4 fL (ref 80.0–100.0)
Monocytes Absolute: 0.5 10*3/uL (ref 0.1–1.0)
Monocytes Relative: 9 %
Neutro Abs: 3.4 10*3/uL (ref 1.7–7.7)
Neutrophils Relative %: 60 %
Platelet Count: 204 10*3/uL (ref 150–400)
RBC: 4.7 MIL/uL (ref 4.22–5.81)
RDW: 16.8 % — ABNORMAL HIGH (ref 11.5–15.5)
WBC Count: 5.6 10*3/uL (ref 4.0–10.5)
nRBC: 0 % (ref 0.0–0.2)

## 2021-04-18 LAB — CMP (CANCER CENTER ONLY)
ALT: 21 U/L (ref 0–44)
AST: 16 U/L (ref 15–41)
Albumin: 4.2 g/dL (ref 3.5–5.0)
Alkaline Phosphatase: 45 U/L (ref 38–126)
Anion gap: 10 (ref 5–15)
BUN: 16 mg/dL (ref 6–20)
CO2: 26 mmol/L (ref 22–32)
Calcium: 9.3 mg/dL (ref 8.9–10.3)
Chloride: 103 mmol/L (ref 98–111)
Creatinine: 0.82 mg/dL (ref 0.61–1.24)
GFR, Estimated: >60 mL/min (ref >60)
Glucose, Bld: 106 mg/dL — ABNORMAL HIGH (ref 70–99)
Potassium: 4.2 mmol/L (ref 3.5–5.1)
Sodium: 139 mmol/L (ref 135–145)
Total Bilirubin: 0.5 mg/dL (ref 0.3–1.2)
Total Protein: 6.8 g/dL (ref 6.5–8.1)

## 2021-04-18 LAB — CEA (ACCESS): CEA (CHCC): 2.08 ng/mL (ref 0.00–5.00)

## 2021-04-18 NOTE — Progress Notes (Signed)
[place] OFFICE PROGRESS NOTE   Diagnosis: Appendix carcinoma  INTERVAL HISTORY:   Sean [name] returns as scheduled.  He completed another cycle of Xeloda beginning 04/01/2021.  He reports 1 sore at the right buccal mucosa.  No other mouth sores.  No nausea or diarrhea.  He has noted occasional "fluttering "of the heart.  No dyspnea or chest pain.  He has mild discomfort at the palms and soles.  He has dryness and cracking at the hands.  Objective:  Vital signs in last 24 hours:  Blood pressure 119/85, pulse 60, temperature 98.1 F (36.7 C), temperature source Oral, resp. rate 18, height 5\' 8"  (1.727 m), weight 195 lb 12.8 oz (88.8 kg), SpO2 98 %.    HEENT: Healing ulcer at the anterior right buccal mucosa, no thrush Resp: Lungs clear bilaterally Cardio: Regular rate and rhythm GI: No hepatosplenomegaly, no apparent ascites, nontender Vascular: No leg edema  Skin: Dryness of the palms and soles, few linear ulcers at the fingers  Portacath/PICC-without erythema  Lab Results:  Lab Results  Component Value Date   WBC 5.6 04/18/2021   HGB 14.0 04/18/2021   HCT 40.6 04/18/2021   MCV 86.4 04/18/2021   PLT 204 04/18/2021   NEUTROABS 3.4 04/18/2021    CMP  Lab Results  Component Value Date   NA 139 04/18/2021   K 4.2 04/18/2021   CL 103 04/18/2021   CO2 26 04/18/2021   GLUCOSE 106 (H) 04/18/2021   BUN 16 04/18/2021   CREATININE 0.82 04/18/2021   CALCIUM 9.3 04/18/2021   PROT 6.8 04/18/2021   ALBUMIN 4.2 04/18/2021   AST 16 04/18/2021   ALT 21 04/18/2021   ALKPHOS 45 04/18/2021   BILITOT 0.5 04/18/2021   GFRNONAA >60 04/18/2021    Lab Results  Component Value Date   CEA1 1.6 10/04/2020   CEA 2.08 04/18/2021    Medications: I have reviewed the patient's current medications.   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  Grade 1.  Proximal resection margin positive.  Focal disruption in the mid segment noted CT abdomen/pelvis 04/13/2682- acute uncomplicated appendicitis CT chest 10/03/2020- no suspicious pulmonary nodules, no evidence of metastatic disease Colonoscopy 11/27/2020-normal appendiceal orifice, multiple polyps removed from the colon Right colectomy 11/28/2020- no evidence of metastatic disease, no residual goblet cell carcinoma, 23 negative lymph nodes Cycle 1 Xeloda 01/28/2021 Cycle 2 Xeloda 02/18/2021 Cycle 3 Xeloda 03/11/2021 Cycle 4 Xeloda 04/01/2021 Cycle 5 Xeloda 04/22/2021 Post appendectomy fluid collection- drained by interventional radiology 10/03/2020, negative cytology Colonoscopy 11/27/2020, sessile serrated polyps, tubular adenomas, and hyperplastic polyps Melanoma, 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  He is tolerating the Xeloda well.  He will begin another cycle on 04/22/2021.  He has mild hand/foot syndrome.  He will use a moisturizer.  He will contact us for progressive hand/foot symptoms or mouth sores.  He will return for an office and lab visit in 3 weeks.  Betsy Coder, MD  04/18/2021  4:32 PM

## 2021-04-29 ENCOUNTER — Other Ambulatory Visit: Payer: Self-pay

## 2021-04-29 DIAGNOSIS — C180 Malignant neoplasm of cecum: Secondary | ICD-10-CM

## 2021-04-29 MED ORDER — CAPECITABINE 500 MG PO TABS: ORAL_TABLET | ORAL | 0 refills | Status: DC

## 2021-05-09 ENCOUNTER — Inpatient Hospital Stay: Payer: 59

## 2021-05-09 ENCOUNTER — Inpatient Hospital Stay: Payer: 59 | Attending: Oncology | Admitting: Nurse Practitioner

## 2021-05-09 ENCOUNTER — Other Ambulatory Visit: Payer: Self-pay

## 2021-05-09 ENCOUNTER — Encounter: Payer: Self-pay | Admitting: Nurse Practitioner

## 2021-05-09 VITALS — BP 131/85 | HR 62 | Temp 98.1°F | Resp 20 | Ht 68.0 in | Wt 197.6 lb

## 2021-05-09 DIAGNOSIS — C181 Malignant neoplasm of appendix: Secondary | ICD-10-CM

## 2021-05-09 LAB — CBC WITH DIFFERENTIAL (CANCER CENTER ONLY)
Abs Immature Granulocytes: 0.01 10*3/uL (ref 0.00–0.07)
Basophils Absolute: 0 10*3/uL (ref 0.0–0.1)
Basophils Relative: 1 %
Eosinophils Absolute: 0.1 10*3/uL (ref 0.0–0.5)
Eosinophils Relative: 2 %
HCT: 40.9 % (ref 39.0–52.0)
Hemoglobin: 14.3 g/dL (ref 13.0–17.0)
Immature Granulocytes: 0 %
Lymphocytes Relative: 29 %
Lymphs Abs: 1.5 10*3/uL (ref 0.7–4.0)
MCH: 31.1 pg (ref 26.0–34.0)
MCHC: 35 g/dL (ref 30.0–36.0)
MCV: 88.9 fL (ref 80.0–100.0)
Monocytes Absolute: 0.5 10*3/uL (ref 0.1–1.0)
Monocytes Relative: 9 %
Neutro Abs: 3 10*3/uL (ref 1.7–7.7)
Neutrophils Relative %: 59 %
Platelet Count: 211 10*3/uL (ref 150–400)
RBC: 4.6 MIL/uL (ref 4.22–5.81)
RDW: 16.9 % — ABNORMAL HIGH (ref 11.5–15.5)
WBC Count: 5.1 10*3/uL (ref 4.0–10.5)
nRBC: 0 % (ref 0.0–0.2)

## 2021-05-09 LAB — CMP (CANCER CENTER ONLY)
ALT: 23 U/L (ref 0–44)
AST: 19 U/L (ref 15–41)
Albumin: 4 g/dL (ref 3.5–5.0)
Alkaline Phosphatase: 43 U/L (ref 38–126)
Anion gap: 9 (ref 5–15)
BUN: 15 mg/dL (ref 6–20)
CO2: 26 mmol/L (ref 22–32)
Calcium: 8.8 mg/dL — ABNORMAL LOW (ref 8.9–10.3)
Chloride: 104 mmol/L (ref 98–111)
Creatinine: 0.82 mg/dL (ref 0.61–1.24)
GFR, Estimated: >60 mL/min (ref >60)
Glucose, Bld: 118 mg/dL — ABNORMAL HIGH (ref 70–99)
Potassium: 3.8 mmol/L (ref 3.5–5.1)
Sodium: 139 mmol/L (ref 135–145)
Total Bilirubin: 0.5 mg/dL (ref 0.3–1.2)
Total Protein: 7 g/dL (ref 6.5–8.1)

## 2021-05-09 NOTE — Progress Notes (Signed)
  Sean [name]   Diagnosis: Appendix carcinoma  INTERVAL HISTORY:   [name] returns as scheduled.  He completed cycle 5 Xeloda beginning 04/22/2021.  He denies nausea/vomiting.  No mouth sores.  No diarrhea.  No hand or foot pain or redness.  Only complaint is fatigue toward the end of the second week he takes Xeloda.  Objective:  Vital signs in last 24 hours:  Blood pressure 131/85, pulse 62, temperature 98.1 F (36.7 C), temperature source Oral, resp. rate 20, height 5\' 8"  (1.727 m), weight 197 lb 9.6 oz (89.6 kg), SpO2 99 %.    HEENT: No thrush or ulcers. Resp: Lungs clear bilaterally. Cardio: Regular rate and rhythm. GI: Abdomen soft and nontender.  No hepatomegaly. Vascular: No leg edema. Skin: Palms mildly dry appearing.  No erythema.   Lab Results:  Lab Results  Component Value Date   WBC 5.1 05/09/2021   HGB 14.3 05/09/2021   HCT 40.9 05/09/2021   MCV 88.9 05/09/2021   PLT 211 05/09/2021   NEUTROABS 3.0 05/09/2021    Imaging:  No results found.  Medications: I have reviewed the patient's current medications.  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  Grade 1.  Proximal resection margin positive.  Focal disruption in the mid segment noted CT abdomen/pelvis 9/37/1696- acute uncomplicated appendicitis CT chest 10/03/2020- no suspicious pulmonary nodules, no evidence of metastatic disease Colonoscopy 11/27/2020-normal appendiceal orifice, multiple polyps removed from the colon Right colectomy 11/28/2020- no evidence of metastatic disease, no residual goblet cell carcinoma, 23 negative lymph nodes Cycle 1 Xeloda 01/28/2021 Cycle 2 Xeloda 02/18/2021 Cycle 3 Xeloda 03/11/2021 Cycle 4 Xeloda 04/01/2021 Cycle 5 Xeloda 04/22/2021 Cycle 6 Xeloda 05/13/2021 Post  appendectomy fluid collection- drained by interventional radiology 10/03/2020, negative cytology Colonoscopy 11/27/2020, sessile serrated polyps, tubular adenomas, and hyperplastic polyps Melanoma, 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  He has completed 5 cycles of adjuvant Xeloda.  He continues to tolerate well.  Plan to proceed with cycle 6 beginning 05/13/2021.  CBC and chemistry panel reviewed.  Labs adequate to proceed with Xeloda as above.  He will return for lab and follow-up in 3 weeks.  He will contact the office in the interim with any problems.    Ned Card ANP/GNP-BC   05/09/2021  2:12 PM

## 2021-05-15 ENCOUNTER — Other Ambulatory Visit: Payer: Self-pay | Admitting: *Deleted

## 2021-05-15 DIAGNOSIS — C180 Malignant neoplasm of cecum: Secondary | ICD-10-CM

## 2021-05-15 MED ORDER — CAPECITABINE 500 MG PO TABS
ORAL_TABLET | ORAL | 0 refills | Status: DC
Start: 2021-05-15 — End: 2021-06-17

## 2021-05-30 ENCOUNTER — Inpatient Hospital Stay (HOSPITAL_BASED_OUTPATIENT_CLINIC_OR_DEPARTMENT_OTHER): Payer: 59 | Admitting: Nurse Practitioner

## 2021-05-30 ENCOUNTER — Other Ambulatory Visit: Payer: Self-pay

## 2021-05-30 ENCOUNTER — Encounter: Payer: Self-pay | Admitting: Nurse Practitioner

## 2021-05-30 ENCOUNTER — Inpatient Hospital Stay: Payer: 59 | Attending: Oncology

## 2021-05-30 VITALS — BP 136/84 | HR 77 | Temp 98.0°F | Resp 18 | Ht 68.0 in | Wt 198.6 lb

## 2021-05-30 DIAGNOSIS — C181 Malignant neoplasm of appendix: Secondary | ICD-10-CM

## 2021-05-30 DIAGNOSIS — Z85820 Personal history of malignant melanoma of skin: Secondary | ICD-10-CM | POA: Diagnosis not present

## 2021-05-30 LAB — CMP (CANCER CENTER ONLY)
ALT: 24 U/L (ref 0–44)
AST: 19 U/L (ref 15–41)
Albumin: 4.1 g/dL (ref 3.5–5.0)
Alkaline Phosphatase: 48 U/L (ref 38–126)
Anion gap: 8 (ref 5–15)
BUN: 16 mg/dL (ref 6–20)
CO2: 27 mmol/L (ref 22–32)
Calcium: 8.8 mg/dL — ABNORMAL LOW (ref 8.9–10.3)
Chloride: 102 mmol/L (ref 98–111)
Creatinine: 0.74 mg/dL (ref 0.61–1.24)
GFR, Estimated: >60 mL/min (ref >60)
Glucose, Bld: 106 mg/dL — ABNORMAL HIGH (ref 70–99)
Potassium: 3.9 mmol/L (ref 3.5–5.1)
Sodium: 137 mmol/L (ref 135–145)
Total Bilirubin: 0.6 mg/dL (ref 0.3–1.2)
Total Protein: 7.3 g/dL (ref 6.5–8.1)

## 2021-05-30 LAB — CBC WITH DIFFERENTIAL (CANCER CENTER ONLY)
Abs Immature Granulocytes: 0.01 10*3/uL (ref 0.00–0.07)
Basophils Absolute: 0 10*3/uL (ref 0.0–0.1)
Basophils Relative: 0 %
Eosinophils Absolute: 0.1 10*3/uL (ref 0.0–0.5)
Eosinophils Relative: 1 %
HCT: 41.1 % (ref 39.0–52.0)
Hemoglobin: 14.4 g/dL (ref 13.0–17.0)
Immature Granulocytes: 0 %
Lymphocytes Relative: 29 %
Lymphs Abs: 1.8 10*3/uL (ref 0.7–4.0)
MCH: 31.5 pg (ref 26.0–34.0)
MCHC: 35 g/dL (ref 30.0–36.0)
MCV: 89.9 fL (ref 80.0–100.0)
Monocytes Absolute: 0.6 10*3/uL (ref 0.1–1.0)
Monocytes Relative: 9 %
Neutro Abs: 3.8 10*3/uL (ref 1.7–7.7)
Neutrophils Relative %: 61 %
Platelet Count: 220 10*3/uL (ref 150–400)
RBC: 4.57 MIL/uL (ref 4.22–5.81)
RDW: 15.5 % (ref 11.5–15.5)
WBC Count: 6.3 10*3/uL (ref 4.0–10.5)
nRBC: 0 % (ref 0.0–0.2)

## 2021-05-30 NOTE — Progress Notes (Signed)
  [facility] OFFICE PROGRESS NOTE   Diagnosis: Appendix carcinoma  INTERVAL HISTORY:   [name] returns as scheduled.  He completed cycle 6 Xeloda beginning 05/13/2021.  He denies nausea/vomiting.  No mouth sores.  No diarrhea.  No significant hand or foot pain.  Objective:  Vital signs in last 24 hours:  Blood pressure 136/84, pulse 77, temperature 98 F (36.7 C), temperature source Oral, resp. rate 18, height 5\' 8"  (1.727 m), weight 198 lb 9.6 oz (90.1 kg), SpO2 98 %.    HEENT: No thrush or ulcers. Resp: Lungs clear bilaterally. Cardio: Regular rate and rhythm. GI: Abdomen soft and nontender.  No hepatomegaly. Vascular: No leg edema. Skin: Palms are dry appearing, mild erythema.  Soles without erythema.   Lab Results:  Lab Results  Component Value Date   WBC 6.3 05/30/2021   HGB 14.4 05/30/2021   HCT 41.1 05/30/2021   MCV 89.9 05/30/2021   PLT 220 05/30/2021   NEUTROABS 3.8 05/30/2021    Imaging:  No results found.  Medications: I have reviewed the patient's current medications.  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  Grade 1.  Proximal resection margin positive.  Focal disruption in the mid segment noted CT abdomen/pelvis 08/04/7260- acute uncomplicated appendicitis CT chest 10/03/2020- no suspicious pulmonary nodules, no evidence of metastatic disease Colonoscopy 11/27/2020-normal appendiceal orifice, multiple polyps removed from the colon Right colectomy 11/28/2020- no evidence of metastatic disease, no residual goblet cell carcinoma, 23 negative lymph nodes Cycle 1 Xeloda 01/28/2021 Cycle 2 Xeloda 02/18/2021 Cycle 3 Xeloda 03/11/2021 Cycle 4 Xeloda 04/01/2021 Cycle 5 Xeloda 04/22/2021 Cycle 6 Xeloda 05/13/2021 Post appendectomy fluid collection- drained by interventional  radiology 10/03/2020, negative cytology Colonoscopy 11/27/2020, sessile serrated polyps, tubular adenomas, and hyperplastic polyps Melanoma, left arm November 1989, Clark's level 3, Breslow depth 1.0 Recurrent left upper arm February 1993-excised Recurrence left back, left lower lung (removed in 1995), solitary brain lesion resected in September 1996, status post treatment with the "[place] "regimen, measurable disease in the right lung-chemotherapy ineffective, right lung lesion removed November 1996 Resection of small bowel lesion in February 1997 and a mesenteric lesion in 1998  Disposition: Mr. Copley appears stable.  He has completed 6 cycles of adjuvant Xeloda.  Plan to proceed with cycle 7 as scheduled beginning 06/03/2021.    Palms with mild changes of hand-foot syndrome.  He understands to stop Xeloda and contact the office if symptoms worsen.  CBC reviewed.  Labs adequate to proceed as above.  He will return for lab and follow-up in 3 weeks.  We are available to see him sooner if needed.    Ned Card ANP/GNP-BC   05/30/2021  3:30 PM

## 2021-06-17 ENCOUNTER — Other Ambulatory Visit: Payer: Self-pay

## 2021-06-17 DIAGNOSIS — C180 Malignant neoplasm of cecum: Secondary | ICD-10-CM

## 2021-06-17 MED ORDER — CAPECITABINE 500 MG PO TABS: ORAL_TABLET | ORAL | 0 refills | Status: DC

## 2021-06-20 ENCOUNTER — Inpatient Hospital Stay (HOSPITAL_BASED_OUTPATIENT_CLINIC_OR_DEPARTMENT_OTHER): Payer: 59 | Admitting: Oncology

## 2021-06-20 ENCOUNTER — Inpatient Hospital Stay: Payer: 59 | Admitting: Oncology

## 2021-06-20 ENCOUNTER — Encounter: Payer: Self-pay | Admitting: Oncology

## 2021-06-20 ENCOUNTER — Other Ambulatory Visit: Payer: Self-pay

## 2021-06-20 ENCOUNTER — Inpatient Hospital Stay: Payer: 59 | Attending: Oncology

## 2021-06-20 ENCOUNTER — Inpatient Hospital Stay: Payer: 59

## 2021-06-20 VITALS — BP 135/83 | HR 60 | Temp 98.7°F | Resp 20 | Ht 68.0 in | Wt 202.0 lb

## 2021-06-20 DIAGNOSIS — C181 Malignant neoplasm of appendix: Secondary | ICD-10-CM | POA: Diagnosis not present

## 2021-06-20 DIAGNOSIS — Z85820 Personal history of malignant melanoma of skin: Secondary | ICD-10-CM | POA: Diagnosis not present

## 2021-06-20 LAB — CMP (CANCER CENTER ONLY)
ALT: 33 U/L (ref 0–44)
AST: 24 U/L (ref 15–41)
Albumin: 4.4 g/dL (ref 3.5–5.0)
Alkaline Phosphatase: 41 U/L (ref 38–126)
Anion gap: 8 (ref 5–15)
BUN: 14 mg/dL (ref 6–20)
CO2: 26 mmol/L (ref 22–32)
Calcium: 9.2 mg/dL (ref 8.9–10.3)
Chloride: 104 mmol/L (ref 98–111)
Creatinine: 0.79 mg/dL (ref 0.61–1.24)
GFR, Estimated: >60 mL/min (ref >60)
Glucose, Bld: 112 mg/dL — ABNORMAL HIGH (ref 70–99)
Potassium: 4.5 mmol/L (ref 3.5–5.1)
Sodium: 138 mmol/L (ref 135–145)
Total Bilirubin: 1 mg/dL (ref 0.3–1.2)
Total Protein: 7 g/dL (ref 6.5–8.1)

## 2021-06-20 LAB — CBC WITH DIFFERENTIAL (CANCER CENTER ONLY)
Abs Immature Granulocytes: 0.02 10*3/uL (ref 0.00–0.07)
Basophils Absolute: 0 10*3/uL (ref 0.0–0.1)
Basophils Relative: 1 %
Eosinophils Absolute: 0.1 10*3/uL (ref 0.0–0.5)
Eosinophils Relative: 2 %
HCT: 43.1 % (ref 39.0–52.0)
Hemoglobin: 15.2 g/dL (ref 13.0–17.0)
Immature Granulocytes: 0 %
Lymphocytes Relative: 28 %
Lymphs Abs: 1.6 10*3/uL (ref 0.7–4.0)
MCH: 32.9 pg (ref 26.0–34.0)
MCHC: 35.3 g/dL (ref 30.0–36.0)
MCV: 93.3 fL (ref 80.0–100.0)
Monocytes Absolute: 0.6 10*3/uL (ref 0.1–1.0)
Monocytes Relative: 10 %
Neutro Abs: 3.4 10*3/uL (ref 1.7–7.7)
Neutrophils Relative %: 59 %
Platelet Count: 208 10*3/uL (ref 150–400)
RBC: 4.62 MIL/uL (ref 4.22–5.81)
RDW: 14.7 % (ref 11.5–15.5)
WBC Count: 5.7 10*3/uL (ref 4.0–10.5)
nRBC: 0 % (ref 0.0–0.2)

## 2021-06-20 NOTE — Progress Notes (Signed)
  [facility] OFFICE PROGRESS NOTE   Diagnosis: Goblet cell adenocarcinoma the appendix  INTERVAL HISTORY:   Sean [name] returns as scheduled.  He began another cycle of Xeloda on 06/03/2021.  No mouth sores, nausea, diarrhea, or hand/foot pain.  No complaint.  He is working.  Objective:  Vital signs in last 24 hours:  Blood pressure 135/83, pulse 60, temperature 98.7 F (37.1 C), resp. rate 20, height 5\' 8"  (1.727 m), weight 202 lb (91.6 kg), SpO2 100 %.    HEENT: No thrush or ulcers Lymphatics: No cervical, supraclavicular, axillary, or inguinal nodes Resp: Lungs clear bilaterally Cardio: Regular rate and rhythm GI: No hepatosplenomegaly, no mass, nontender Vascular: No leg edema  Skin: Dryness of the hands, callus formation at the soles, no erythema or skin breakdown   Lab Results:  Lab Results  Component Value Date   WBC 5.7 06/20/2021   HGB 15.2 06/20/2021   HCT 43.1 06/20/2021   MCV 93.3 06/20/2021   PLT 208 06/20/2021   NEUTROABS 3.4 06/20/2021    CMP  Lab Results  Component Value Date   NA 137 05/30/2021   K 3.9 05/30/2021   CL 102 05/30/2021   CO2 27 05/30/2021   GLUCOSE 106 (H) 05/30/2021   BUN 16 05/30/2021   CREATININE 0.74 05/30/2021   CALCIUM 8.8 (L) 05/30/2021   PROT 7.3 05/30/2021   ALBUMIN 4.1 05/30/2021   AST 19 05/30/2021   ALT 24 05/30/2021   ALKPHOS 48 05/30/2021   BILITOT 0.6 05/30/2021   GFRNONAA >60 05/30/2021    Lab Results  Component Value Date   CEA1 1.6 10/04/2020   CEA 2.08 04/18/2021     Medications: I have reviewed the patient's current medications.   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  Grade 1.  Proximal resection margin positive.  Focal disruption in the mid segment noted CT abdomen/pelvis 0/96/2836- acute  uncomplicated appendicitis CT chest 10/03/2020- no suspicious pulmonary nodules, no evidence of metastatic disease Colonoscopy 11/27/2020-normal appendiceal orifice, multiple polyps removed from the colon Right colectomy 11/28/2020- no evidence of metastatic disease, no residual goblet cell carcinoma, 23 negative lymph nodes Cycle 1 Xeloda 01/28/2021 Cycle 2 Xeloda 02/18/2021 Cycle 3 Xeloda 03/11/2021 Cycle 4 Xeloda 04/01/2021 Cycle 5 Xeloda 04/22/2021 Cycle 6 Xeloda 05/13/2021 Cycle 7 Xeloda 06/03/2021 Cycle 8 Xeloda 06/24/2021 Post appendectomy fluid collection- drained by interventional radiology 10/03/2020, negative cytology Colonoscopy 11/27/2020, sessile serrated polyps, tubular adenomas, and hyperplastic polyps Melanoma, left arm November 1989, Clark's level 3, Breslow depth 1.0 Recurrent left upper arm February 1993-excised Recurrence left back, left lower lung (removed in 1995), solitary brain lesion resected in September 1996, status post treatment with the "[place] "regimen, measurable disease in the right lung-chemotherapy ineffective, right lung lesion removed November 1996 Resection of small bowel lesion in February 1997 and a mesenteric lesion in 1998   Disposition: Mr. Muto remains in clinical remission from the appendiceal carcinoma.  He will complete a final cycle of adjuvant Xeloda beginning 06/24/2021.  He will return for an office visit, CEA, and surveillance CTs in 3 months.  He will call in the interim for new symptoms.  Betsy Coder, MD  06/20/2021  8:44 AM

## 2021-07-09 ENCOUNTER — Telehealth (INDEPENDENT_AMBULATORY_CARE_PROVIDER_SITE_OTHER): Payer: 59 | Admitting: Family Medicine

## 2021-07-09 ENCOUNTER — Telehealth: Payer: 59 | Admitting: Internal Medicine

## 2021-07-09 ENCOUNTER — Encounter: Payer: Self-pay | Admitting: Family Medicine

## 2021-07-09 ENCOUNTER — Other Ambulatory Visit: Payer: Self-pay

## 2021-07-09 VITALS — Wt 195.0 lb

## 2021-07-09 DIAGNOSIS — J029 Acute pharyngitis, unspecified: Secondary | ICD-10-CM

## 2021-07-09 DIAGNOSIS — C181 Malignant neoplasm of appendix: Secondary | ICD-10-CM

## 2021-07-09 DIAGNOSIS — Z9221 Personal history of antineoplastic chemotherapy: Secondary | ICD-10-CM | POA: Diagnosis not present

## 2021-07-09 NOTE — Progress Notes (Signed)
[facility] [address] [place] [address] Dept: [phone] Dept Fax: [phone]  Virtual Video Visit  I connected with Sean [name] on 07/09/21 at 10:30 AM EST by a video enabled telemedicine application and verified that I am speaking with the correct person using two identifiers.  Location patient: Home Location provider: Clinic Persons participating in the virtual visit: Patient, Provider  I discussed the limitations of evaluation and management by telemedicine and the availability of in person appointments. The patient expressed understanding and agreed to proceed.  Chief Complaint  Patient presents with   Acute Visit    C/o having cold feet/chills, HA, fever since last night.  Finished Chemo yesterday.  He has taken Tylenol with some relief.      SUBJECTIVE:  HPI: Sean [name] is a 60 y.o. male who presents with a complaint of fever/chills, headache and sore throat. He notes his throat felt a bit scratchy on Sunday. By last evening, the sore throat had worsened and he was feeling chilled. His temperature was as high as 101 F. He has been taking Tylenol and today he does not feel febrile. Sean [name] has a history of an appendiceal goblet cell tumor. Yesterday, he took the last dose of round #8 of capecitabine (Xeloda). He has not had adverse reactions with is previous rounds. He has not tested himself for COVID, has not previously had COVID, and has not received the COVID vaccine. He called his oncologist office, but has not heard back from them.  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  iliocolonic anastomosis.   COLONOSCOPY  2009; 07/2016   Dr. Wynetta Emery at [facility]; next colonoscopy due 07/2019.   COLONOSCOPY WITH PROPOFOL N/A 08/18/2016   Tubular adenoma.  Recall 3 yrs.  Procedure: COLONOSCOPY WITH PROPOFOL;  Surgeon: Garlan Fair, MD;  Location: [facility];  Service: Endoscopy;  Laterality: N/A;   IR SINUS/FIST TUBE CHK-NON GI  10/08/2020   LAPAROSCOPIC APPENDECTOMY N/A 09/28/2020   Procedure: APPENDECTOMY LAPAROSCOPIC;  Surgeon: Erroll Luna, MD;  Location: [facility];  Service: General;  Laterality: N/A;   LUNG SURGERY     "              "          "        "   [facility]   x2 from melanoma   Family History  Problem Relation Age of Onset   Breast cancer Mother 45  metastatic   Alcohol abuse Father    Thyroid cancer Sister 40   Colon cancer Neg Hx    Esophageal cancer Neg Hx    Stomach cancer Neg Hx    Rectal cancer Neg Hx    Social History   Tobacco Use   Smoking status: Never   Smokeless tobacco: Former    Quit date: 2021  Vaping Use   Vaping Use: Never used  Substance Use Topics   Alcohol use: Not Currently    Comment: 1 drink/ 2 weeks   Drug use: No    Current Outpatient Medications:    Cholecalciferol (VITAMIN D3) 50 MCG (2000 UT) TABS, Take 2,000 Units by mouth in the morning., Disp: , Rfl:    capecitabine (XELODA) 500 MG tablet, Take 4 tablets  by mouth in the morning and 3 tablets by mouth in the evening with food for 14 days on, then 7 day rest and repeat. Start date 06/03/2021 (Patient not taking: Reported on 07/09/2021), Disp: 98 tablet, Rfl: 0  Allergies  Allergen Reactions   Aspirin Nausea And Vomiting   Penicillins Other (See Comments)    Childhood  Has patient had a PCN reaction causing immediate rash, facial/tongue/throat swelling, SOB or lightheadedness with hypotension: unknown Has patient had a PCN reaction causing severe rash involving mucus membranes or skin necrosis: {unkjnown Has patient had a PCN reaction that required hospitalization {no Has patient had a PCN reaction occurring within the last 10 years: no If all of the above answers are "NO", then may proceed with Cephalosporin use.   ROS: See pertinent positives and negatives per HPI.  OBSERVATIONS/OBJECTIVE:  VITALS per patient if applicable: Today's Vitals   07/09/21 1030  Weight: 195 lb (88.5 kg)   Body mass index is 29.65 kg/m.   GENERAL: Alert and oriented. Appears well and in no acute distress.  HEENT: Atraumatic. Conjunctiva clear. No obvious abnormalities on inspection of external nose and ears.  NECK: Normal movements of the head and neck.  LUNGS: On inspection, no signs of respiratory distress. Breathing rate appears normal. No obvious gross SOB, gasping or wheezing, and no conversational dyspnea.  CV: No obvious cyanosis.  MS: Moves all visible extremities without noticeable abnormality.  PSYCH/NEURO: Pleasant and cooperative. No obvious depression or anxiety. Speech and thought processing grossly intact.  ASSESSMENT AND PLAN:  1. Acute viral pharyngitis Discussed home care for viral illness, including rest, pushing fluids, and OTC medications as needed for symptom relief. Recommend hot tea with honey for sore throat symptoms. Follow-up if needed for worsening or persistent symptoms.  2. Status post chemotherapy, time since less  than 4 weeks 3. Cancer of appendix [facility]) I reviewed prior oncology notes and recent CBC and CMP results. Although Xeloda can cause fever, the constellation of other symptoms suggest this is a viral illness and not a drug-fever or more serious infectious illness. I do recommend that Mr. Bosher persists in informing Dr. Gearldine Shown office.   I discussed the assessment and treatment plan with the patient. The patient was provided an opportunity to ask questions and all were answered. The patient agreed with the plan and demonstrated an understanding of the instructions.   The patient was advised to call back or seek an in-person evaluation if the symptoms worsen or if the condition fails to improve as anticipated.   Haydee Salter, MD

## 2021-07-10 ENCOUNTER — Telehealth: Payer: Self-pay

## 2021-07-10 ENCOUNTER — Other Ambulatory Visit: Payer: Self-pay

## 2021-07-10 NOTE — Telephone Encounter (Signed)
TC from Pt stating he was feeling bad and took a test for Covid and the results came back positive today. Pt stated they gave him paxlovid and wanted to know if its safe to take. Asked if he had finished his Xeloda Pt stated he finished.Informed Pt it is safe for him to take the medication. Pt verbalized understanding.

## 2021-08-22 ENCOUNTER — Telehealth: Payer: Self-pay | Admitting: Oncology

## 2021-08-30 NOTE — Telephone Encounter (Signed)
left message for patient to let me know what day he can come,and left the number for

## 2021-09-18 ENCOUNTER — Ambulatory Visit (HOSPITAL_BASED_OUTPATIENT_CLINIC_OR_DEPARTMENT_OTHER): Payer: 59

## 2021-09-18 ENCOUNTER — Other Ambulatory Visit: Payer: 59

## 2021-09-20 ENCOUNTER — Ambulatory Visit: Payer: 59 | Admitting: Oncology

## 2021-10-01 ENCOUNTER — Ambulatory Visit (HOSPITAL_BASED_OUTPATIENT_CLINIC_OR_DEPARTMENT_OTHER)
Admission: RE | Admit: 2021-10-01 | Discharge: 2021-10-01 | Disposition: A | Discharge status: Home / Self Care | Payer: Managed Care, Other (non HMO) | Source: Ambulatory Visit | Attending: Oncology | Admitting: Oncology

## 2021-10-01 ENCOUNTER — Other Ambulatory Visit: Payer: Self-pay

## 2021-10-01 ENCOUNTER — Inpatient Hospital Stay: Payer: Managed Care, Other (non HMO) | Attending: Oncology

## 2021-10-01 DIAGNOSIS — C181 Malignant neoplasm of appendix: Secondary | ICD-10-CM | POA: Insufficient documentation

## 2021-10-01 LAB — CBC WITH DIFFERENTIAL (CANCER CENTER ONLY)
Abs Immature Granulocytes: 0.01 10*3/uL (ref 0.00–0.07)
Basophils Absolute: 0 10*3/uL (ref 0.0–0.1)
Basophils Relative: 0 %
Eosinophils Absolute: 0.1 10*3/uL (ref 0.0–0.5)
Eosinophils Relative: 2 %
HCT: 44.8 % (ref 39.0–52.0)
Hemoglobin: 15.2 g/dL (ref 13.0–17.0)
Immature Granulocytes: 0 %
Lymphocytes Relative: 30 %
Lymphs Abs: 1.6 10*3/uL (ref 0.7–4.0)
MCH: 30.4 pg (ref 26.0–34.0)
MCHC: 33.9 g/dL (ref 30.0–36.0)
MCV: 89.6 fL (ref 80.0–100.0)
Monocytes Absolute: 0.5 10*3/uL (ref 0.1–1.0)
Monocytes Relative: 9 %
Neutro Abs: 3.2 10*3/uL (ref 1.7–7.7)
Neutrophils Relative %: 59 %
Platelet Count: 214 10*3/uL (ref 150–400)
RBC: 5 MIL/uL (ref 4.22–5.81)
RDW: 11.9 % (ref 11.5–15.5)
WBC Count: 5.4 10*3/uL (ref 4.0–10.5)
nRBC: 0 % (ref 0.0–0.2)

## 2021-10-01 LAB — CMP (CANCER CENTER ONLY)
ALT: 29 U/L (ref 0–44)
AST: 18 U/L (ref 15–41)
Albumin: 4.5 g/dL (ref 3.5–5.0)
Alkaline Phosphatase: 43 U/L (ref 38–126)
Anion gap: 7 (ref 5–15)
BUN: 19 mg/dL (ref 6–20)
CO2: 28 mmol/L (ref 22–32)
Calcium: 9.4 mg/dL (ref 8.9–10.3)
Chloride: 102 mmol/L (ref 98–111)
Creatinine: 0.87 mg/dL (ref 0.61–1.24)
GFR, Estimated: >60 mL/min (ref >60)
Glucose, Bld: 113 mg/dL — ABNORMAL HIGH (ref 70–99)
Potassium: 4.4 mmol/L (ref 3.5–5.1)
Sodium: 137 mmol/L (ref 135–145)
Total Bilirubin: 0.5 mg/dL (ref 0.3–1.2)
Total Protein: 7.4 g/dL (ref 6.5–8.1)

## 2021-10-01 LAB — CEA (ACCESS): CEA (CHCC): 2.13 ng/mL (ref 0.00–5.00)

## 2021-10-01 MED ORDER — IOHEXOL 300 MG/ML  SOLN
100.0000 mL | Freq: Once | INTRAMUSCULAR | Status: AC | PRN
  Administered 2021-10-01: 100 mL via INTRAVENOUS

## 2021-10-03 ENCOUNTER — Other Ambulatory Visit: Payer: Self-pay

## 2021-10-03 ENCOUNTER — Ambulatory Visit (INDEPENDENT_AMBULATORY_CARE_PROVIDER_SITE_OTHER): Payer: Managed Care, Other (non HMO) | Admitting: Family Medicine

## 2021-10-03 ENCOUNTER — Encounter: Payer: Self-pay | Admitting: Family Medicine

## 2021-10-03 VITALS — BP 133/79 | HR 61 | Temp 98.0°F | Ht 69.75 in | Wt 208.8 lb

## 2021-10-03 DIAGNOSIS — Z23 Encounter for immunization: Secondary | ICD-10-CM

## 2021-10-03 DIAGNOSIS — I2510 Atherosclerotic heart disease of native coronary artery without angina pectoris: Secondary | ICD-10-CM

## 2021-10-03 DIAGNOSIS — K760 Fatty (change of) liver, not elsewhere classified: Secondary | ICD-10-CM

## 2021-10-03 DIAGNOSIS — E785 Hyperlipidemia, unspecified: Secondary | ICD-10-CM | POA: Diagnosis not present

## 2021-10-03 DIAGNOSIS — Z125 Encounter for screening for malignant neoplasm of prostate: Secondary | ICD-10-CM | POA: Diagnosis not present

## 2021-10-03 DIAGNOSIS — Z0000 Encounter for general adult medical examination without abnormal findings: Secondary | ICD-10-CM

## 2021-10-03 DIAGNOSIS — R7303 Prediabetes: Secondary | ICD-10-CM | POA: Diagnosis not present

## 2021-10-03 DIAGNOSIS — I2584 Coronary atherosclerosis due to calcified coronary lesion: Secondary | ICD-10-CM

## 2021-10-03 DIAGNOSIS — I700 Atherosclerosis of aorta: Secondary | ICD-10-CM

## 2021-10-03 LAB — LIPID PANEL
Cholesterol: 215 mg/dL — ABNORMAL HIGH (ref 0–200)
HDL: 46.9 mg/dL (ref >39.00)
Total CHOL/HDL Ratio: 5
Triglycerides: 501 mg/dL — ABNORMAL HIGH (ref 0.0–149.0)

## 2021-10-03 LAB — HEMOGLOBIN A1C: Hgb A1c MFr Bld: 6 % (ref 4.6–6.5)

## 2021-10-03 LAB — LDL CHOLESTEROL, DIRECT: Direct LDL: 121 mg/dL

## 2021-10-03 LAB — PSA: PSA: 0.61 ng/mL (ref 0.10–4.00)

## 2021-10-03 NOTE — Progress Notes (Signed)
Office Note 10/03/2021  CC:  Chief Complaint  Patient presents with   Annual Exam    Pt is fasting   HPI:  Patient is a 61 y.o. male who is here for annual health maintenance exam and f/u prediabetes and HLD.  Xeloda for appendiceal/cecal cancer--- has finished this.  CT abdomen 2 days ago for surveillance showed no sign of cancer recurrence.  He tells me that his oncologist has informed him that he has a great chance at long-term remission. The CAT scan turned up some fatty liver as well as aortic atherosclerosis and scattered left and right coronary calcifications.  He feels well, exercises regularly with weights and takes long walks most days with his dog. Diet sounds pretty good for the most part.  Past Medical History:  Diagnosis Date   Arthritis    lower back   Asymmetrical sensorineural hearing loss    L>>R: ENT eval 04/2018.  Pt declined MRI brain at that time.   Cancer of appendix ([facility])    Colon adenocarcinoma ([facility]) 1995   goblet cell adenocarcinoma of colon, dx'd when he got acute appendicitis w/perf-->path showed adenocarcinoma with positive margins.  CT chest neg for metastatic dz, CEA neg. R hemicolectomy with iliocolonic anastomosis 11/2020.   History of adenomatous polyp of colon 07/2016   History of vitamin D deficiency    still present as of 09/2017.  High dose vit D started 10/16/17.  Increased dose to 50K twice per week 02/18/18.   Hyperlipidemia    Mild, never meds   Metastatic malignant melanoma ([facility]) 1989   Recurrence L upper arm 1993.  Then left back and LUL lung recurrence 1995-resected.  Brain lesion recurrence 1996-resected.  RLL lung lesion 1996--chemo & then resection.  Small bowel recurrence resected 1997, then mesenteric lesion resected 1998.  No sign of recurrence since 1998.  Released by onc/Dr. Magrinot as of 12/2015 (needs annual dermatologist exam and annual ophth exam).   Polyposis of colon    Prediabetes    A1c 5.9% 12/2014.  6.2% 09/2017. 6.3%  04/2019.    Past Surgical History:  Procedure Laterality Date   APPENDECTOMY     BRAIN SURGERY     Removal of melanoma met   COLON SURGERY  11/2020   Proximal hemicolectomy and lysis of adhesions and repair of incarcerated incisional hernia.  For colon ca resection.  iliocolonic anastomosis.   COLONOSCOPY  2009; 07/2016   11/27/20->adenoma   COLONOSCOPY WITH PROPOFOL N/A 08/18/2016   Tubular adenoma.  Recall 3 yrs.  Procedure: COLONOSCOPY WITH PROPOFOL;  Surgeon: Charolett Bumpers, MD;  [facility];  Service: Endoscopy;  Laterality: N/A;   IR SINUS/FIST TUBE CHK-NON GI  10/08/2020   LAPAROSCOPIC APPENDECTOMY N/A 09/28/2020   Procedure: APPENDECTOMY LAPAROSCOPIC;  Surgeon: Harriette Bouillon, MD;  Location: [facility];  Service: General;  Laterality: N/A;   LUNG SURGERY     "              "          "        "   SMALL INTESTINE SURGERY  1998   x2 from melanoma    Family History  Problem Relation Age of Onset   Breast cancer Mother 27       metastatic   Alcohol abuse Father    Thyroid cancer Sister 33   Colon cancer Neg Hx    Esophageal cancer Neg Hx    Stomach cancer Neg Hx  Rectal cancer Neg Hx     Social History   Socioeconomic History   Marital status: Divorced    Spouse name: Not on file   Number of children: 2   Years of education: Not on file   Highest education level: Not on file  Occupational History    Employer: [employer]    Comment: Driver  Tobacco Use   Smoking status: Never   Smokeless tobacco: Former    Quit date: 2021  Vaping Use   Vaping Use: Never used  Substance and Sexual Activity   Alcohol use: Not Currently    Comment: 1 drink/ 2 weeks   Drug use: No   Sexual activity: Yes  Other Topics Concern   Not on file  Social History Narrative   Married, 2 sons.   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 Drives a lot.   Orig from [place].   No T/A/Ds.   Diet ok except fast foods.   Social Determinants of Health   Financial Resource Strain: Not on file  Food  Insecurity: Not on file  Transportation Needs: Not on file  Physical Activity: Not on file  Stress: Not on file  Social Connections: Not on file  Intimate Partner Violence: Not on file    Outpatient Medications Prior to Visit  Medication Sig Dispense Refill   Cholecalciferol (VITAMIN D3) 50 MCG (2000 UT) TABS Take 2,000 Units by mouth in the morning.     capecitabine (XELODA) 500 MG tablet Take 4 tablets by mouth in the morning and 3 tablets by mouth in the evening with food for 14 days on, then 7 day rest and repeat. Start date 06/03/2021 (Patient not taking: Reported on 07/09/2021) 98 tablet 0   No facility-administered medications prior to visit.    Allergies  Allergen Reactions   Aspirin Nausea And Vomiting   Penicillins Other (See Comments)    Childhood  Has patient had a PCN reaction causing immediate rash, facial/tongue/throat swelling, SOB or lightheadedness with hypotension: unknown Has patient had a PCN reaction causing severe rash involving mucus membranes or skin necrosis: {unkjnown Has patient had a PCN reaction that required hospitalization {no Has patient had a PCN reaction occurring within the last 10 years: no If all of the above answers are "NO", then may proceed with Cephalosporin use.    ROS Review of Systems  Constitutional:  Negative for appetite change, chills, fatigue and fever.  HENT:  Negative for congestion, dental problem, ear pain and sore throat.   Eyes:  Negative for discharge, redness and visual disturbance.  Respiratory:  Negative for cough, chest tightness, shortness of breath and wheezing.   Cardiovascular:  Negative for chest pain, palpitations and leg swelling.  Gastrointestinal:  Negative for abdominal pain, blood in stool, diarrhea, nausea and vomiting.  Genitourinary:  Negative for difficulty urinating, dysuria, flank pain, frequency, hematuria and urgency.  Musculoskeletal:  Negative for arthralgias, back pain, joint swelling, myalgias  and neck stiffness.  Skin:  Negative for pallor and rash.  Neurological:  Negative for dizziness, speech difficulty, weakness and headaches.  Hematological:  Negative for adenopathy. Does not bruise/bleed easily.  Psychiatric/Behavioral:  Negative for confusion and sleep disturbance. The patient is not nervous/anxious.    PE; Vitals with BMI 10/03/2021 07/09/2021 06/20/2021  Height 5' 9.75" - 5\' 8"   Weight 208 lbs 13 oz 195 lbs 202 lbs  BMI 30.16 29.66 30.72  Systolic 133 - 135  Diastolic 79 - 83  Pulse 61 - 60  Gen: Alert, well appearing.  Patient is oriented to person, place, time, and situation. AFFECT: pleasant, lucid thought and speech. ENT: Ears: EACs clear, normal epithelium.  TMs with good light reflex and landmarks bilaterally.  Eyes: no injection, icteris, swelling, or exudate.  EOMI, PERRLA. Nose: no drainage or turbinate edema/swelling.  No injection or focal lesion.  Mouth: lips without lesion/swelling.  Oral mucosa pink and moist.  Dentition intact and without obvious caries or gingival swelling.  Oropharynx without erythema, exudate, or swelling.  Neck: supple/nontender.  No LAD, mass, or TM.  Carotid pulses 2+ bilaterally, without bruits. CV: RRR, no m/r/g.   LUNGS: CTA bilat, nonlabored resps, good aeration in all lung fields. ABD: soft, NT, ND, BS normal.  No hepatospenomegaly or mass.  No bruits. EXT: no clubbing, cyanosis, or edema.  Musculoskeletal: no joint swelling, erythema, warmth, or tenderness.  ROM of all joints intact. Skin - no sores or suspicious lesions or rashes or color changes  Pertinent labs:  Lab Results  Component Value Date   TSH 3.09 07/16/2020   Lab Results  Component Value Date   WBC 5.4 10/01/2021   HGB 15.2 10/01/2021   HCT 44.8 10/01/2021   MCV 89.6 10/01/2021   PLT 214 10/01/2021   Lab Results  Component Value Date   CREATININE 0.87 10/01/2021   BUN 19 10/01/2021   NA 137 10/01/2021   K 4.4 10/01/2021   CL 102 10/01/2021   CO2  28 10/01/2021   Lab Results  Component Value Date   ALT 29 10/01/2021   AST 18 10/01/2021   ALKPHOS 43 10/01/2021   BILITOT 0.5 10/01/2021   Lab Results  Component Value Date   CHOL 223 (H) 07/16/2020   Lab Results  Component Value Date   HDL 42.10 07/16/2020   Lab Results  Component Value Date   LDLCALC 141 (H) 05/05/2019   Lab Results  Component Value Date   TRIG 150 (H) 10/08/2020   Lab Results  Component Value Date   CHOLHDL 5 07/16/2020   Lab Results  Component Value Date   PSA 0.80 07/16/2020   PSA 0.68 05/05/2019   PSA 0.74 10/15/2017   Lab Results  Component Value Date   HGBA1C 5.9 (H) 11/19/2020   ASSESSMENT AND PLAN:   #1 prediabetes. Hemoglobin A1c and fasting sugar today.  2.  Hyperlipidemia.  He has declined medications in the past and we discussed possible reconsideration of this medication in light of the coronary calcifications and aortic atherosclerosis noted on his recent CT. He still said he does not want to do any medications for this. Encouraged low-fat diet and weight loss to try to help his hepatic steatosis.  3) Health maintenance exam: Reviewed age and gender appropriate health maintenance issues (prudent diet, regular exercise, health risks of tobacco and excessive alcohol, use of seatbelts, fire alarms in home, use of sunscreen).  Also reviewed age and gender appropriate health screening as well as vaccine recommendations. Vaccines: Tdap-->given today.  Shingrix--pt declined. Labs: cmet and cbc normal 2 days ago.  Will do lipids and Hba1c today. Prostate ca screening: PSA today Colon ca screening: hx adenomatous polyps, most recent colonoscopy 11/27/20, further timing of colonoscopies per GI.  An After Visit Summary was printed and given to the patient.  FOLLOW UP:  Return in about 6 months (around 04/05/2022) for routine chronic illness f/u.  Signed:  Santiago Bumpers, MD           10/03/2021

## 2021-10-03 NOTE — Patient Instructions (Signed)

## 2021-10-04 ENCOUNTER — Telehealth: Payer: Self-pay

## 2021-10-04 NOTE — Telephone Encounter (Signed)
-----   Message from Ladell Pier, MD sent at 10/03/2021  4:28 PM EDT ----- ?Please call patient, the CT shows no evidence of recurrent cancer, follow-up as scheduled ? ?

## 2021-10-04 NOTE — Telephone Encounter (Signed)
Patient gave verbal understanding and denied further questions ?

## 2021-10-15 ENCOUNTER — Inpatient Hospital Stay (HOSPITAL_BASED_OUTPATIENT_CLINIC_OR_DEPARTMENT_OTHER): Payer: Managed Care, Other (non HMO) | Admitting: Oncology

## 2021-10-15 ENCOUNTER — Other Ambulatory Visit: Payer: Self-pay

## 2021-10-15 VITALS — BP 137/83 | HR 62 | Temp 97.8°F | Resp 18 | Ht 69.0 in | Wt 206.0 lb

## 2021-10-15 DIAGNOSIS — C181 Malignant neoplasm of appendix: Secondary | ICD-10-CM | POA: Diagnosis present

## 2021-10-15 NOTE — Progress Notes (Deleted)
?  [place] ?OFFICE PROGRESS NOTE ? ? ?Diagnosis:  ? ?INTERVAL HISTORY:  ? ?*** ? ?Objective: ? ?Vital signs in last 24 hours: ? ?Blood pressure 137/83, pulse 62, temperature 97.8 ?F (36.6 ?C), temperature source Oral, resp. rate 18, height '5\' 9"'$  (1.753 m), weight 206 lb (93.4 kg), SpO2 98 %. ?  ? ?HEENT: *** ?Lymphatics: *** ?Resp: *** ?Cardio: *** ?GI: *** ?Vascular: *** ?Neuro:***  ?Skin:***  ? ?Portacath/PICC-without erythema ? ?Lab Results: ? ?Lab Results  ?Component Value Date  ? WBC 5.4 10/01/2021  ? HGB 15.2 10/01/2021  ? HCT 44.8 10/01/2021  ? MCV 89.6 10/01/2021  ? PLT 214 10/01/2021  ? NEUTROABS 3.2 10/01/2021  ? ? ?CMP  ?Lab Results  ?Component Value Date  ? NA 137 10/01/2021  ? K 4.4 10/01/2021  ? CL 102 10/01/2021  ? CO2 28 10/01/2021  ? GLUCOSE 113 (H) 10/01/2021  ? BUN 19 10/01/2021  ? CREATININE 0.87 10/01/2021  ? CALCIUM 9.4 10/01/2021  ? PROT 7.4 10/01/2021  ? ALBUMIN 4.5 10/01/2021  ? AST 18 10/01/2021  ? ALT 29 10/01/2021  ? ALKPHOS 43 10/01/2021  ? BILITOT 0.5 10/01/2021  ? GFRNONAA >60 10/01/2021  ? ? ?Lab Results  ?Component Value Date  ? CEA1 1.6 10/04/2020  ? CEA 2.13 10/01/2021  ? ? ?Lab Results  ?Component Value Date  ? INR 1.0 10/03/2020  ? LABPROT 13.2 10/03/2020  ? ? ?Imaging: ? ?No results found. ? ?Medications: I have reviewed the patient's current medications. ? ? 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  Grade 1.  Proximal resection margin positive.  Focal disruption in the mid segment noted ?CT abdomen/pelvis 09/15/3333- acute uncomplicated appendicitis ?CT chest 10/03/2020- no suspicious pulmonary nodules, no evidence of metastatic disease ?Colonoscopy 11/27/2020-normal appendiceal orifice, multiple polyps removed from the colon ?Right colectomy 11/28/2020- no evidence of metastatic disease,  no residual goblet cell carcinoma, 23 negative lymph nodes ?Cycle 1 Xeloda 01/28/2021 ?Cycle 2 Xeloda 02/18/2021 ?Cycle 3 Xeloda 03/11/2021 ?Cycle 4 Xeloda 04/01/2021 ?Cycle 5 Xeloda 04/22/2021 ?Cycle 6 Xeloda 05/13/2021 ?Cycle 7 Xeloda 06/03/2021 ?Cycle 8 Xeloda 06/24/2021 ?Post appendectomy fluid collection- drained by interventional radiology 10/03/2020, negative cytology ?Colonoscopy 11/27/2020, sessile serrated polyps, tubular adenomas, and hyperplastic polyps ?Melanoma, left arm November 1989, Clark's level 3, Breslow depth 1.0 ?Recurrent left upper arm February 1993-excised ?Recurrence left back, left lower lung (removed in 1995), solitary brain lesion resected in September 1996, status post treatment with the "[place] "regimen, measurable disease in the right lung-chemotherapy ineffective, right lung lesion removed November 1996 ?Resection of small bowel lesion in February 1997 and a mesenteric lesion in 1998 ? ? ? ? ?Disposition: ?*** ? ?Betsy Coder, MD ? ?10/15/2021  ?10:07 AM ? ? ?

## 2021-10-15 NOTE — Progress Notes (Signed)
?  [place] ?OFFICE PROGRESS NOTE ? ? ?Diagnosis: Appendix carcinoma ? ?INTERVAL HISTORY:  ? ?Sean [name] returns as scheduled.  He feels well.  He is working.  No complaint. ? ?Objective: ? ?Vital signs in last 24 hours: ? ?Blood pressure 137/83, pulse 62, temperature 97.8 ?F (36.6 ?C), temperature source Oral, resp. rate 18, height '5\' 9"'$  (1.753 m), weight 206 lb (93.4 kg), SpO2 98 %. ?  ? ?HEENT: Neck without mass ?Lymphatics: No cervical, supraclavicular, axillary, or inguinal nodes ?Resp: Lungs clear bilaterally ?Cardio: Regular rate and rhythm ?GI: No hepatosplenomegaly ?Vascular: No leg edema ?  ? ? ?Lab Results: ? ?Lab Results  ?Component Value Date  ? WBC 5.4 10/01/2021  ? HGB 15.2 10/01/2021  ? HCT 44.8 10/01/2021  ? MCV 89.6 10/01/2021  ? PLT 214 10/01/2021  ? NEUTROABS 3.2 10/01/2021  ? ? ?CMP  ?Lab Results  ?Component Value Date  ? NA 137 10/01/2021  ? K 4.4 10/01/2021  ? CL 102 10/01/2021  ? CO2 28 10/01/2021  ? GLUCOSE 113 (H) 10/01/2021  ? BUN 19 10/01/2021  ? CREATININE 0.87 10/01/2021  ? CALCIUM 9.4 10/01/2021  ? PROT 7.4 10/01/2021  ? ALBUMIN 4.5 10/01/2021  ? AST 18 10/01/2021  ? ALT 29 10/01/2021  ? ALKPHOS 43 10/01/2021  ? BILITOT 0.5 10/01/2021  ? GFRNONAA >60 10/01/2021  ? ? ?Lab Results  ?Component Value Date  ? CEA1 1.6 10/04/2020  ? CEA 2.13 10/01/2021  ? ? ? ? ?Medications: I have reviewed the patient's current medications. ? ? 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  Grade 1.  Proximal resection margin positive.  Focal disruption in the mid segment noted ?CT abdomen/pelvis 3/00/7622- acute uncomplicated appendicitis ?CT chest 10/03/2020- no suspicious pulmonary nodules, no evidence of metastatic disease ?Colonoscopy 11/27/2020-normal appendiceal orifice, multiple polyps removed from the  colon ?Right colectomy 11/28/2020- no evidence of metastatic disease, no residual goblet cell carcinoma, 23 negative lymph nodes ?Cycle 1 Xeloda 01/28/2021 ?Cycle 2 Xeloda 02/18/2021 ?Cycle 3 Xeloda 03/11/2021 ?Cycle 4 Xeloda 04/01/2021 ?Cycle 5 Xeloda 04/22/2021 ?Cycle 6 Xeloda 05/13/2021 ?Cycle 7 Xeloda 06/03/2021 ?Cycle 8 Xeloda 06/24/2021 ?CTs 10/01/2021-no evidence of recurrent disease ?Post appendectomy fluid collection- drained by interventional radiology 10/03/2020, negative cytology ?Colonoscopy 11/27/2020, sessile serrated polyps, tubular adenomas, and hyperplastic polyps ?Melanoma, left arm November 1989, Clark's level 3, Breslow depth 1.0 ?Recurrent left upper arm February 1993-excised ?Recurrence left back, left lower lung (removed in 1995), solitary brain lesion resected in September 1996, status post treatment with the "[place] "regimen, measurable disease in the right lung-chemotherapy ineffective, right lung lesion removed November 1996 ?Resection of small bowel lesion in February 1997 and a mesenteric lesion in 1998 ? ? ? ? ?Disposition: ?Sean [name] remains in clinical remission the appendix cancer and melanoma.  He will return for an office visit and CEA in 6 months.  He will be scheduled for surveillance CTs in 1 year. ? ?He will follow-up with Dr.McGowen for management of coronary calcifications and high cholesterol/triglycerides. ? ?Betsy Coder, MD ? ?10/15/2021  ?10:19 AM ? ? ?

## 2021-10-25 ENCOUNTER — Other Ambulatory Visit (HOSPITAL_BASED_OUTPATIENT_CLINIC_OR_DEPARTMENT_OTHER): Payer: 59

## 2021-10-25 ENCOUNTER — Other Ambulatory Visit: Payer: 59

## 2021-10-28 ENCOUNTER — Ambulatory Visit: Payer: 59 | Admitting: Oncology

## 2022-04-07 ENCOUNTER — Ambulatory Visit: Payer: Managed Care, Other (non HMO) | Admitting: Family Medicine

## 2022-04-07 NOTE — Progress Notes (Deleted)
OFFICE VISIT  04/07/2022  CC: No chief complaint on file.  Patient is a 61 y.o. male who presents for 60-monthfollow-up hyperlipidemia and prediabetes. A/P as of last visit: "#1 prediabetes. Hemoglobin A1c and fasting sugar today.  2.  Hyperlipidemia.  He has declined medications in the past and we discussed possible reconsideration of this medication in light of the coronary calcifications and aortic atherosclerosis noted on his recent CT. He still said he does not want to do any medications for this. Encouraged low-fat diet and weight loss to try to help his hepatic steatosis.  3) Health maintenance exam: Reviewed age and gender appropriate health maintenance issues (prudent diet, regular exercise, health risks of tobacco and excessive alcohol, use of seatbelts, fire alarms in home, use of sunscreen).  Also reviewed age and gender appropriate health screening as well as vaccine recommendations. Vaccines: Tdap-->given today.  Shingrix--pt declined. Labs: cmet and cbc normal 2 days ago.  Will do lipids and Hba1c today. Prostate ca screening: PSA today Colon ca screening: hx adenomatous polyps, most recent colonoscopy 11/27/20, further timing of colonoscopies per GI."  INTERIM HX: ***   Past Medical History:  Diagnosis Date   Arthritis    lower back   Asymmetrical sensorineural hearing loss    L>>R: ENT eval 04/2018.  Pt declined MRI brain at that time.   Cancer of appendix ([facility]    Colon adenocarcinoma ([facility] 1995   goblet cell adenocarcinoma of colon, dx'd when he got acute appendicitis w/perf-->path showed adenocarcinoma with positive margins.  CT chest neg for metastatic dz, CEA neg. R hemicolectomy with iliocolonic anastomosis 11/2020.   Coronary artery calcification    and aortic atherosclerosis.  Noted on 09/2021 CT that was done for cancer surveillance   Hepatic steatosis    noted on CT 09/2021 for cancer surveillance   History of adenomatous polyp of colon 07/2016   History  of vitamin D deficiency    still present as of 09/2017.  High dose vit D started 10/16/17.  Increased dose to 50K twice per week 02/18/18.   Hyperlipidemia    Mild, never meds   Metastatic malignant melanoma ([facility] 1989   Recurrence L upper arm 1993.  Then left back and LUL lung recurrence 1995-resected.  Brain lesion recurrence 1996-resected.  RLL lung lesion 1996--chemo & then resection.  Small bowel recurrence resected 1997, then mesenteric lesion resected 1998.  No sign of recurrence since 1998.  Released by onc/Dr. Magrinot as of 12/2015 (needs annual dermatologist exam and annual ophth exam).   Polyposis of colon    Prediabetes    A1c 5.9% 12/2014.  6.2% 09/2017. 6.3% 04/2019.    Past Surgical History:  Procedure Laterality Date   APPENDECTOMY     BRAIN SURGERY     Removal of melanoma met   COLON SURGERY  11/2020   Proximal hemicolectomy and lysis of adhesions and repair of incarcerated incisional hernia.  For colon ca resection.  iliocolonic anastomosis.   COLONOSCOPY  2009; 07/2016   11/27/20->adenoma   COLONOSCOPY WITH PROPOFOL N/A 08/18/2016   Tubular adenoma.  Recall 3 yrs.  Procedure: COLONOSCOPY WITH PROPOFOL;  Surgeon: MGarlan Fair MD;  Location: [facility];  Service: Endoscopy;  Laterality: N/A;   IR SINUS/FIST TUBE CHK-NON GI  10/08/2020   LAPAROSCOPIC APPENDECTOMY N/A 09/28/2020   Procedure: APPENDECTOMY LAPAROSCOPIC;  Surgeon: CErroll Luna MD;  Location: [facility];  Service: General;  Laterality: N/A;   LUNG SURGERY     "              "          "        "  SMALL INTESTINE SURGERY  1998   x2 from melanoma    Outpatient Medications Prior to Visit  Medication Sig Dispense Refill   Cholecalciferol (VITAMIN D3) 50 MCG (2000 UT) TABS Take 2,000 Units by mouth in the morning.     Omega-3 Fatty Acids (FISH OIL) 1000 MG CAPS Take by mouth.     No facility-administered medications prior to visit.    Allergies  Allergen Reactions   Aspirin Nausea And Vomiting    Penicillins Other (See Comments)    Childhood  Has patient had a PCN reaction causing immediate rash, facial/tongue/throat swelling, SOB or lightheadedness with hypotension: unknown Has patient had a PCN reaction causing severe rash involving mucus membranes or skin necrosis: {unkjnown Has patient had a PCN reaction that required hospitalization {no Has patient had a PCN reaction occurring within the last 10 years: no If all of the above answers are "NO", then may proceed with Cephalosporin use.    ROS As per HPI  PE:    10/15/2021    9:40 AM 10/03/2021    8:19 AM 07/09/2021   10:30 AM  Vitals with BMI  Height 5' 9"  5' 9.75"   Weight 206 lbs 208 lbs 13 oz 195 lbs  BMI 30.41 56.70 14.10  Systolic 301 314   Diastolic 83 79   Pulse 62 61    Physical Exam  ***  LABS:  Last CBC Lab Results  Component Value Date   WBC 5.4 10/01/2021   HGB 15.2 10/01/2021   HCT 44.8 10/01/2021   MCV 89.6 10/01/2021   MCH 30.4 10/01/2021   RDW 11.9 10/01/2021   PLT 214 38/88/7579   Last metabolic panel Lab Results  Component Value Date   GLUCOSE 113 (H) 10/01/2021   NA 137 10/01/2021   K 4.4 10/01/2021   CL 102 10/01/2021   CO2 28 10/01/2021   BUN 19 10/01/2021   CREATININE 0.87 10/01/2021   GFRNONAA >60 10/01/2021   CALCIUM 9.4 10/01/2021   PHOS 4.2 12/01/2020   PROT 7.4 10/01/2021   ALBUMIN 4.5 10/01/2021   BILITOT 0.5 10/01/2021   ALKPHOS 43 10/01/2021   AST 18 10/01/2021   ALT 29 10/01/2021   ANIONGAP 7 10/01/2021   Last lipids Lab Results  Component Value Date   CHOL 215 (H) 10/03/2021   HDL 46.90 10/03/2021   LDLCALC 141 (H) 05/05/2019   LDLDIRECT 121.0 10/03/2021   TRIG (H) 10/03/2021    501.0 Triglyceride is over 400; calculations on Lipids are invalid.   CHOLHDL 5 10/03/2021   Last hemoglobin A1c Lab Results  Component Value Date   HGBA1C 6.0 10/03/2021   Last thyroid functions Lab Results  Component Value Date   TSH 3.09 07/16/2020  Last vitamin  D Lab Results  Component Value Date   VD25OH 21.55 (L) 07/16/2020   IMPRESSION AND PLAN:  No problem-specific Assessment & Plan notes found for this encounter.   An After Visit Summary was printed and given to the patient.  FOLLOW UP: No follow-ups on file.  Signed:  Crissie Sickles, MD           04/07/2022

## 2022-04-10 ENCOUNTER — Ambulatory Visit (INDEPENDENT_AMBULATORY_CARE_PROVIDER_SITE_OTHER): Payer: Managed Care, Other (non HMO) | Admitting: Family Medicine

## 2022-04-10 ENCOUNTER — Encounter: Payer: Self-pay | Admitting: Family Medicine

## 2022-04-10 VITALS — BP 118/72 | HR 52 | Temp 98.3°F | Ht 69.0 in | Wt 203.2 lb

## 2022-04-10 DIAGNOSIS — R7303 Prediabetes: Secondary | ICD-10-CM | POA: Diagnosis not present

## 2022-04-10 DIAGNOSIS — E559 Vitamin D deficiency, unspecified: Secondary | ICD-10-CM | POA: Diagnosis not present

## 2022-04-10 DIAGNOSIS — E782 Mixed hyperlipidemia: Secondary | ICD-10-CM | POA: Diagnosis not present

## 2022-04-10 LAB — LIPID PANEL
Cholesterol: 202 mg/dL — ABNORMAL HIGH (ref 0–200)
HDL: 46.7 mg/dL (ref >39.00)
LDL Cholesterol: 131 mg/dL — ABNORMAL HIGH (ref 0–99)
NonHDL: 155.23
Total CHOL/HDL Ratio: 4
Triglycerides: 123 mg/dL (ref 0.0–149.0)
VLDL: 24.6 mg/dL (ref 0.0–40.0)

## 2022-04-10 LAB — COMPREHENSIVE METABOLIC PANEL
ALT: 27 U/L (ref 0–53)
AST: 19 U/L (ref 0–37)
Albumin: 3.9 g/dL (ref 3.5–5.2)
Alkaline Phosphatase: 45 U/L (ref 39–117)
BUN: 16 mg/dL (ref 6–23)
CO2: 28 mEq/L (ref 19–32)
Calcium: 8.7 mg/dL (ref 8.4–10.5)
Chloride: 102 mEq/L (ref 96–112)
Creatinine, Ser: 0.84 mg/dL (ref 0.40–1.50)
GFR: 94.72 mL/min (ref >60.00)
Glucose, Bld: 96 mg/dL (ref 70–99)
Potassium: 4.4 mEq/L (ref 3.5–5.1)
Sodium: 138 mEq/L (ref 135–145)
Total Bilirubin: 0.7 mg/dL (ref 0.2–1.2)
Total Protein: 7 g/dL (ref 6.0–8.3)

## 2022-04-10 LAB — POCT GLYCOSYLATED HEMOGLOBIN (HGB A1C)
HbA1c POC (<> result, manual entry): 5.8 % (ref 4.0–5.6)
HbA1c, POC (controlled diabetic range): 5.8 % (ref 0.0–7.0)
HbA1c, POC (prediabetic range): 5.8 % (ref 5.7–6.4)
Hemoglobin A1C: 5.8 % — AB (ref 4.0–5.6)

## 2022-04-10 NOTE — Progress Notes (Signed)
OFFICE VISIT  04/10/2022  CC:  Chief Complaint  Patient presents with   Hyperlipidemia    Pt is fasting   Prediabetes    Patient is a 61 y.o. male who presents for 10-monthfollow-up hyperlipidemia, prediabetes, and vitamin D deficiency. A/P as of last visit: "#1 prediabetes. Hemoglobin A1c and fasting sugar today.  2.  Hyperlipidemia.  He has declined medications in the past and we discussed possible reconsideration of this medication in light of the coronary calcifications and aortic atherosclerosis noted on his recent CT. He still said he does not want to do any medications for this. Encouraged low-fat diet and weight loss to try to help his hepatic steatosis.  3) Health maintenance exam: Reviewed age and gender appropriate health maintenance issues (prudent diet, regular exercise, health risks of tobacco and excessive alcohol, use of seatbelts, fire alarms in home, use of sunscreen).  Also reviewed age and gender appropriate health screening as well as vaccine recommendations. Vaccines: Tdap-->given today.  Shingrix--pt declined. Labs: cmet and cbc normal 2 days ago.  Will do lipids and Hba1c today. Prostate ca screening: PSA today Colon ca screening: hx adenomatous polyps, most recent colonoscopy 11/27/20, further timing of colonoscopies per GI"  INTERIM HX: Arnell feels well.  He is active at work as a tConservator, museum/galleryfor [employer]  He exercises regularly.  He is happy to say that he is in a new relationship after having been divorced a few years ago.  He says he has been eating healthier the last several months.  Past Medical History:  Diagnosis Date   Arthritis    lower back   Asymmetrical sensorineural hearing loss    L>>R: ENT eval 04/2018.  Pt declined MRI brain at that time.   [place]cer of [place] ([facility]    [place] ([facility] 1995   goblet cell adenocarcinoma of colon, dx'd when he got acute appendicitis w/perf-->path showed adenocarcinoma with positive  margins.  CT chest neg for metastatic dz, CEA neg. R hemicolectomy with iliocolonic anastomosis 11/2020.   Coronary artery calcification    and aortic atherosclerosis.  Noted on 09/2021 CT that was done for cancer surveillance   Hepatic steatosis    noted on CT 09/2021 for cancer surveillance   History of adenomatous polyp of colon 07/2016   History of vitamin D deficiency    still present as of 09/2017.  High dose vit D started 10/16/17.  Increased dose to 50K twice per week 02/18/18.   Hyperlipidemia    Mild, never meds   [place] malignant [place]   Recurrence L upper arm 1993.  Then left back and LUL lung recurrence 1995-resected.  Brain lesion recurrence 1996-resected.  RLL lung lesion 1996--chemo & then resection.  Small bowel recurrence resected 1997, then mesenteric lesion resected 1998.  No sign of recurrence since 1998.  Released by onc/Dr. Magrinot as of 12/2015 (needs annual dermatologist exam and annual ophth exam).   Polyposis of colon    Prediabetes    A1c 5.9% 12/2014.  6.2% 09/2017. 6.3% 04/2019.    Past Surgical History:  Procedure Laterality Date   APPENDECTOMY     BRAIN SURGERY     Removal of melanoma met   COLON SURGERY  11/2020   Proximal hemicolectomy and lysis of adhesions and repair of incarcerated incisional hernia.  For colon ca resection.  iliocolonic anastomosis.   COLONOSCOPY  2009; 07/2016   11/27/20->adenoma   COLONOSCOPY WITH PROPOFOL N/A 08/18/2016   Tubular adenoma.  Recall 3  yrs.  Procedure: COLONOSCOPY WITH PROPOFOL;  Surgeon: Garlan Fair, MD;  [facility];  Service: Endoscopy;  Laterality: N/A;   IR SINUS/FIST TUBE CHK-NON GI  10/08/2020   LAPAROSCOPIC APPENDECTOMY N/A 09/28/2020   Procedure: APPENDECTOMY LAPAROSCOPIC;  Surgeon: Erroll Luna, MD;  Location: [facility];  Service: General;  Laterality: N/A;   LUNG SURGERY     "              "          "        "   [place]   x2 from melanoma    Outpatient  Medications Prior to Visit  Medication Sig Dispense Refill   Cholecalciferol (VITAMIN D3) 50 MCG (2000 UT) TABS Take 2,000 Units by mouth in the morning.     Omega-3 Fatty Acids (FISH OIL) 1000 MG CAPS Take by mouth.     No facility-administered medications prior to visit.    Allergies  Allergen Reactions   Aspirin Nausea And Vomiting   Penicillins Other (See Comments)    Childhood  Has patient had a PCN reaction causing immediate rash, facial/tongue/throat swelling, SOB or lightheadedness with hypotension: unknown Has patient had a PCN reaction causing severe rash involving mucus membranes or skin necrosis: {unkjnown Has patient had a PCN reaction that required hospitalization {no Has patient had a PCN reaction occurring within the last 10 years: no If all of the above answers are "NO", then may proceed with Cephalosporin use.    ROS As per HPI  PE:    04/10/2022    8:34 AM 10/15/2021    9:40 AM 10/03/2021    8:19 AM  Vitals with BMI  Height 5' 9"  5' 9"  5' 9.75"  Weight 203 lbs 3 oz 206 lbs 208 lbs 13 oz  BMI 29.99 38.17 71.16  Systolic 579 038 333  Diastolic 72 83 79  Pulse 52 62 61     Physical Exam  Gen: Alert, well appearing.  Patient is oriented to person, place, time, and situation. AFFECT: pleasant, lucid thought and speech. CV: RRR, no m/r/g.   LUNGS: CTA bilat, nonlabored resps, good aeration in all lung fields.. EXT: no clubbing or cyanosis.  no edema.    LABS:  Last CBC Lab Results  Component Value Date   WBC 5.4 10/01/2021   HGB 15.2 10/01/2021   HCT 44.8 10/01/2021   MCV 89.6 10/01/2021   MCH 30.4 10/01/2021   RDW 11.9 10/01/2021   PLT 214 83/29/1916   Last metabolic panel Lab Results  Component Value Date   GLUCOSE 113 (H) 10/01/2021   NA 137 10/01/2021   K 4.4 10/01/2021   CL 102 10/01/2021   CO2 28 10/01/2021   BUN 19 10/01/2021   CREATININE 0.87 10/01/2021   GFRNONAA >60 10/01/2021   CALCIUM 9.4 10/01/2021   PHOS 4.2 12/01/2020    PROT 7.4 10/01/2021   ALBUMIN 4.5 10/01/2021   BILITOT 0.5 10/01/2021   ALKPHOS 43 10/01/2021   AST 18 10/01/2021   ALT 29 10/01/2021   ANIONGAP 7 10/01/2021   Last lipids Lab Results  Component Value Date   CHOL 215 (H) 10/03/2021   HDL 46.90 10/03/2021   LDLCALC 141 (H) 05/05/2019   LDLDIRECT 121.0 10/03/2021   TRIG (H) 10/03/2021    501.0 Triglyceride is over 400; calculations on Lipids are invalid.   CHOLHDL 5 10/03/2021   Last hemoglobin A1c Lab Results  Component Value Date  HGBA1C 5.8 (A) 04/10/2022   HGBA1C 5.8 04/10/2022   HGBA1C 5.8 04/10/2022   HGBA1C 5.8 04/10/2022   Last thyroid functions Lab Results  Component Value Date   TSH 3.09 07/16/2020   Last vitamin D Lab Results  Component Value Date   VD25OH 21.55 (L) 07/16/2020   IMPRESSION AND PLAN:  #1 prediabetes. Hemoglobin A1c 5.8% today.  Continue good dietary and exercise habits.  2.  Hyperlipidemia.  He has declined medications in the past and we discussed possible reconsideration of this medication in light of the coronary calcifications and aortic atherosclerosis noted on his recent CT. He still said he does not want to do any medications for this. Encouraged low-fat diet and weight loss to try to help his hepatic steatosis.  #3 vitamin D deficiency.  He continues on 2000 units vitamin D daily.  We will recheck vitamin D level today.  An After Visit Summary was printed and given to the patient.  FOLLOW UP: Return in about 6 months (around 10/09/2022) for annual CPE (fasting).  Signed:  Crissie Sickles, MD           04/10/2022

## 2022-04-11 ENCOUNTER — Encounter: Payer: Self-pay | Admitting: Family Medicine

## 2022-04-11 LAB — VITAMIN D 25 HYDROXY (VIT D DEFICIENCY, FRACTURES): VITD: 28.04 ng/mL — ABNORMAL LOW (ref 30.00–100.00)

## 2022-04-18 ENCOUNTER — Inpatient Hospital Stay (HOSPITAL_BASED_OUTPATIENT_CLINIC_OR_DEPARTMENT_OTHER): Payer: Managed Care, Other (non HMO) | Admitting: Oncology

## 2022-04-18 ENCOUNTER — Inpatient Hospital Stay: Payer: Managed Care, Other (non HMO) | Attending: Oncology

## 2022-04-18 VITALS — BP 140/82 | HR 60 | Temp 98.1°F | Resp 18 | Ht 69.0 in | Wt 205.0 lb

## 2022-04-18 DIAGNOSIS — C181 Malignant neoplasm of appendix: Secondary | ICD-10-CM

## 2022-04-18 DIAGNOSIS — Z9049 Acquired absence of other specified parts of digestive tract: Secondary | ICD-10-CM | POA: Insufficient documentation

## 2022-04-18 DIAGNOSIS — N50811 Right testicular pain: Secondary | ICD-10-CM | POA: Diagnosis not present

## 2022-04-18 DIAGNOSIS — Z8509 Personal history of malignant neoplasm of other digestive organs: Secondary | ICD-10-CM | POA: Insufficient documentation

## 2022-04-18 LAB — CEA (ACCESS): CEA (CHCC): 2.06 ng/mL (ref 0.00–5.00)

## 2022-04-18 NOTE — Progress Notes (Signed)
  [place] OFFICE PROGRESS NOTE   Diagnosis: Appendix carcinoma  INTERVAL HISTORY:   [name] returns as scheduled.  He feels well.  His appetite and energy level.  No abdominal pain.  No difficulty with bowel function.  No bleeding.  He continues yearly follow-up with dermatology. He reports intermittent discomfort in the right testicle for the past month.  No palpable change.  Objective:  Vital signs in last 24 hours:  Blood pressure (!) 140/82, pulse 60, temperature 98.1 F (36.7 C), temperature source Oral, resp. rate 18, height '5\' 9"'$  (1.753 m), weight 205 lb (93 kg), SpO2 98 %.    Lymphatics: No cervical, supraclavicular, axillary, or inguinal nodes Resp: Clear bilaterally Cardio: Regular rate and rhythm GI: No hepatosplenomegaly, no mass, nontender Vascular: No leg edema GU: Bilateral testis without mass.  No  Lab Results:  Lab Results  Component Value Date   WBC 5.4 10/01/2021   HGB 15.2 10/01/2021   HCT 44.8 10/01/2021   MCV 89.6 10/01/2021   PLT 214 10/01/2021   NEUTROABS 3.2 10/01/2021    CMP  Lab Results  Component Value Date   NA 138 04/10/2022   K 4.4 04/10/2022   CL 102 04/10/2022   CO2 28 04/10/2022   GLUCOSE 96 04/10/2022   BUN 16 04/10/2022   CREATININE 0.84 04/10/2022   CALCIUM 8.7 04/10/2022   PROT 7.0 04/10/2022   ALBUMIN 3.9 04/10/2022   AST 19 04/10/2022   ALT 27 04/10/2022   ALKPHOS 45 04/10/2022   BILITOT 0.7 04/10/2022   GFRNONAA >60 10/01/2021    Lab Results  Component Value Date   CEA1 1.6 10/04/2020   CEA 2.13 10/01/2021    Medications: I have reviewed the patient's current medications.   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  Grade 1.  Proximal resection margin positive.  Focal disruption in the mid segment noted CT  abdomen/pelvis 02/20/2335- acute uncomplicated appendicitis CT chest 10/03/2020- no suspicious pulmonary nodules, no evidence of metastatic disease Colonoscopy 11/27/2020-normal appendiceal orifice, multiple polyps removed from the colon Right colectomy 11/28/2020- no evidence of metastatic disease, no residual goblet cell carcinoma, 23 negative lymph nodes Cycle 1 Xeloda 01/28/2021 Cycle 2 Xeloda 02/18/2021 Cycle 3 Xeloda 03/11/2021 Cycle 4 Xeloda 04/01/2021 Cycle 5 Xeloda 04/22/2021 Cycle 6 Xeloda 05/13/2021 Cycle 7 Xeloda 06/03/2021 Cycle 8 Xeloda 06/24/2021 CTs 10/01/2021-no evidence of recurrent disease Post appendectomy fluid collection- drained by interventional radiology 10/03/2020, negative cytology Colonoscopy 11/27/2020, sessile serrated polyps, tubular adenomas, and hyperplastic polyps Melanoma, left arm November 1989, Clark's level 3, Breslow depth 1.0 Recurrent left upper arm February 1993-excised Recurrence left back, left lower lung (removed in 1995), solitary brain lesion resected in September 1996, status post treatment with the "[place] "regimen, measurable disease in the right lung-chemotherapy ineffective, right lung lesion removed November 1996 Resection of small bowel lesion in February 1997 and a mesenteric lesion in 1998      Disposition: Mr. Warzecha is in clinical remission from appendix carcinoma.  The CEA is normal.  He will return for an office visit and surveillance imaging in 6 months.  The right testicle discomfort is likely a benign finding.  He will call for consistent/increased pain or a palpable change.   Betsy Coder, MD  04/18/2022  10:05 AM

## 2022-09-05 ENCOUNTER — Telehealth: Payer: Self-pay | Admitting: *Deleted

## 2022-09-05 DIAGNOSIS — C181 Malignant neoplasm of appendix: Secondary | ICD-10-CM

## 2022-09-05 NOTE — Telephone Encounter (Signed)
Called to report he now has [facility] insurance and [place] location for radiology is no longer in network. He will send copy of new cards via MyChart to get put in system and we can work on PA for the following site that insurance says is OK: [facility] in [place] at [address] Phone [phone] Fax [phone] He was told that Dr. Benay Spice is still in network, but requests managed care confirm.

## 2022-09-09 ENCOUNTER — Encounter: Payer: Self-pay | Admitting: *Deleted

## 2022-09-18 ENCOUNTER — Encounter: Payer: Self-pay | Admitting: *Deleted

## 2022-09-18 NOTE — Progress Notes (Signed)
Faxed CT order, PA information and last CT report to [facility] at [place] [phone] for scan. Sean [name] notified to call facility next week to schedule and reminded him that his labs need to be done prior to scan. Notified [facility] to push over his 10/01/21 images for comparison.

## 2022-09-25 ENCOUNTER — Telehealth: Payer: Self-pay | Admitting: *Deleted

## 2022-09-25 NOTE — Telephone Encounter (Signed)
Called to inform nurse that his CT scan is scheduled for 09/30/22 at 0930 with [facility]. Wanted to confirm his contrast is to be consumed 2 hours and 1 hour prior to scan. Confirmed this with him and reminded him to be NPO 4 hours prior. He reports they will do the labs there so he does not need to come to [facility]. Called [facility] [phone]) to confirm his 10/01/21 images were pushed over to allow radiologist to do comparison. Was instructed to have radiology push them over to [facility] and then they will send them to [facility]ology center.  Requested images be sent w/Vanda.

## 2022-09-29 IMAGING — CT CT IMAGE GUIDED DRAINAGE BY PERCUTANEOUS CATHETER
1 of 2 series · 15 of 32 positions shown, 19 images · non-contrast
Comparison: none

INDICATION: 60-year-old male with a history appendectomy and right lower
quadrant fluid collection.
TECHNIQUE: Informed written consent was obtained from the patient after a
thorough discussion of the procedural risks, benefits and
alternatives. All questions were addressed. Maximal Sterile Barrier
Technique was utilized including caps, mask, sterile gowns, sterile
gloves, sterile drape, hand hygiene and skin antiseptic. A timeout
was performed prior to the initiation of the procedure.

[Series 2: i-spiral 5.0 bf37 · axial · 0.98mm/px · z∈[+1180,+1299]mm · 15 of 39 slices shown, 19 images]
[im 3/39  soft-tissue]
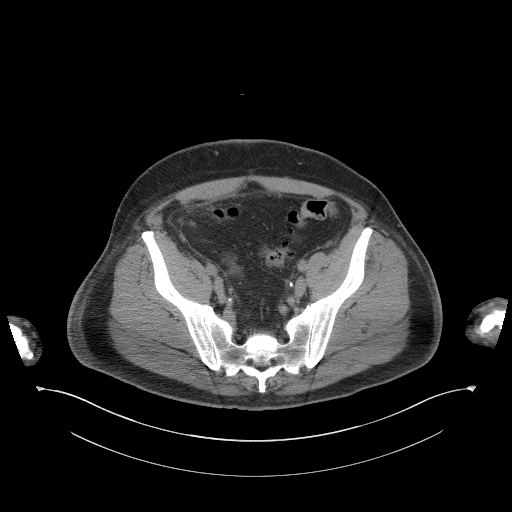
[im 3/39  bone]
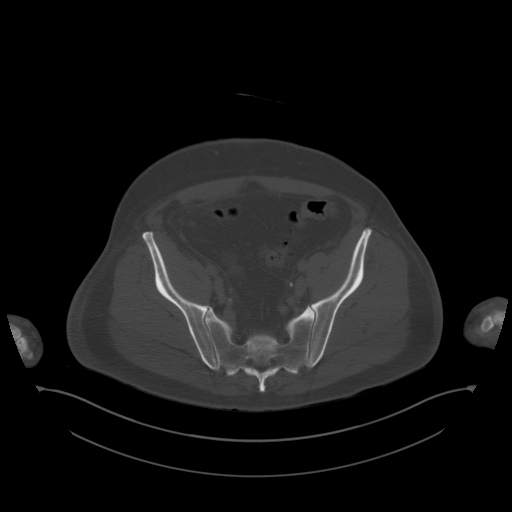
[im 6/39  soft-tissue]
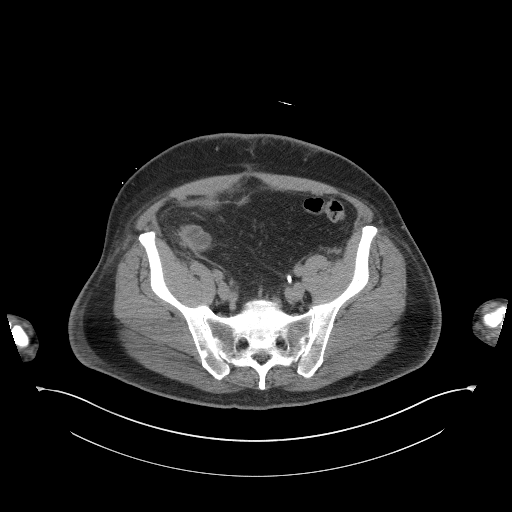
[im 9/39  soft-tissue]
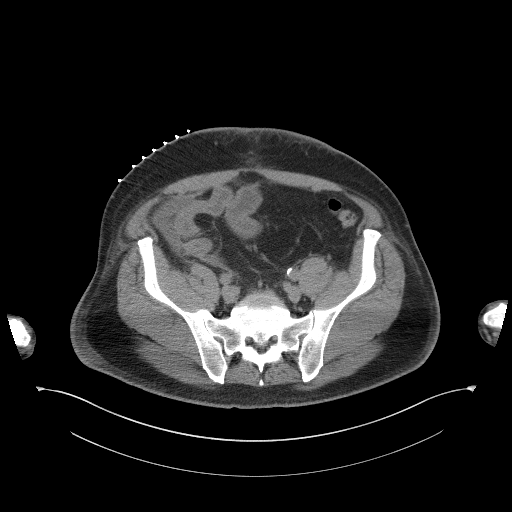
[im 12/39  soft-tissue]
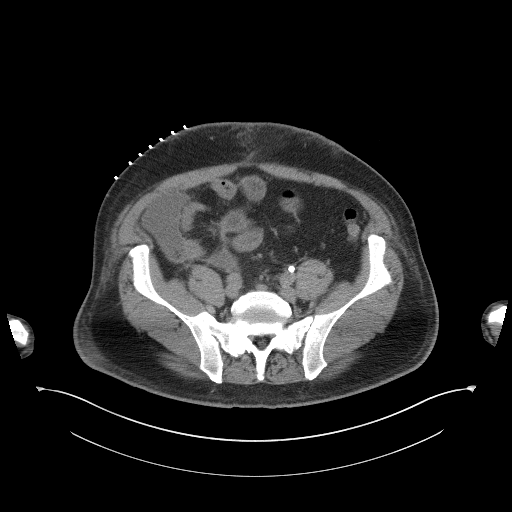
[im 15/39  soft-tissue]
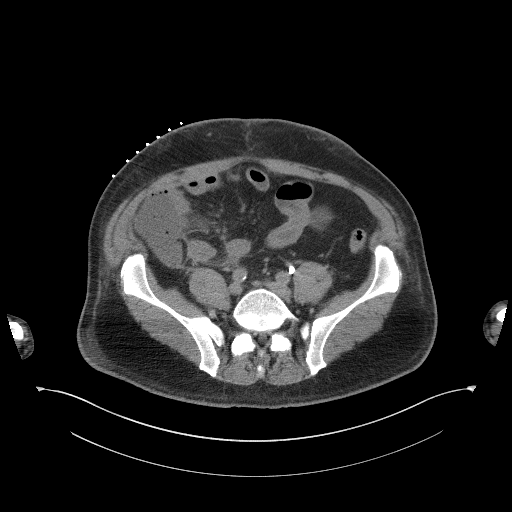
[im 17/39  soft-tissue]
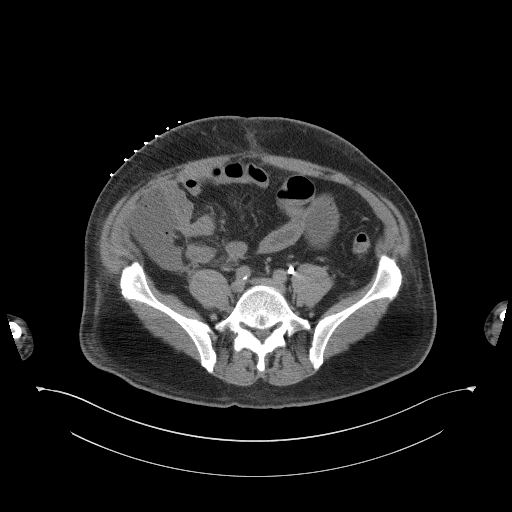
[im 20/39  soft-tissue]
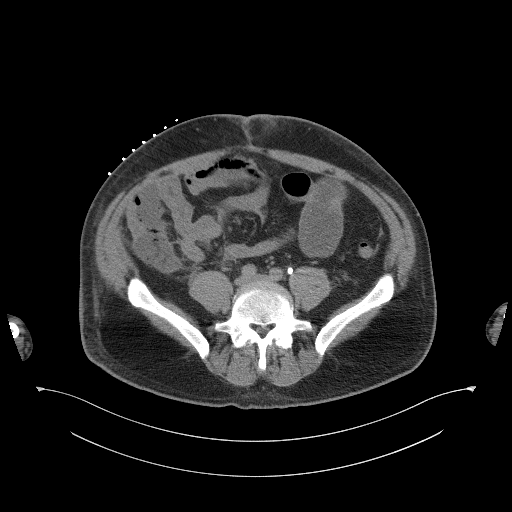
[im 23/39  soft-tissue]
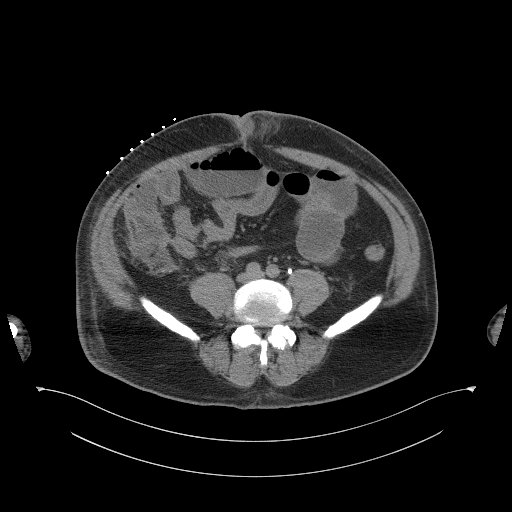
[im 26/39  soft-tissue]
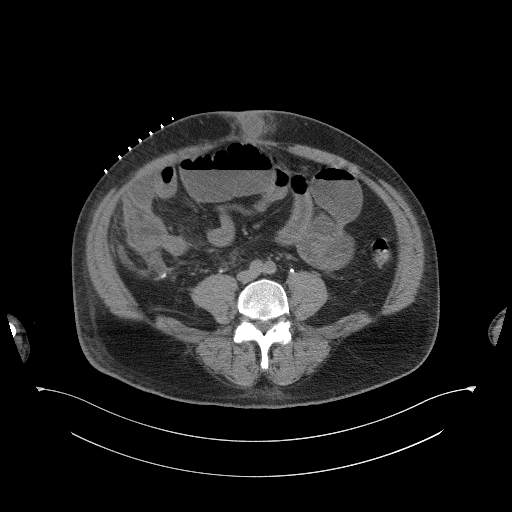
[im 26/39  bone]
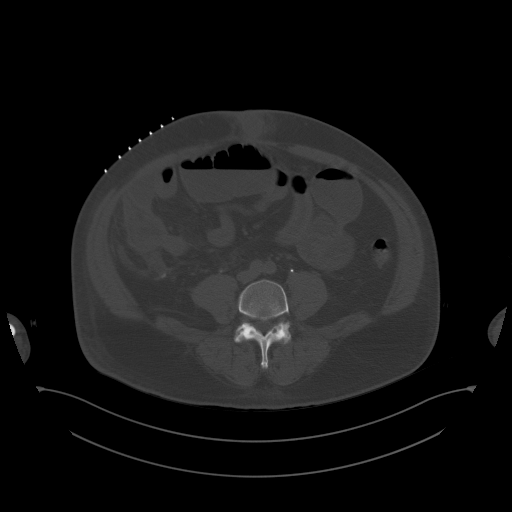
[im 29/39  soft-tissue]
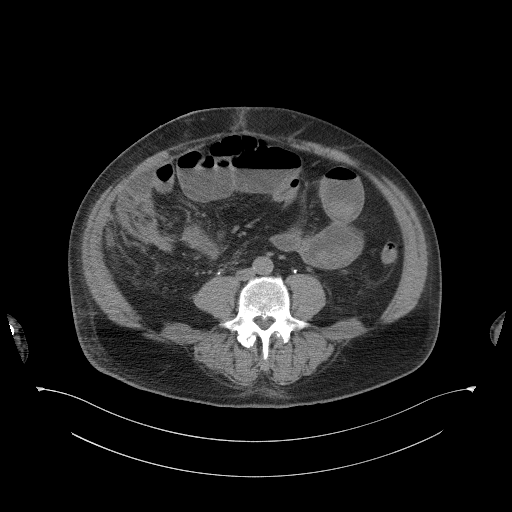
[im 31/39  soft-tissue]
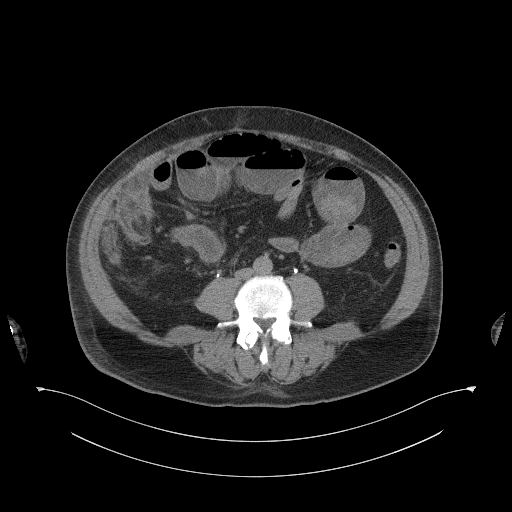
[im 33/39  lung]
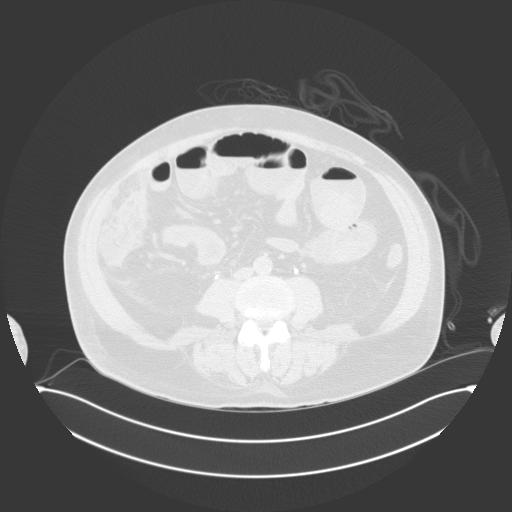
[im 34/39  soft-tissue]
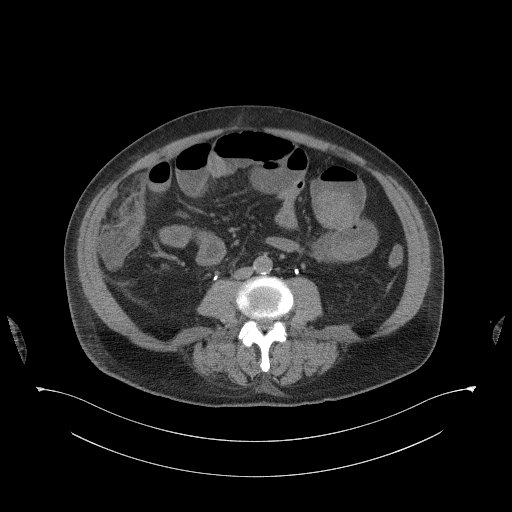
[im 34/39  lung]
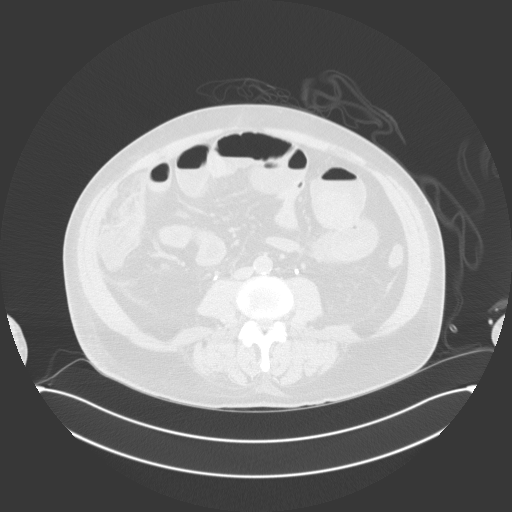
[im 36/39  lung]
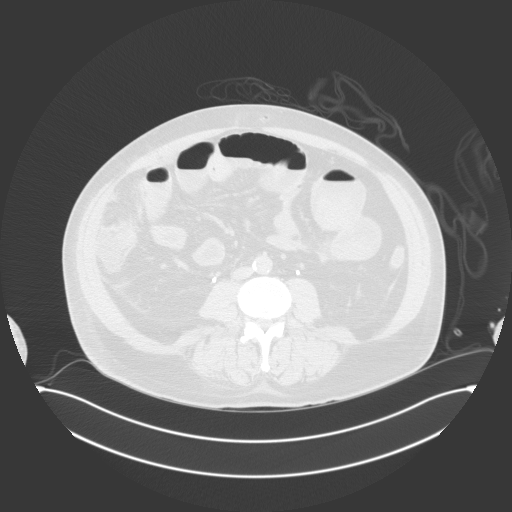
[im 37/39  soft-tissue]
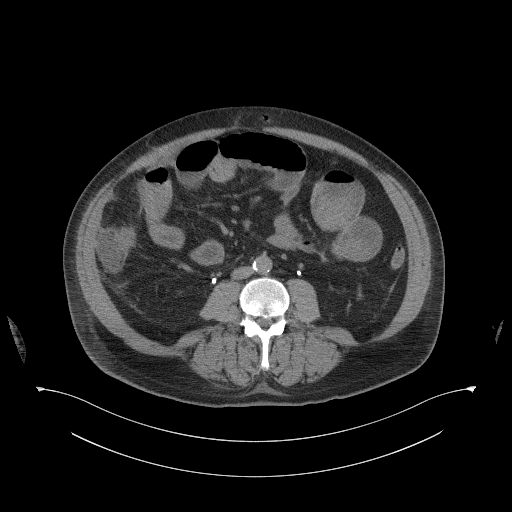
[im 37/39  lung]
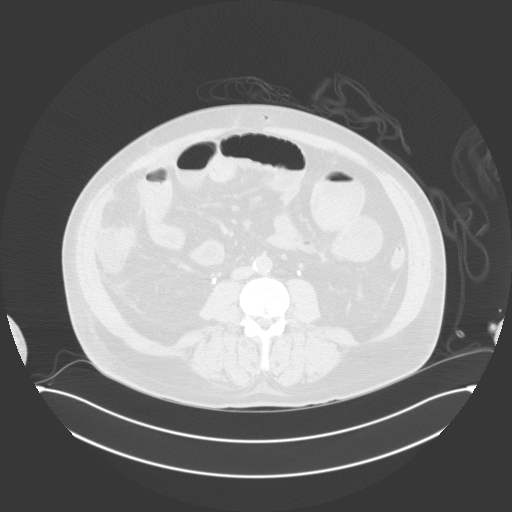

[15 of 32 positions shown; findings below may reference images not displayed]

EXAM:
CT GUIDED DRAINAGE OF  ABSCESS

MEDICATIONS:
None

ANESTHESIA/SEDATION:
3.0 mg IV Versed 100 mcg IV Fentanyl

Moderate Sedation Time:  10 minutes

The patient was continuously monitored during the procedure by the
interventional radiology nurse under my direct supervision.

COMPLICATIONS:
None
PROCEDURE:
The operative field was prepped with Chlorhexidine in a sterile
fashion, and a sterile drape was applied covering the operative
field. A sterile gown and sterile gloves were used for the
procedure. Local anesthesia was provided with 1% Lidocaine.

Once the patient was prepped and draped in the usual sterile fashion
1% lidocaine was used for local anesthesia.

Using CT guidance, trocar needle was advanced into the fluid
collection of the right lower quadrant. Modified Seldinger technique
was used to place a 10 French drain.

20 cc of thin amber fluid was aspirated for a culture.

30 cc of thin amber fluid was aspirated for cytology sample.

The drain was sutured in position and attached to bulb drainage.

Patient tolerated the procedure well and remained hemodynamically
stable throughout.

No complications were encountered and no significant blood loss.
FINDINGS: Initial CT demonstrates similar appearance of fluid and gas
collection within the right lower quadrant.

Ten French drain terminates within the fluid collection.

Approximately 50 cc of thin amber fluid aspirated with 30 cc sent
for cytology and 20 cc sent for culture.
IMPRESSION: Status post CT-guided drainage of right lower quadrant fluid
collection with sample sent for cytology and culture.

## 2022-09-29 IMAGING — CT CT CHEST W/ CM
2 of 4 series · 14 of 36 positions shown, 17 images · IV contrast (OMNIPAQUE)
Comparison: PET-CT 09/04/2006.

CLINICAL DATA: 60-year-old male with history of colorectal cancer
(goblet cell adenocarcinoma) with positive margins on surgical
specimen. Staging examination.

EXAM:
CT CHEST WITH CONTRAST
TECHNIQUE: Multidetector CT imaging of the chest was performed during
intravenous contrast administration.
CONTRAST:  75mL OMNIPAQUE IOHEXOL 300 MG/ML  SOLN

[Series 2: axial st · axial · 0.79mm/px · z∈[+1392,+1664]mm · 11 of 158 slices shown, 14 images]
[im 11/158  mediastinal]
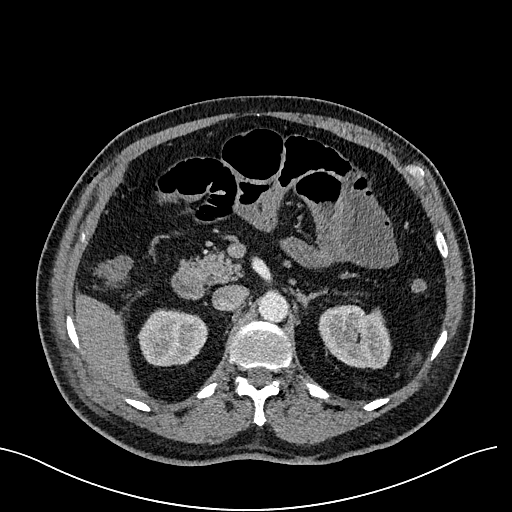
[im 11/158  lung]
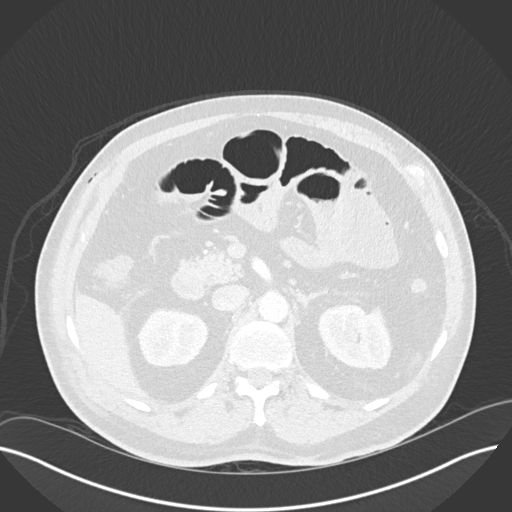
[im 21/158  lung]
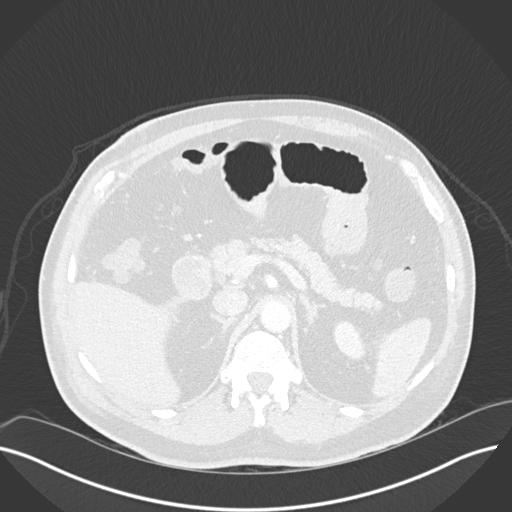
[im 42/158  lung]
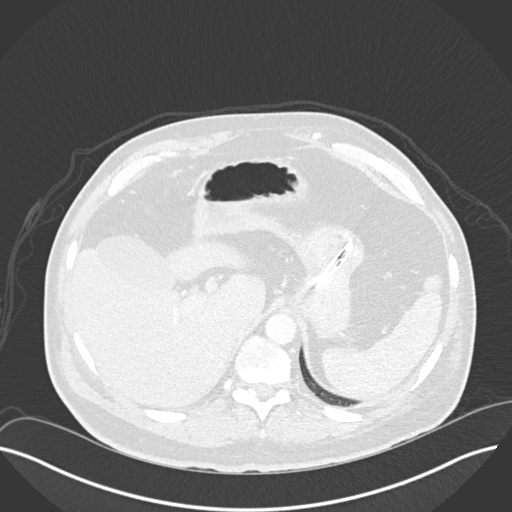
[im 53/158  lung]
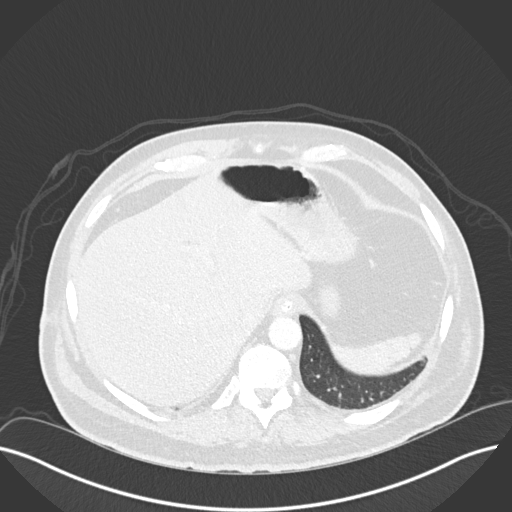
[im 63/158  mediastinal]
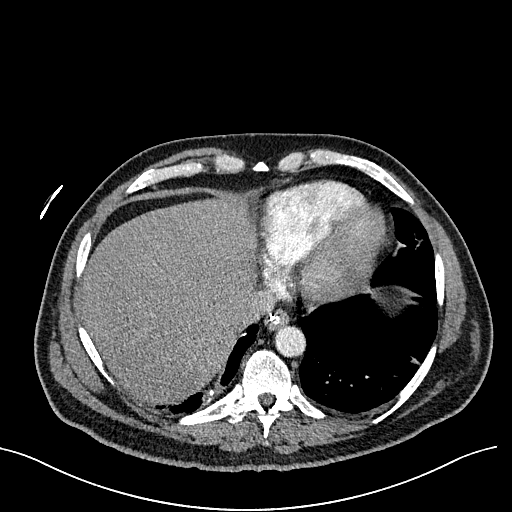
[im 63/158  lung]
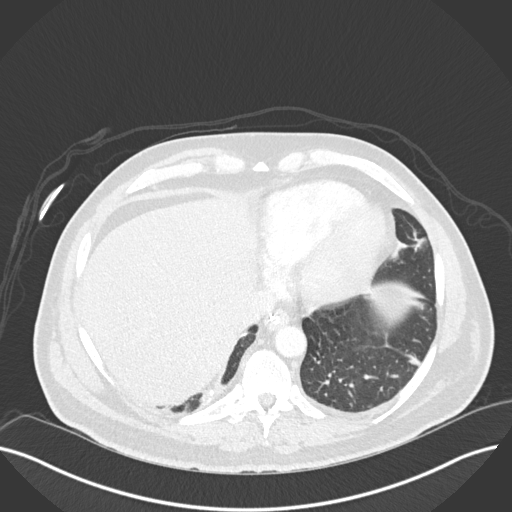
[im 84/158  lung]
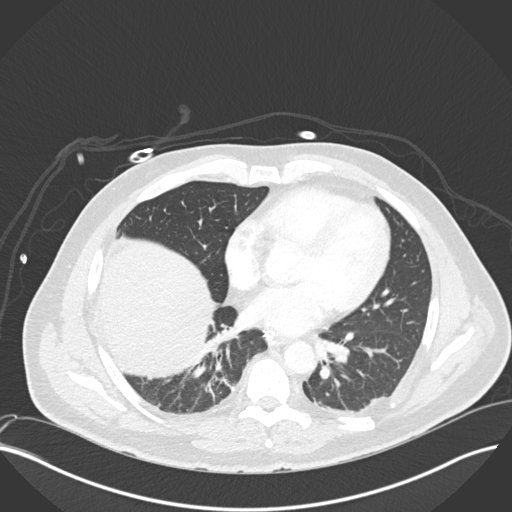
[im 95/158  lung]
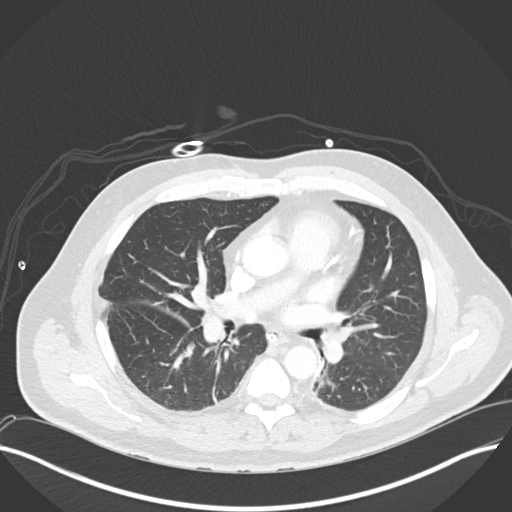
[im 105/158  lung]
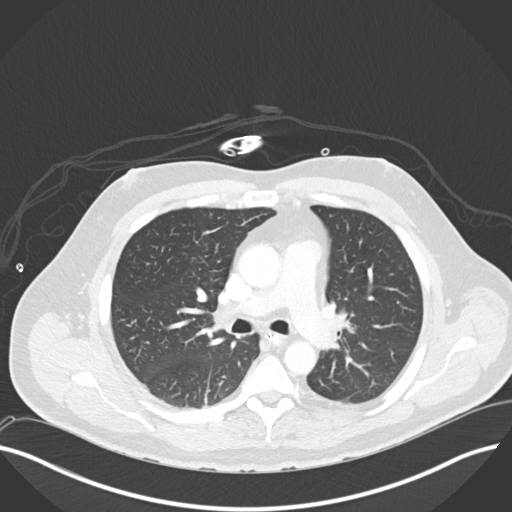
[im 116/158  mediastinal]
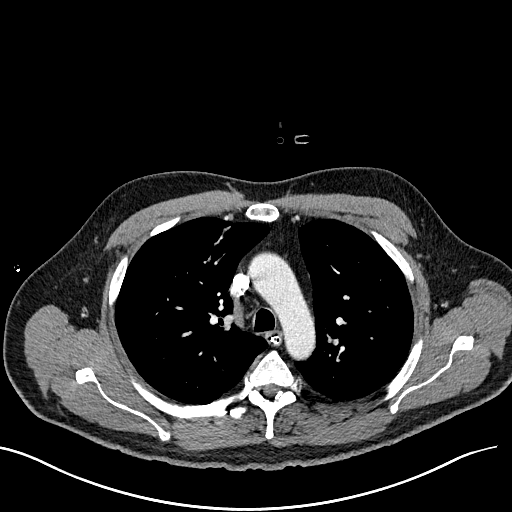
[im 116/158  lung]
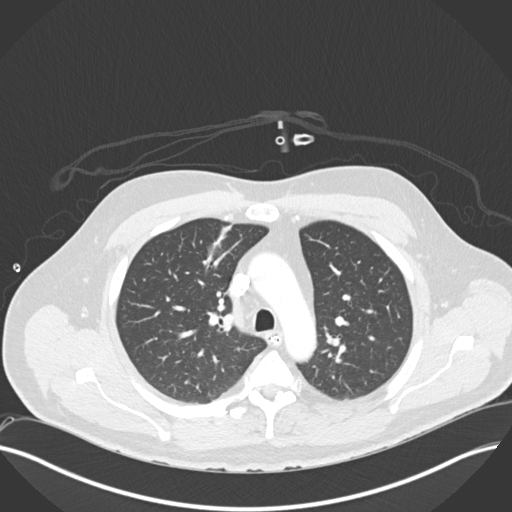
[im 137/158  lung]
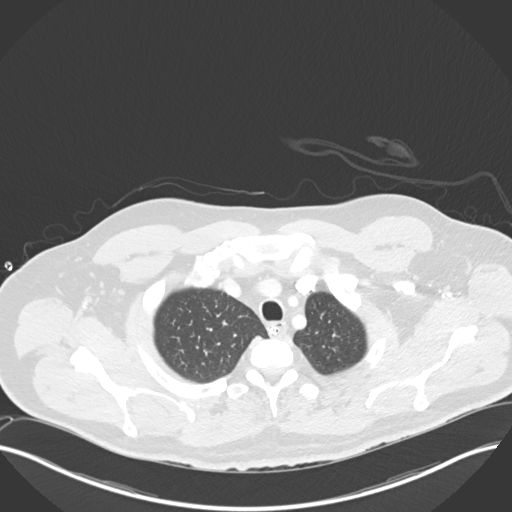
[im 147/158  lung]
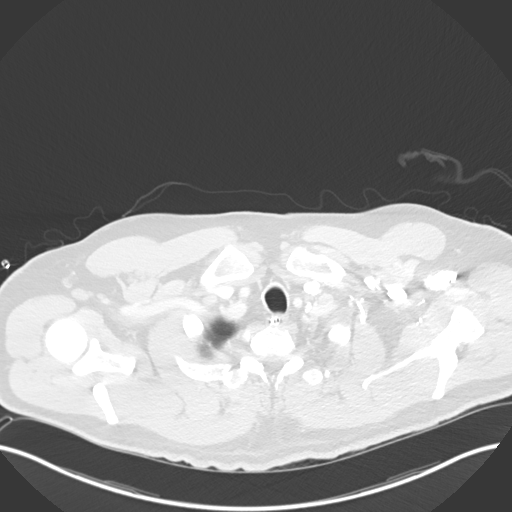

[Series 5: coronal · coronal · 0.66mm/px · 3 of 136 slices shown]
[im 28/136  lung]
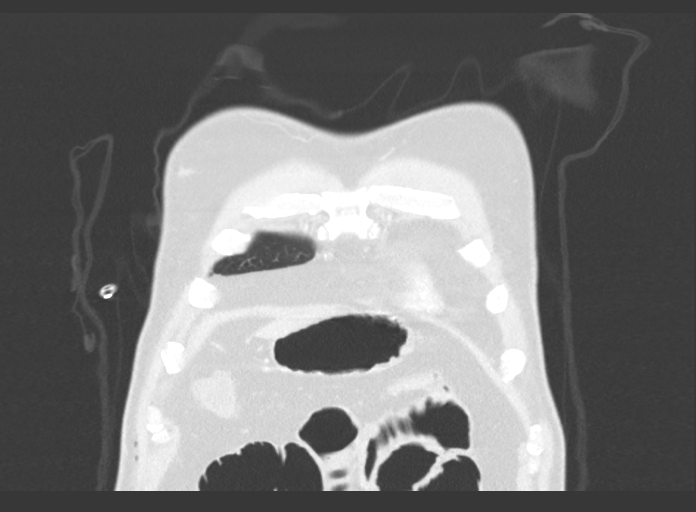
[im 55/136  lung]
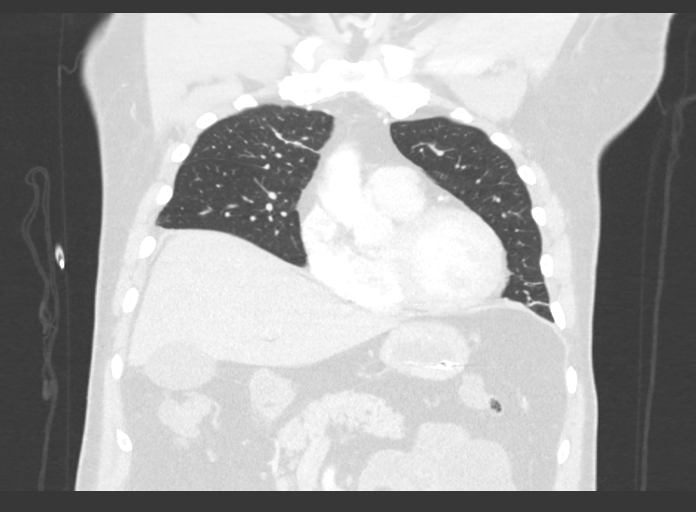
[im 82/136  lung]
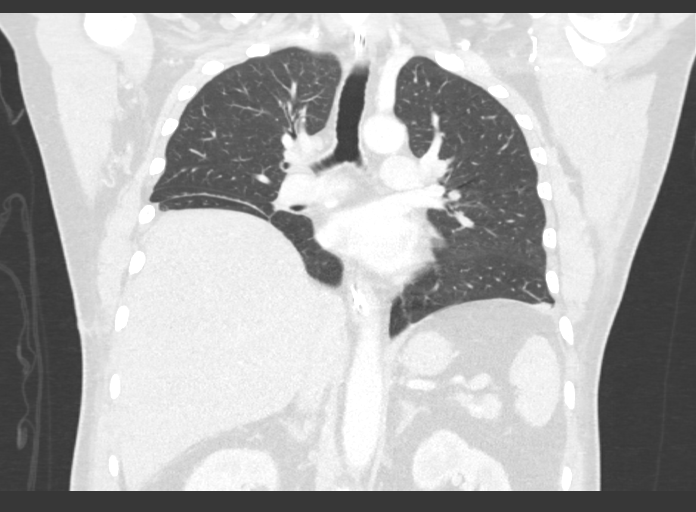

[14 of 36 positions shown; findings below may reference images not displayed]

FINDINGS: Cardiovascular: Heart size is normal. There is no significant
pericardial fluid, thickening or pericardial calcification. There is
aortic atherosclerosis, as well as atherosclerosis of the great
vessels of the mediastinum and the coronary arteries, including
calcified atherosclerotic plaque in the left main, left anterior
descending and right coronary arteries.

Mediastinum/Nodes: No pathologically enlarged mediastinal or hilar
lymph nodes. Nasogastric tube extending into the stomach. Esophagus
is otherwise unremarkable in appearance. No axillary
lymphadenopathy.

Lungs/Pleura: Postoperative changes of wedge resection in the left
lower lobe. Scattered areas of linear scarring are noted in the
lower lobes of the lungs bilaterally, similar to prior PET-CT from
3447. Linear scarring in the anterior aspect of the right upper lobe
also noted on today's examination. No acute consolidative airspace
disease. No pleural effusions. No suspicious appearing pulmonary
nodules or masses are noted.

Upper Abdomen: Please see separate dictation for contemporaneously
obtained CT the abdomen and pelvis 10/03/2020 for full description
of findings beneath the diaphragm.

Musculoskeletal: There are no aggressive appearing lytic or blastic
lesions noted in the visualized portions of the skeleton.
IMPRESSION: 1. No findings to suggest metastatic disease in the thorax.
2. Aortic atherosclerosis, in addition to left main and 2 vessel
coronary artery disease. Please note that although the presence of
coronary artery calcium documents the presence of coronary artery
disease, the severity of this disease and any potential stenosis
cannot be assessed on this non-gated CT examination. Assessment for
potential risk factor modification, dietary therapy or pharmacologic
therapy may be warranted, if clinically indicated.
3. Postoperative changes, support apparatus and additional
incidental findings, as above.

Aortic Atherosclerosis ([id number]).

## 2022-10-01 ENCOUNTER — Ambulatory Visit
Admission: RE | Admit: 2022-10-01 | Discharge: 2022-10-01 | Disposition: A | Discharge status: Home / Self Care | Payer: Self-pay | Source: Ambulatory Visit | Attending: Oncology | Admitting: Oncology

## 2022-10-01 ENCOUNTER — Other Ambulatory Visit: Payer: Self-pay | Admitting: *Deleted

## 2022-10-01 ENCOUNTER — Telehealth: Payer: Self-pay | Admitting: *Deleted

## 2022-10-01 DIAGNOSIS — C181 Malignant neoplasm of appendix: Secondary | ICD-10-CM

## 2022-10-01 NOTE — Telephone Encounter (Signed)
Dr. Benay Spice discussed CT w/patient and has ordered him to see NP on 3/19 at 0815. High priority scheduling message sent.

## 2022-10-01 NOTE — Telephone Encounter (Signed)
Left VM requesting MD call him to discuss his CT results from 09/30/22. Sent message to [facility] radiology requesting images be imported to [facility]

## 2022-10-04 IMAGING — XA IR FISTULA/SINUS TRACT
2 series · 8 of 8 positions shown · non-contrast
Comparison: none

INDICATION: Right lower quadrant postop abscess, appendicitis, pathology
positive for goblet cell adenocarcinoma

[Series 4: fl - angio · 4 of 110 frames shown]
[frame 17/110]
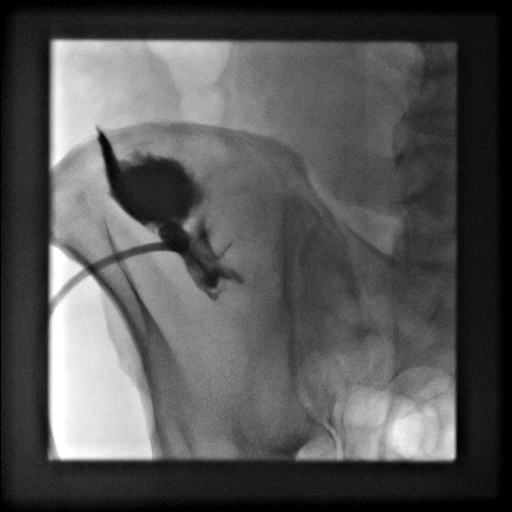
[frame 56/110]
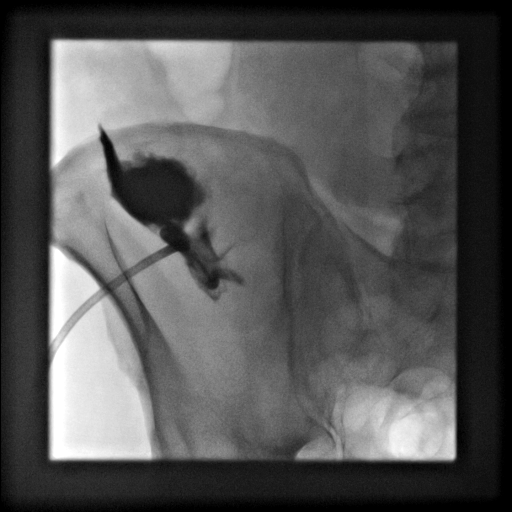
[frame 65/110]
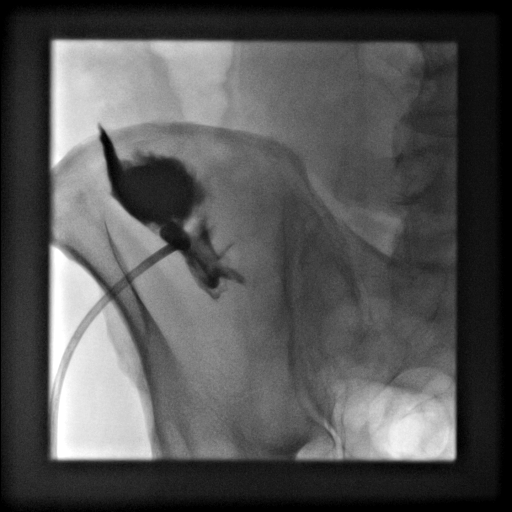
[frame 94/110]
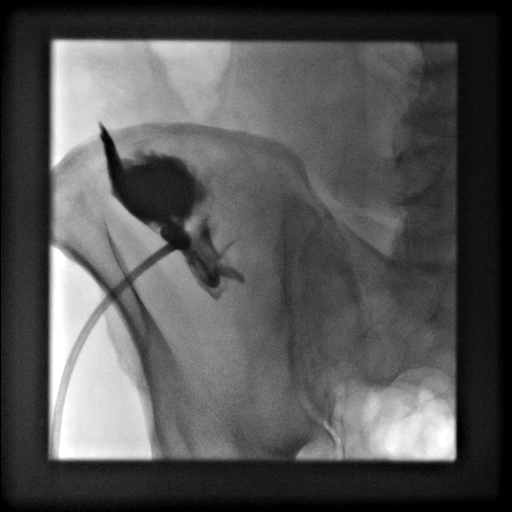

[Series 300: tube placements · 4 of 4 slices shown]
[im 1/4]
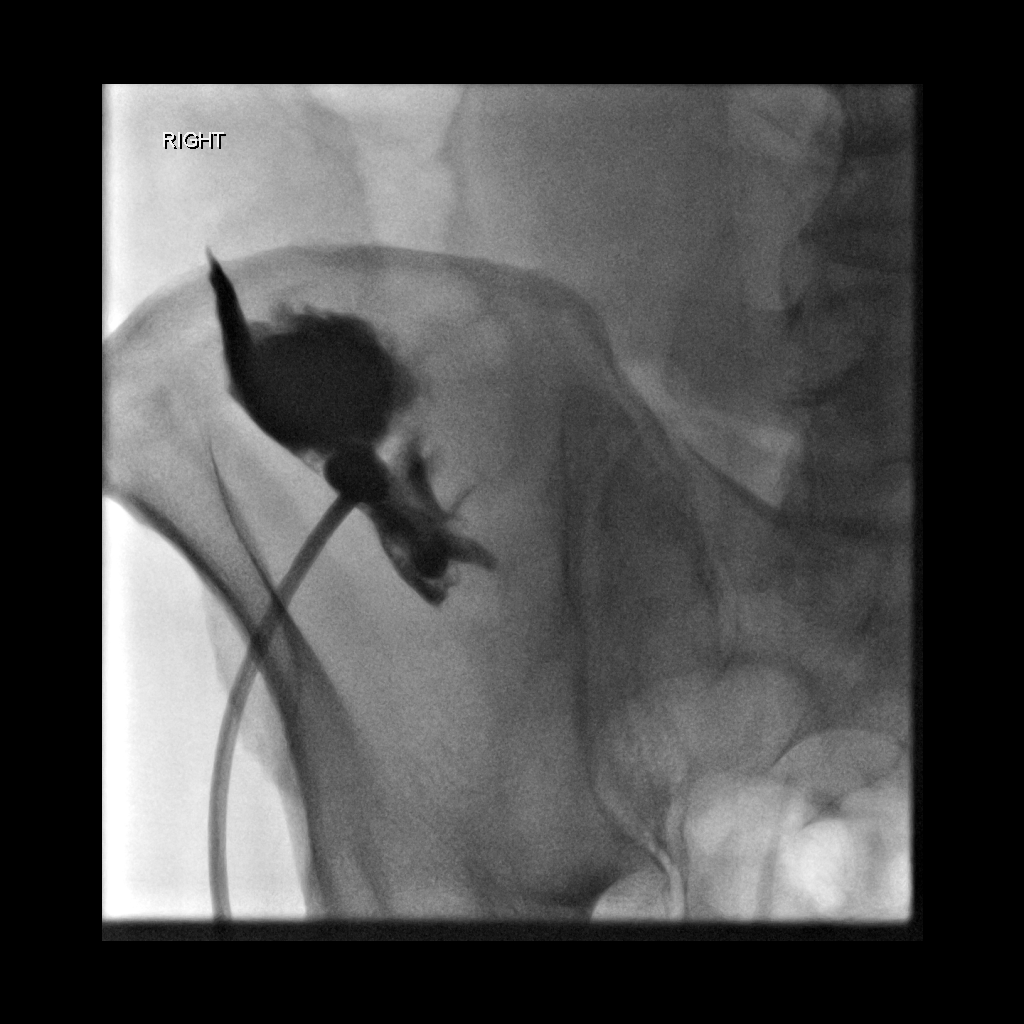
[im 2/4]
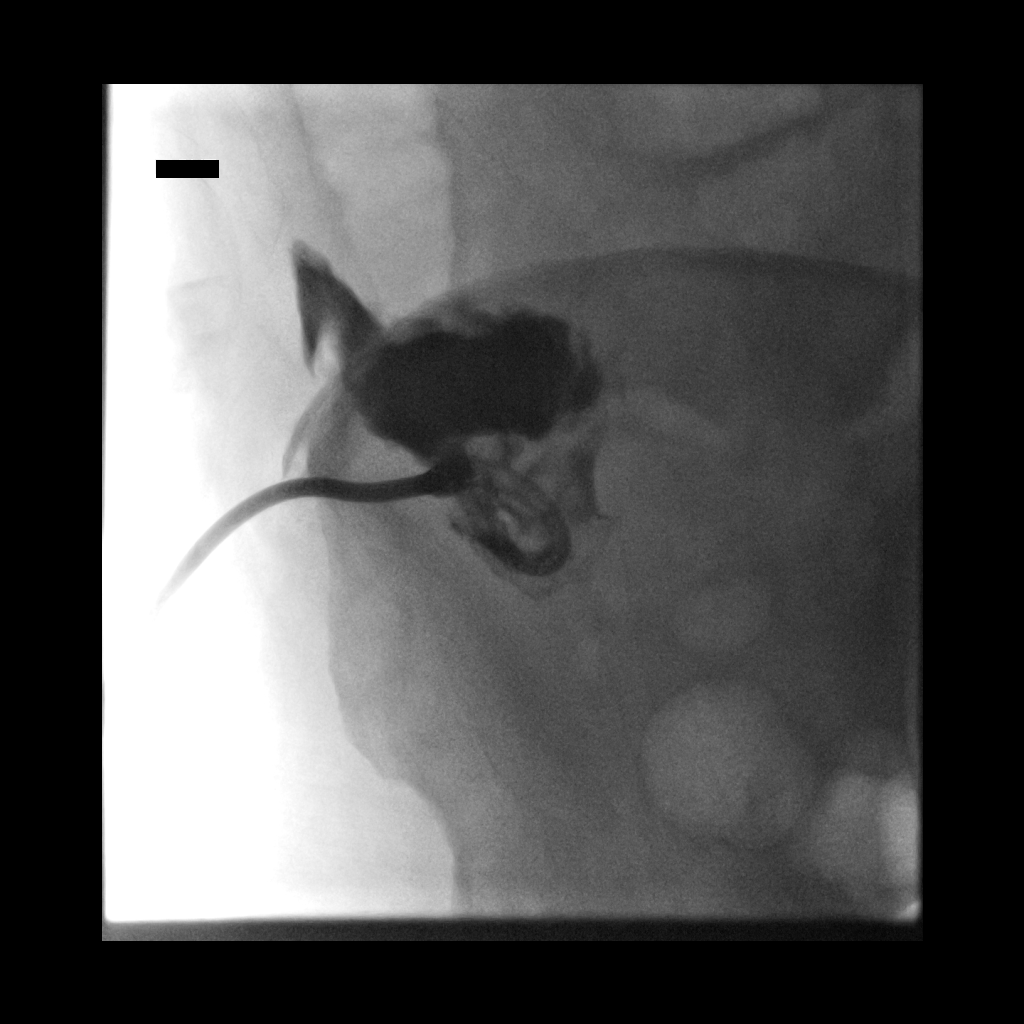
[im 3/4]
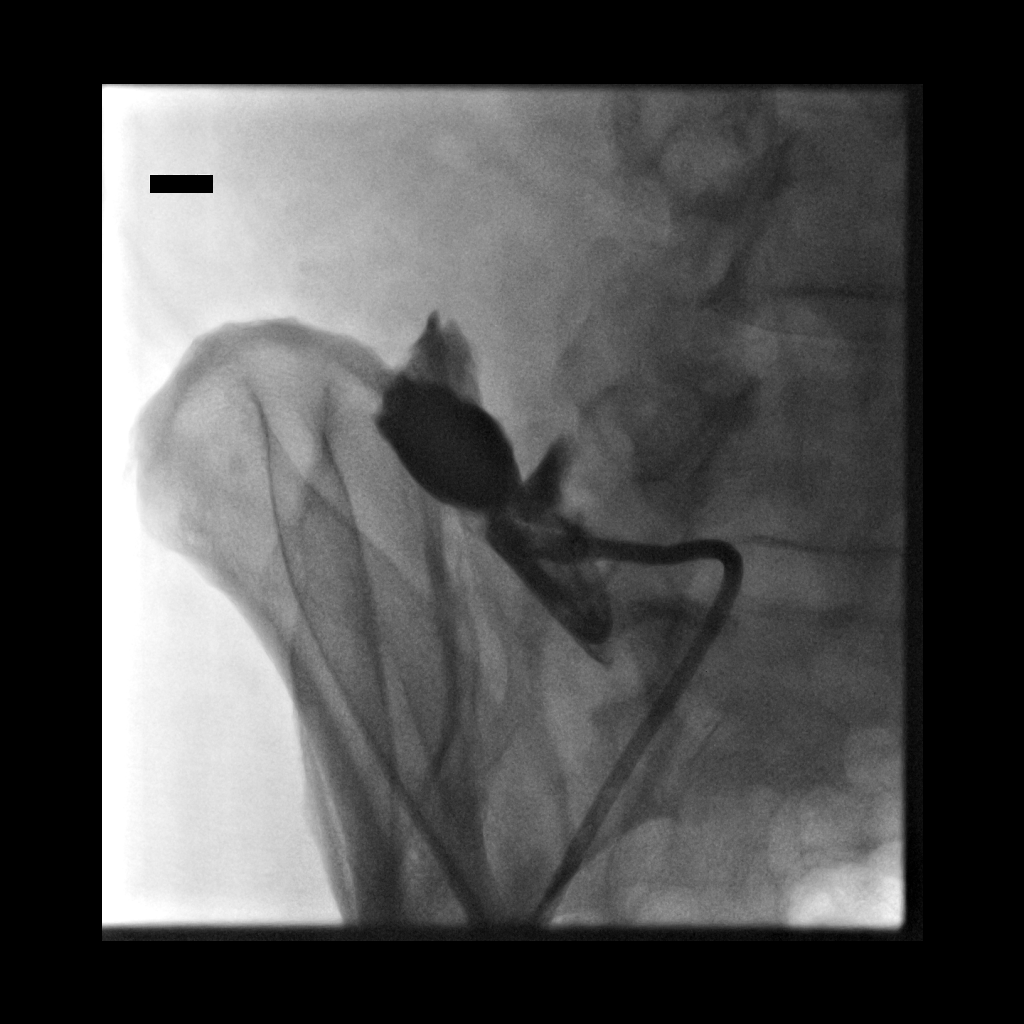
[im 4/4]
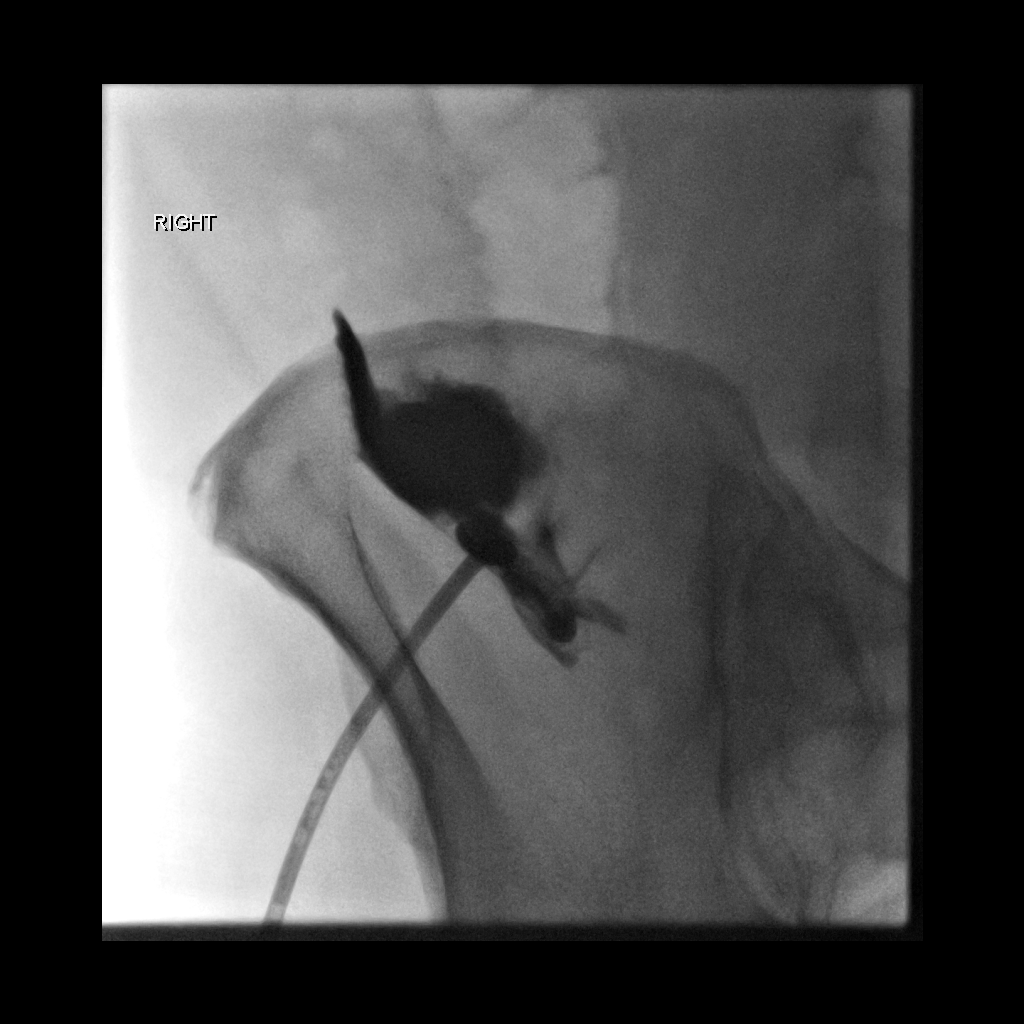

[8 of 8 positions shown; findings below may reference images not displayed]

EXAM:
FLUOROSCOPIC DRAIN INJECTION TO ASSESS FOR FISTULA

MEDICATIONS:
The patient is currently admitted to the hospital and receiving
intravenous antibiotics. The antibiotics were administered within an
appropriate time frame prior to the initiation of the procedure.

ANESTHESIA/SEDATION:
Moderate Sedation Time:  NONE.

The patient was continuously monitored during the procedure by the
interventional radiology nurse under my direct supervision.

COMPLICATIONS:
None immediate.

PROCEDURE:
Informed written consent was obtained from the patient after a
thorough discussion of the procedural risks, benefits and
alternatives. All questions were addressed. Maximal Sterile Barrier
Technique was utilized including caps, mask, sterile gowns, sterile
gloves, sterile drape, hand hygiene and skin antiseptic. A timeout
was performed prior to the initiation of the procedure.

existing right lower quadrant abscess drain was injected with
contrast. fluoroscopic imaging performed. abscess cavity is
collapsed. There is extravasation of contrast within the right lower
quadrant soft tissues but no connection to adjacent bowel. negative
for fistula.
IMPRESSION: Stable drain catheter position. Collapsed abscess cavity. Negative
for fistula.

Drain catheter was cut and removed without difficulty.

## 2022-10-07 ENCOUNTER — Encounter: Payer: Self-pay | Admitting: Nurse Practitioner

## 2022-10-07 ENCOUNTER — Inpatient Hospital Stay: Payer: 59 | Attending: Nurse Practitioner | Admitting: Nurse Practitioner

## 2022-10-07 VITALS — BP 143/90 | HR 60 | Temp 98.1°F | Resp 18 | Ht 69.0 in | Wt 208.6 lb

## 2022-10-07 DIAGNOSIS — Z85038 Personal history of other malignant neoplasm of large intestine: Secondary | ICD-10-CM | POA: Diagnosis present

## 2022-10-07 DIAGNOSIS — C181 Malignant neoplasm of appendix: Secondary | ICD-10-CM | POA: Diagnosis not present

## 2022-10-07 DIAGNOSIS — Z08 Encounter for follow-up examination after completed treatment for malignant neoplasm: Secondary | ICD-10-CM | POA: Insufficient documentation

## 2022-10-07 NOTE — Progress Notes (Signed)
[facility] OFFICE PROGRESS NOTE   Diagnosis: Appendix carcinoma  INTERVAL HISTORY:   Sean [name] returns for follow-up.  He feels well.  No abdominal pain.  Bowels moving regularly.  He is exercising.  He has a good appetite.  He is gaining weight.  Objective:  Vital signs in last 24 hours:  Blood pressure (!) 143/90, pulse 60, temperature 98.1 F (36.7 C), temperature source Oral, resp. rate 18, height 5\' 9"  (1.753 m), weight 208 lb 9.6 oz (94.6 kg), SpO2 98 %.    HEENT: No thrush or ulcers. Lymphatics: No palpable cervical, supraclavicular, axillary or inguinal lymph nodes. Resp: Lungs clear bilaterally. Cardio: Regular rate and rhythm. GI: Abdomen soft and nontender.  No hepatosplenomegaly.  No mass. Vascular: No leg edema.   Lab Results:  Lab Results  Component Value Date   WBC 5.4 10/01/2021   HGB 15.2 10/01/2021   HCT 44.8 10/01/2021   MCV 89.6 10/01/2021   PLT 214 10/01/2021   NEUTROABS 3.2 10/01/2021    Imaging:  No results found.  Medications: I have reviewed the patient's current medications.  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  Grade 1.  Proximal resection margin positive.  Focal disruption in the mid segment noted CT abdomen/pelvis [id number]- acute uncomplicated appendicitis CT chest 10/03/2020- no suspicious pulmonary nodules, no evidence of metastatic disease Colonoscopy 11/27/2020-normal appendiceal orifice, multiple polyps removed from the colon Right colectomy 11/28/2020- no evidence of metastatic disease, no residual goblet cell carcinoma, 23 negative lymph nodes Cycle 1 Xeloda 01/28/2021 Cycle 2 Xeloda 02/18/2021 Cycle 3 Xeloda 03/11/2021 Cycle 4 Xeloda 04/01/2021 Cycle 5 Xeloda 04/22/2021 Cycle 6 Xeloda 05/13/2021 Cycle 7 Xeloda 06/03/2021 Cycle 8  Xeloda 06/24/2021 CTs 10/01/2021-no evidence of recurrent disease CTs 09/30/2022-interval development of a fluid attenuating peritoneal deposit within the lower abdomen, increasing size and number of multiple mesenteric and ileocolic nodes.  Stable right lower lobe pulmonary nodule. 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  We reviewed the recent CT scan report/images with him at today's visit.  A new peritoneal nodule was noted within the lower abdomen.  The scan was done at an outside hospital.  We will review with radiology here, question if the nodule can be biopsied.  His case will be presented at an upcoming GI tumor conference.  We scheduled a follow-up visit in 3 weeks.  We can adjust to a sooner appointment if needed.  Patient seen with Dr. Benay Spice.  Ned Card ANP/GNP-BC   10/07/2022  8:53 AM This was a shared visit with Ned Card.  Sean [name] was interviewed and examined.  We reviewed the CT findings and images with him.  I discussed the CT findings with him by telephone last week.  There is a new anterior peritoneal nodule and an increase in the number of mesenteric lymph nodes.  We discussed the differential diagnosis including recurrence of  the appendix carcinoma and recurrent melanoma.  I will present his case at the GI tumor conference to  discuss the indication for biopsy of the peritoneal nodule.  We also discuss PET staging.  I was present for greater than 50% of today's visit.  I performed medical decision making.  Julieanne Manson, MD

## 2022-10-08 ENCOUNTER — Other Ambulatory Visit: Payer: Self-pay | Admitting: Nurse Practitioner

## 2022-10-08 ENCOUNTER — Other Ambulatory Visit: Payer: Self-pay

## 2022-10-08 ENCOUNTER — Telehealth: Payer: Self-pay | Admitting: Nurse Practitioner

## 2022-10-08 ENCOUNTER — Other Ambulatory Visit: Payer: Managed Care, Other (non HMO)

## 2022-10-08 ENCOUNTER — Ambulatory Visit (HOSPITAL_BASED_OUTPATIENT_CLINIC_OR_DEPARTMENT_OTHER): Payer: Managed Care, Other (non HMO)

## 2022-10-08 DIAGNOSIS — C439 Malignant melanoma of skin, unspecified: Secondary | ICD-10-CM

## 2022-10-08 DIAGNOSIS — C181 Malignant neoplasm of appendix: Secondary | ICD-10-CM

## 2022-10-08 NOTE — Telephone Encounter (Signed)
I called Sean [name] regarding tumor conference recommendations.  His case was presented this morning.  Recommendation is to proceed with a PET scan.  Order has been placed and we are working on getting this scheduled.

## 2022-10-08 NOTE — Progress Notes (Signed)
The proposed treatment discussed in conference is for discussion purpose only and is not a binding recommendation.  The patients have not been physically examined, or presented with their treatment options.  Therefore, final treatment plans cannot be decided.  

## 2022-10-09 ENCOUNTER — Ambulatory Visit (INDEPENDENT_AMBULATORY_CARE_PROVIDER_SITE_OTHER): Payer: 59 | Admitting: Family Medicine

## 2022-10-09 ENCOUNTER — Encounter: Payer: Self-pay | Admitting: Family Medicine

## 2022-10-09 VITALS — BP 119/70 | HR 49 | Temp 97.8°F | Ht 69.0 in | Wt 208.0 lb

## 2022-10-09 DIAGNOSIS — Z0000 Encounter for general adult medical examination without abnormal findings: Secondary | ICD-10-CM

## 2022-10-09 DIAGNOSIS — Z125 Encounter for screening for malignant neoplasm of prostate: Secondary | ICD-10-CM | POA: Diagnosis not present

## 2022-10-09 DIAGNOSIS — E7800 Pure hypercholesterolemia, unspecified: Secondary | ICD-10-CM

## 2022-10-09 DIAGNOSIS — R7303 Prediabetes: Secondary | ICD-10-CM

## 2022-10-09 LAB — COMPREHENSIVE METABOLIC PANEL
ALT: 23 U/L (ref 0–53)
AST: 18 U/L (ref 0–37)
Albumin: 4.3 g/dL (ref 3.5–5.2)
Alkaline Phosphatase: 44 U/L (ref 39–117)
BUN: 14 mg/dL (ref 6–23)
CO2: 30 mEq/L (ref 19–32)
Calcium: 8.9 mg/dL (ref 8.4–10.5)
Chloride: 101 mEq/L (ref 96–112)
Creatinine, Ser: 0.85 mg/dL (ref 0.40–1.50)
GFR: 94.05 mL/min (ref >60.00)
Glucose, Bld: 99 mg/dL (ref 70–99)
Potassium: 4.2 mEq/L (ref 3.5–5.1)
Sodium: 139 mEq/L (ref 135–145)
Total Bilirubin: 0.5 mg/dL (ref 0.2–1.2)
Total Protein: 7.1 g/dL (ref 6.0–8.3)

## 2022-10-09 LAB — LIPID PANEL
Cholesterol: 217 mg/dL — ABNORMAL HIGH (ref 0–200)
HDL: 50 mg/dL (ref >39.00)
LDL Cholesterol: 136 mg/dL — ABNORMAL HIGH (ref 0–99)
NonHDL: 167.28
Total CHOL/HDL Ratio: 4
Triglycerides: 158 mg/dL — ABNORMAL HIGH (ref 0.0–149.0)
VLDL: 31.6 mg/dL (ref 0.0–40.0)

## 2022-10-09 LAB — TSH: TSH: 3 u[IU]/mL (ref 0.35–5.50)

## 2022-10-09 LAB — CBC
HCT: 43.9 % (ref 39.0–52.0)
Hemoglobin: 14.9 g/dL (ref 13.0–17.0)
MCHC: 33.9 g/dL (ref 30.0–36.0)
MCV: 86.9 fl (ref 78.0–100.0)
Platelets: 225 10*3/uL (ref 150.0–400.0)
RBC: 5.06 Mil/uL (ref 4.22–5.81)
RDW: 12.9 % (ref 11.5–15.5)
WBC: 5.4 10*3/uL (ref 4.0–10.5)

## 2022-10-09 LAB — HEMOGLOBIN A1C: Hgb A1c MFr Bld: 6.3 % (ref 4.6–6.5)

## 2022-10-09 LAB — PSA: PSA: 0.99 ng/mL (ref 0.10–4.00)

## 2022-10-09 NOTE — Patient Instructions (Signed)
Health Maintenance, Male Adopting a healthy lifestyle and getting preventive care are important in promoting health and wellness. Ask your health care provider about: The right schedule for you to have regular tests and exams. Things you can do on your own to prevent diseases and keep yourself healthy. What should I know about diet, weight, and exercise? Eat a healthy diet  Eat a diet that includes plenty of vegetables, fruits, low-fat dairy products, and lean protein. Do not eat a lot of foods that are high in solid fats, added sugars, or sodium. Maintain a healthy weight Body mass index (BMI) is a measurement that can be used to identify possible weight problems. It estimates body fat based on height and weight. Your health care provider can help determine your BMI and help you achieve or maintain a healthy weight. Get regular exercise Get regular exercise. This is one of the most important things you can do for your health. Most adults should: Exercise for at least 150 minutes each week. The exercise should increase your heart rate and make you sweat (moderate-intensity exercise). Do strengthening exercises at least twice a week. This is in addition to the moderate-intensity exercise. Spend less time sitting. Even light physical activity can be beneficial. Watch cholesterol and blood lipids Have your blood tested for lipids and cholesterol at 61 years of age, then have this test every 5 years. You may need to have your cholesterol levels checked more often if: Your lipid or cholesterol levels are high. You are older than 61 years of age. You are at high risk for heart disease. What should I know about cancer screening? Many types of cancers can be detected early and may often be prevented. Depending on your health history and family history, you may need to have cancer screening at various ages. This may include screening for: Colorectal cancer. Prostate cancer. Skin cancer. Lung  cancer. What should I know about heart disease, diabetes, and high blood pressure? Blood pressure and heart disease High blood pressure causes heart disease and increases the risk of stroke. This is more likely to develop in people who have high blood pressure readings or are overweight. Talk with your health care provider about your target blood pressure readings. Have your blood pressure checked: Every 3-5 years if you are 18-39 years of age. Every year if you are 40 years old or older. If you are between the ages of 65 and 75 and are a current or former smoker, ask your health care provider if you should have a one-time screening for abdominal aortic aneurysm (AAA). Diabetes Have regular diabetes screenings. This checks your fasting blood sugar level. Have the screening done: Once every three years after age 45 if you are at a normal weight and have a low risk for diabetes. More often and at a younger age if you are overweight or have a high risk for diabetes. What should I know about preventing infection? Hepatitis B If you have a higher risk for hepatitis B, you should be screened for this virus. Talk with your health care provider to find out if you are at risk for hepatitis B infection. Hepatitis C Blood testing is recommended for: Everyone born from 1945 through 1965. Anyone with known risk factors for hepatitis C. Sexually transmitted infections (STIs) You should be screened each year for STIs, including gonorrhea and chlamydia, if: You are sexually active and are younger than 61 years of age. You are older than 61 years of age and your   health care provider tells you that you are at risk for this type of infection. Your sexual activity has changed since you were last screened, and you are at increased risk for chlamydia or gonorrhea. Ask your health care provider if you are at risk. Ask your health care provider about whether you are at high risk for HIV. Your health care provider  may recommend a prescription medicine to help prevent HIV infection. If you choose to take medicine to prevent HIV, you should first get tested for HIV. You should then be tested every 3 months for as long as you are taking the medicine. Follow these instructions at home: Alcohol use Do not drink alcohol if your health care provider tells you not to drink. If you drink alcohol: Limit how much you have to 0-2 drinks a day. Know how much alcohol is in your drink. In the [place] one drink equals one 12 oz bottle of beer (355 mL), one 5 oz glass of wine (148 mL), or one 1 oz glass of hard liquor (44 mL). Lifestyle Do not use any products that contain nicotine or tobacco. These products include cigarettes, chewing tobacco, and vaping devices, such as e-cigarettes. If you need help quitting, ask your health care provider. Do not use street drugs. Do not share needles. Ask your health care provider for help if you need support or information about quitting drugs. General instructions Schedule regular health, dental, and eye exams. Stay current with your vaccines. Tell your health care provider if: You often feel depressed. You have ever been abused or do not feel safe at home. Summary Adopting a healthy lifestyle and getting preventive care are important in promoting health and wellness. Follow your health care provider's instructions about healthy diet, exercising, and getting tested or screened for diseases. Follow your health care provider's instructions on monitoring your cholesterol and blood pressure. This information is not intended to replace advice given to you by your health care provider. Make sure you discuss any questions you have with your health care provider. Document Revised: 11/26/2020 Document Reviewed: 11/26/2020 Elsevier Patient Education  2023 [employer]  

## 2022-10-09 NOTE — Progress Notes (Signed)
Office Note 10/09/2022  CC:  Chief Complaint  Patient presents with   Annual Exam    Pt is fasting   Patient is a 62 y.o. male who is here for annual health maintenance exam and follow-up prediabetes and hyperlipidemia. A/P as of last visit: "1 prediabetes. Hemoglobin A1c 5.8% today.  Continue good dietary and exercise habits.   2.  Hyperlipidemia.  He has declined medications in the past and we discussed possible reconsideration of this medication in light of the coronary calcifications and aortic atherosclerosis noted on his recent CT. He still said he does not want to do any medications for this. Encouraged low-fat diet and weight loss to try to help his hepatic steatosis.   #3 vitamin D deficiency.  He continues on 2000 units vitamin D daily.  We will recheck vitamin D level today."  INTERIM HX: Feeling well.  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  His oncologist and surgeon are further investigation with a PET scan.  I think a biopsy is also planned.   Past Medical History:  Diagnosis Date   Arthritis    lower back   Asymmetrical sensorineural hearing loss    L>>R: ENT eval 04/2018.  Pt declined MRI brain at that time.   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  CT chest neg for metastatic dz, CEA neg. R hemicolectomy with iliocolonic anastomosis 11/2020.   Coronary artery calcification    and aortic atherosclerosis.  Noted on 09/2021 CT that was done for cancer surveillance   Hepatic steatosis    noted on CT 09/2021 for cancer surveillance   History of adenomatous polyp of colon 07/2016   History of vitamin D deficiency    still present as of 09/2017.  High dose vit D started 10/16/17.  Increased dose to 50K twice per week 02/18/18.   Hyperlipidemia     Mild, never meds.  Patient prefers no meds for this.   Metastatic malignant melanoma ([place]) 1989   Recurrence L upper arm 1993.  Then left back and LUL lung recurrence 1995-resected.  Brain lesion recurrence 1996-resected.  RLL lung lesion 1996--chemo & then resection.  Small bowel recurrence resected 1997, then mesenteric lesion resected 1998.  No sign of recurrence since 1998.  Released by onc/Dr. Magrinot as of 12/2015 (needs annual dermatologist exam and annual ophth exam).   Polyposis of colon    Prediabetes    A1c 5.9% 12/2014.  6.2% 09/2017. 6.3% 04/2019.    Past Surgical History:  Procedure Laterality Date   APPENDECTOMY     BRAIN SURGERY     Removal of melanoma met   COLON SURGERY  11/2020   Proximal hemicolectomy and lysis of adhesions and repair of incarcerated incisional hernia.  For colon ca resection.  iliocolonic anastomosis.   COLONOSCOPY  2009; 07/2016   11/27/20->adenoma   COLONOSCOPY WITH PROPOFOL N/A 08/18/2016   Tubular adenoma.  Recall 3 yrs.  Procedure: COLONOSCOPY WITH PROPOFOL;  Surgeon: Garlan Fair, MD;  Location: [facility];  Service: Endoscopy;  Laterality: N/A;   IR SINUS/FIST TUBE CHK-NON GI  10/08/2020   LAPAROSCOPIC APPENDECTOMY N/A 09/28/2020   Procedure: APPENDECTOMY LAPAROSCOPIC;  Surgeon: Erroll Luna, MD;  Location: [facility];  Service: General;  Laterality: N/A;   LUNG SURGERY     "              "          "        "  SMALL INTESTINE SURGERY  1998   x2 from melanoma    Family History  Problem Relation Age of Onset   Breast cancer Mother 42       metastatic   Alcohol abuse Father    Thyroid cancer Sister 45   Colon cancer Neg Hx    Esophageal cancer Neg Hx    Stomach cancer Neg Hx    Rectal cancer Neg Hx     Social History   Socioeconomic History   Marital status: Divorced    Spouse name: Not on file   Number of children: 2   Years of education: Not on file   Highest education level: Not on file  Occupational History    Employer:  [employer]    Comment: Driver  Tobacco Use   Smoking status: Never   Smokeless tobacco: Former    Quit date: 2021  Vaping Use   Vaping Use: Never used  Substance and Sexual Activity   Alcohol use: Not Currently    Comment: 1 drink/ 2 weeks   Drug use: No   Sexual activity: Yes  Other Topics Concern   Not on file  Social History Narrative   Married, 2 sons.   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   No T/A/Ds.   Diet ok except fast foods.   Social Determinants of Health   Financial Resource Strain: Not on file  Food Insecurity: Not on file  Transportation Needs: Not on file  Physical Activity: Sufficiently Active (04/09/2022)   Exercise Vital Sign    Days of Exercise per Week: 7 days    Minutes of Exercise per Session: 150+ min  Stress: Not on file  Social Connections: Unknown (04/09/2022)   Social Connection and Isolation Panel [NHANES]    Frequency of Communication with Friends and Family: Not on file    Frequency of Social Gatherings with Friends and Family: Not on file    Attends Religious Services: Not on file    Active Member of Clubs or Organizations: Not on file    Attends Archivist Meetings: Not on file    Marital Status: Divorced  Intimate Partner Violence: Not on file    Outpatient Medications Prior to Visit  Medication Sig Dispense Refill   Cholecalciferol (VITAMIN D3) 50 MCG (2000 UT) TABS Take 2,000 Units by mouth in the morning.     Omega-3 Fatty Acids (FISH OIL) 1000 MG CAPS Take by mouth. Taking 1200 mg     No facility-administered medications prior to visit.    Allergies  Allergen Reactions   Aspirin Nausea And Vomiting   Penicillins Other (See Comments)    Childhood  Has patient had a PCN reaction causing immediate rash, facial/tongue/throat swelling, SOB or lightheadedness with hypotension: unknown Has patient had a PCN reaction causing severe rash involving mucus membranes or skin necrosis: {unkjnown Has patient had a PCN  reaction that required hospitalization {no Has patient had a PCN reaction occurring within the last 10 years: no If all of the above answers are "NO", then may proceed with Cephalosporin use.    Review of Systems  Constitutional:  Negative for appetite change, chills, fatigue and fever.  HENT:  Negative for congestion, dental problem, ear pain and sore throat.   Eyes:  Negative for discharge, redness and visual disturbance.  Respiratory:  Negative for cough, chest tightness, shortness of breath and wheezing.   Cardiovascular:  Negative for chest pain, palpitations and leg swelling.  Gastrointestinal:  Negative for abdominal pain, blood in stool, diarrhea, nausea and vomiting.  Genitourinary:  Negative for difficulty urinating, dysuria, flank pain, frequency, hematuria and urgency.  Musculoskeletal:  Negative for arthralgias, back pain, joint swelling, myalgias and neck stiffness.  Skin:  Negative for pallor and rash.  Neurological:  Negative for dizziness, speech difficulty, weakness and headaches.  Hematological:  Negative for adenopathy. Does not bruise/bleed easily.  Psychiatric/Behavioral:  Negative for confusion and sleep disturbance. The patient is not nervous/anxious.     PE;    10/09/2022    9:29 AM 10/07/2022    8:27 AM 04/18/2022    9:46 AM  Vitals with BMI  Height 5\' 9"  5\' 9"  5\' 9"   Weight 208 lbs 208 lbs 10 oz 205 lbs  BMI 30.7 [id number]  Systolic [id number]  Diastolic 70 90 82  Pulse 49 60 60    Gen: Alert, well appearing.  Patient is oriented to person, place, time, and situation. AFFECT: pleasant, lucid thought and speech. ENT: Ears: EACs clear, normal epithelium.  TMs with good light reflex and landmarks bilaterally.  Eyes: no injection, icteris, swelling, or exudate.  EOMI, PERRLA. Nose: no drainage or turbinate edema/swelling.  No injection or focal lesion.  Mouth: lips without lesion/swelling.  Oral mucosa pink and moist.  Dentition intact and without obvious  caries or gingival swelling.  Oropharynx without erythema, exudate, or swelling.  Neck: supple/nontender.  No LAD, mass, or TM.  Carotid pulses 2+ bilaterally, without bruits. CV: RRR, no m/r/g.   LUNGS: CTA bilat, nonlabored resps, good aeration in all lung fields. ABD: soft, NT, ND, BS normal.  No hepatospenomegaly or mass.  No bruits. EXT: no clubbing, cyanosis, or edema.  Musculoskeletal: no joint swelling, erythema, warmth, or tenderness.  ROM of all joints intact. Skin - no sores or suspicious lesions or rashes or color changes  Pertinent labs:  Lab Results  Component Value Date   TSH 3.09 07/16/2020   Lab Results  Component Value Date   WBC 5.4 10/01/2021   HGB 15.2 10/01/2021   HCT 44.8 10/01/2021   MCV 89.6 10/01/2021   PLT 214 10/01/2021   Lab Results  Component Value Date   CREATININE 0.84 04/10/2022   BUN 16 04/10/2022   NA 138 04/10/2022   K 4.4 04/10/2022   CL 102 04/10/2022   CO2 28 04/10/2022   Lab Results  Component Value Date   ALT 27 04/10/2022   AST 19 04/10/2022   ALKPHOS 45 04/10/2022   BILITOT 0.7 04/10/2022   Lab Results  Component Value Date   CHOL 202 (H) 04/10/2022   Lab Results  Component Value Date   HDL 46.70 04/10/2022   Lab Results  Component Value Date   LDLCALC 131 (H) 04/10/2022   Lab Results  Component Value Date   TRIG 123.0 04/10/2022   Lab Results  Component Value Date   CHOLHDL 4 04/10/2022   Lab Results  Component Value Date   PSA 0.61 10/03/2021   PSA 0.80 07/16/2020   PSA 0.68 05/05/2019   Lab Results  Component Value Date   HGBA1C 5.8 (A) 04/10/2022   HGBA1C 5.8 04/10/2022   HGBA1C 5.8 04/10/2022   HGBA1C 5.8 04/10/2022   ASSESSMENT AND PLAN:   #1 health maintenance exam: Reviewed age and gender appropriate health maintenance issues (prudent diet, regular exercise, health risks of tobacco and excessive alcohol, use of seatbelts, fire alarms in home, use of sunscreen).  Also reviewed age and gender  appropriate health screening as well as vaccine recommendations. Vaccines:  Shingrix--pt declined.  Otherwise up-to-date. Labs: HP, A1c, PSA Prostate ca screening: PSA today Colon ca screening: hx adenomatous polyps, most recent colonoscopy 11/27/20, further timing of colonoscopies per GI.  #2 Hyperlipidemia.  He has declined medications in the past and we have discussed possible reconsideration of this medication in light of the coronary calcifications and aortic atherosclerosis noted on his past CT. He still said he does not want to do any medications for this. Encouraged low-fat diet and weight loss to try to help his hepatic steatosis.  #3 Prediabetes. Hba1c and fasting glucose today.  An After Visit Summary was printed and given to the patient.  FOLLOW UP:  Return in about 6 months (around 04/11/2023) for routine chronic illness f/u.  Signed:  Crissie Sickles, MD           10/09/2022

## 2022-10-10 ENCOUNTER — Inpatient Hospital Stay: Payer: 59 | Admitting: Oncology

## 2022-10-14 ENCOUNTER — Encounter: Payer: Self-pay | Admitting: Oncology

## 2022-10-24 ENCOUNTER — Ambulatory Visit (HOSPITAL_COMMUNITY)
Admission: RE | Admit: 2022-10-24 | Discharge: 2022-10-24 | Disposition: A | Discharge status: Home / Self Care | Payer: 59 | Source: Ambulatory Visit | Attending: Nurse Practitioner | Admitting: Nurse Practitioner

## 2022-10-24 DIAGNOSIS — C439 Malignant melanoma of skin, unspecified: Secondary | ICD-10-CM | POA: Diagnosis present

## 2022-10-24 DIAGNOSIS — C181 Malignant neoplasm of appendix: Secondary | ICD-10-CM | POA: Insufficient documentation

## 2022-10-24 LAB — GLUCOSE, CAPILLARY: Glucose-Capillary: 99 mg/dL (ref 70–99)

## 2022-10-24 MED ORDER — FLUDEOXYGLUCOSE F - 18 (FDG) INJECTION
10.2700 | Freq: Once | INTRAVENOUS | Status: AC | PRN
  Administered 2022-10-24: 10.27 via INTRAVENOUS

## 2022-10-29 ENCOUNTER — Inpatient Hospital Stay: Payer: 59 | Attending: Oncology | Admitting: Oncology

## 2022-10-29 ENCOUNTER — Other Ambulatory Visit: Payer: Self-pay

## 2022-10-29 VITALS — BP 130/81 | HR 60 | Temp 98.1°F | Resp 20 | Ht 69.0 in | Wt 206.6 lb

## 2022-10-29 DIAGNOSIS — R911 Solitary pulmonary nodule: Secondary | ICD-10-CM | POA: Diagnosis not present

## 2022-10-29 DIAGNOSIS — C181 Malignant neoplasm of appendix: Secondary | ICD-10-CM

## 2022-10-29 DIAGNOSIS — R590 Localized enlarged lymph nodes: Secondary | ICD-10-CM | POA: Insufficient documentation

## 2022-10-29 DIAGNOSIS — Z85038 Personal history of other malignant neoplasm of large intestine: Secondary | ICD-10-CM | POA: Insufficient documentation

## 2022-10-29 DIAGNOSIS — Z08 Encounter for follow-up examination after completed treatment for malignant neoplasm: Secondary | ICD-10-CM | POA: Insufficient documentation

## 2022-10-29 DIAGNOSIS — Z85820 Personal history of malignant melanoma of skin: Secondary | ICD-10-CM | POA: Diagnosis not present

## 2022-10-29 NOTE — Progress Notes (Signed)
[facility] OFFICE PROGRESS NOTE   Diagnosis: Appendix carcinoma, history of melanoma  INTERVAL HISTORY:   Sean [name] returns as scheduled.  He feels well.  No complaint.  He recently developed "poison ivy "over his legs after working in the woods.  He has a wrap in place at the lower leg bilaterally.  Objective:  Vital signs in last 24 hours:  Blood pressure 130/81, pulse 60, temperature 98.1 F (36.7 C), temperature source Oral, resp. rate 20, height 5\' 9"  (1.753 m), weight 206 lb 9.6 oz (93.7 kg), SpO2 98 %.     Lymphatics: No cervical, supraclavicular, axillary, or inguinal nodes Resp: Lungs clear bilaterally Cardio: Regular rate and rhythm GI: No hepatosplenomegaly Vascular: No leg edema, wrapping in place at the lower leg bilaterally   Lab Results:  Lab Results  Component Value Date   WBC 5.4 10/09/2022   HGB 14.9 10/09/2022   HCT 43.9 10/09/2022   MCV 86.9 10/09/2022   PLT 225.0 10/09/2022   NEUTROABS 3.2 10/01/2021    CMP  Lab Results  Component Value Date   NA 139 10/09/2022   K 4.2 10/09/2022   CL 101 10/09/2022   CO2 30 10/09/2022   GLUCOSE 99 10/09/2022   BUN 14 10/09/2022   CREATININE 0.85 10/09/2022   CALCIUM 8.9 10/09/2022   PROT 7.1 10/09/2022   ALBUMIN 4.3 10/09/2022   AST 18 10/09/2022   ALT 23 10/09/2022   ALKPHOS 44 10/09/2022   BILITOT 0.5 10/09/2022   GFRNONAA >60 10/01/2021    Lab Results  Component Value Date   CEA1 1.6 10/04/2020   CEA 2.06 04/18/2022    Medications: I have reviewed the patient's current medications.   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  Grade 1.  Proximal resection margin positive.  Focal disruption in the mid segment noted CT abdomen/pelvis 09/28/2020- acute uncomplicated appendicitis CT chest 10/03/2020- no  suspicious pulmonary nodules, no evidence of metastatic disease Colonoscopy 11/27/2020-normal appendiceal orifice, multiple polyps removed from the colon Right colectomy 11/28/2020- no evidence of metastatic disease, no residual goblet cell carcinoma, 23 negative lymph nodes Cycle 1 Xeloda 01/28/2021 Cycle 2 Xeloda 02/18/2021 Cycle 3 Xeloda 03/11/2021 Cycle 4 Xeloda 04/01/2021 Cycle 5 Xeloda 04/22/2021 Cycle 6 Xeloda 05/13/2021 Cycle 7 Xeloda 06/03/2021 Cycle 8 Xeloda 06/24/2021 CTs 10/01/2021-no evidence of recurrent disease CTs 09/30/2022-interval development of a fluid attenuating peritoneal deposit within the lower abdomen, increasing size and number of multiple mesenteric and ileocolic nodes.  Stable right lower lobe pulmonary nodule. PET 10/24/2018 24-0.9 cm level 2 lymph node with mild hypermetabolism, mild hypermetabolism associated with an anterior upper abdomen nodule, right lower quadrant nodule and cystic lesion in the right iliac fossa-new from 10/01/2021, mildly hypermetabolic small left level 2 cervical node-nonspecific Post appendectomy fluid collection- drained by interventional radiology 10/03/2020, negative cytology Colonoscopy 11/27/2020, sessile serrated polyps, tubular adenomas, and hyperplastic polyps Melanoma, left arm November 1989, Clark's level 3, Breslow depth 1.0 Recurrent left upper arm February 1993-excised Recurrence left back, left lower lung (removed in 1995), solitary brain lesion resected in September 1996, status post treatment with the "[place] "regimen, measurable disease in the right lung-chemotherapy ineffective, right lung lesion removed November 1996 Resection of small bowel lesion in February 1997 and a mesenteric lesion in 1998    Disposition: [name] was diagnosed with goblet cell adenocarcinoma of the appendix in March 2022.  He  completed adjuvant Xeloda.  He has a remote history of metastatic melanoma.  I reviewed the PET findings and images with him.   We discussed the differential diagnosis.  The peritoneal nodules are more likely to represent progression of the appendix carcinoma then recurrent melanoma.  We discussed observation versus proceeding with a diagnostic biopsy.  He agrees to consultation with interventional radiology to consider biopsy of a peritoneal nodule.  He would like to schedule the biopsy for 11/14/2022.  He will return for an office visit during the week of 11/17/2022.  We will consider a surgical referral for cytoreductive surgery if the biopsy confirms appendix carcinoma.  We will also need to consider follow-up and biopsy of the left neck lymph node.  Thornton Papas, MD  10/29/2022  2:58 PM

## 2022-10-29 NOTE — Progress Notes (Signed)
Opened in error

## 2022-10-30 NOTE — Progress Notes (Unsigned)
Berdine Dance, MD  Laurena Bering, Leighton Roach, MD Brad, peritoneal nodules are tiny.  No great target.  Rec f/u at 3-4 months with CT right now.  Thanks   [employer]

## 2022-11-04 ENCOUNTER — Telehealth: Payer: Self-pay | Admitting: *Deleted

## 2022-11-04 DIAGNOSIS — C181 Malignant neoplasm of appendix: Secondary | ICD-10-CM

## 2022-11-04 NOTE — Telephone Encounter (Signed)
Informed patient that Dr. Miles Costain said the peritoneal nodules are tiny. Not a great target to biopsy. Suggests repeat CT scan in 3-4 months. Will need to be done at [facility] radiology. CT A/P with contrast will be needed.

## 2022-11-05 ENCOUNTER — Other Ambulatory Visit: Payer: Self-pay | Admitting: *Deleted

## 2022-11-05 NOTE — Progress Notes (Signed)
The proposed treatment discussed in conference is for discussion purpose only and is not a binding recommendation.  The patients have not been physically examined, or presented with their treatment options.  Therefore, final treatment plans cannot be decided.  

## 2022-11-14 ENCOUNTER — Telehealth: Payer: Self-pay

## 2022-11-14 NOTE — Telephone Encounter (Signed)
Spoke with pt regarding his appt on 5/1. Pt agrees with plan of care to defer appt until after upcoming CT per MD Sherrill.

## 2022-11-19 ENCOUNTER — Inpatient Hospital Stay: Payer: 59 | Admitting: Nurse Practitioner

## 2022-12-17 ENCOUNTER — Other Ambulatory Visit: Payer: Self-pay | Admitting: *Deleted

## 2022-12-17 NOTE — Progress Notes (Signed)
Faxed facesheet and order for CT scan to [facility] [phone]. [name] notified and will call to schedule. Reports they will do his blood work there prior to scan.

## 2022-12-26 ENCOUNTER — Encounter: Payer: Self-pay | Admitting: Oncology

## 2022-12-26 ENCOUNTER — Telehealth: Payer: Self-pay | Admitting: *Deleted

## 2022-12-26 NOTE — Telephone Encounter (Signed)
Left VM for patient to call w/date of his CT scan so his f/u with Dr. Truett Perna can get scheduled.

## 2023-01-13 ENCOUNTER — Ambulatory Visit (INDEPENDENT_AMBULATORY_CARE_PROVIDER_SITE_OTHER): Payer: 59 | Admitting: Dermatology

## 2023-01-13 ENCOUNTER — Encounter: Payer: Self-pay | Admitting: Dermatology

## 2023-01-13 VITALS — BP 131/85 | HR 60

## 2023-01-13 DIAGNOSIS — L570 Actinic keratosis: Secondary | ICD-10-CM | POA: Diagnosis not present

## 2023-01-13 DIAGNOSIS — D224 Melanocytic nevi of scalp and neck: Secondary | ICD-10-CM | POA: Diagnosis not present

## 2023-01-13 DIAGNOSIS — D1801 Hemangioma of skin and subcutaneous tissue: Secondary | ICD-10-CM

## 2023-01-13 DIAGNOSIS — Z85820 Personal history of malignant melanoma of skin: Secondary | ICD-10-CM

## 2023-01-13 DIAGNOSIS — L814 Other melanin hyperpigmentation: Secondary | ICD-10-CM | POA: Diagnosis not present

## 2023-01-13 DIAGNOSIS — Z1283 Encounter for screening for malignant neoplasm of skin: Secondary | ICD-10-CM | POA: Diagnosis not present

## 2023-01-13 DIAGNOSIS — W908XXA Exposure to other nonionizing radiation, initial encounter: Secondary | ICD-10-CM | POA: Diagnosis not present

## 2023-01-13 DIAGNOSIS — Z8589 Personal history of malignant neoplasm of other organs and systems: Secondary | ICD-10-CM

## 2023-01-13 DIAGNOSIS — L821 Other seborrheic keratosis: Secondary | ICD-10-CM

## 2023-01-13 DIAGNOSIS — D485 Neoplasm of uncertain behavior of skin: Secondary | ICD-10-CM

## 2023-01-13 DIAGNOSIS — D229 Melanocytic nevi, unspecified: Secondary | ICD-10-CM

## 2023-01-13 DIAGNOSIS — L578 Other skin changes due to chronic exposure to nonionizing radiation: Secondary | ICD-10-CM

## 2023-01-13 NOTE — Progress Notes (Signed)
New Patient Visit   Subjective  Sean [name] is a 62 y.o. male who presents for the following: Skin Cancer Screening and Full Body Skin Exam. Pt had a MM on his LT arm in the late 80's that was metastatic  but no other skin cancer since then. He did interfucen treatment. No family hx of skin cancer. He has had things "frozen off" in recent years.  The patient presents for Total-Body Skin Exam (TBSE) for skin cancer screening and mole check. The patient has spots, moles and lesions to be evaluated, some may be new or changing and the patient has concerns that these could be cancer.    The following portions of the chart were reviewed this encounter and updated as appropriate: medications, allergies, medical history  Review of Systems:  No other skin or systemic complaints except as noted in HPI or Assessment and Plan.  Objective  Well appearing patient in no apparent distress; mood and affect are within normal limits.  A full examination was performed including scalp, head, eyes, ears, nose, lips, neck, chest, axillae, abdomen, back, buttocks, bilateral upper extremities, bilateral lower extremities, hands, feet, fingers, toes, fingernails, and toenails. All findings within normal limits unless otherwise noted below.   Relevant physical exam findings are noted in the Assessment and Plan.  Scalp 3mm pink papule  left and right cheek (2) Erythematous thin papules/macules with gritty scale.       Assessment & Plan   Visit Summary: Sean [name], with a history of metastatic melanoma and goblet cell carcinoma, presented for a routine skin check. He reported no new skin concerns but mentioned a long-standing pink spot on his scalp. Physical examination revealed actinic keratosis on his cheeks and a 3mm pink papule on his scalp, suspected to be either a dermatofibroma or amelanotic melanoma. Cryotherapy was performed on the actinic keratosis spots, and the pink papule was excised for  histopathological examination. The patient was educated on sun protection and scheduled for a follow-up CT scan on July 9th for oncology follow up.  History of Metastatic Melanoms Exam: well heaed scar on Left forearm with no evidence of recurrent, no palpable lymphnodes  Treatment Plan:    - Patient has a history of metastatic melanoma on his left arm in the 80s, which metastasized to his left lung, right lung, brain, and chest. He underwent interleukin-2 interferon treatment at [facility] for immunotherapy research. No recurrence of skin cancer since then.    - Continue annual skin checks and monitor for any suspicious lesions.  LENTIGINES, SEBORRHEIC KERATOSES, CHERRY ANGIOMAS,HEMANGIOMAS, SKIN TAGS- Benign normal skin lesions - Benign-appearing - Call for any changes  MELANOCYTIC NEVI - Tan-brown and/or pink-flesh-colored symmetric macules and papules - Benign appearing on exam today - Observation - Call clinic for new or changing moles - Recommend daily use of broad spectrum spf 30+ sunscreen to sun-exposed areas.   ACTINIC DAMAGE - Chronic condition, secondary to cumulative UV/sun exposure - diffuse scaly erythematous macules with underlying dyspigmentation - Recommend daily broad spectrum sunscreen SPF 30+ to sun-exposed areas, reapply every 2 hours as needed.  Encourage the patient to use mineral sunscreens containing zinc oxide or titanium dioxide, such as Neutrogena Sheer Mineral    - Staying in the shade or wearing long sleeves, sun glasses (UVA+UVB protection) and wide brim hats (4-inch brim around the entire circumference of the hat) are also recommended for sun protection.  - Call for new or changing lesions.   SKIN CANCER SCREENING PERFORMED TODAY.  Neoplasm of uncertain behavior of skin Scalp  Skin / nail biopsy Type of biopsy: tangential   Informed consent: discussed and consent obtained   Timeout: patient name, date of birth, surgical site, and procedure  verified   Procedure prep:  Patient was prepped and draped in usual sterile fashion Prep type:  Isopropyl alcohol Anesthesia: the lesion was anesthetized in a standard fashion   Anesthetic:  1% lidocaine w/ epinephrine 1-100,000 buffered w/ 8.4% NaHCO3 Instrument used: DermaBlade   Hemostasis achieved with: aluminum chloride   Outcome: patient tolerated procedure well   Post-procedure details: sterile dressing applied and wound care instructions given   Dressing type: petrolatum gauze and bandage    AK (actinic keratosis) (2) left and right cheek  Destruction of lesion - left and right cheek Complexity: simple   Destruction method: cryotherapy   Informed consent: discussed and consent obtained   Timeout:  patient name, date of birth, surgical site, and procedure verified Lesion destroyed using liquid nitrogen: Yes   Region frozen until ice ball extended beyond lesion: Yes   Outcome: patient tolerated procedure well with no complications   Post-procedure details: wound care instructions given      Return in about 1 year (around 01/13/2024) for TBSE.  Owens Shark, CMA, am acting as scribe for [employer], DO.   Documentation: I have reviewed the above documentation for accuracy and completeness, and I agree with the above.  Langston Reusing, DO

## 2023-01-13 NOTE — Patient Instructions (Addendum)
Cryotherapy Aftercare  Wash gently with soap and water everyday.   Apply Vaseline and Band-Aid daily until healed.    Patient Handout: Wound Care for Skin Biopsy Site  Patient Handout: Wound Care for Skin Biopsy Site  Taking Care of Your Skin Biopsy Site  Proper care of the biopsy site is essential for promoting healing and minimizing scarring. This handout provides instructions on how to care for your biopsy site to ensure optimal recovery.  1. Cleaning the Wound:  Clean the biopsy site daily with gentle soap and water. Gently pat the area dry with a clean, soft towel. Avoid harsh scrubbing or rubbing the area, as this can irritate the skin and delay healing.  2. Applying Aquaphor and Bandage:  After cleaning the wound, apply a thin layer of Aquaphor ointment to the biopsy site. Cover the area with a sterile bandage to protect it from dirt, bacteria, and friction. Change the bandage daily or as needed if it becomes soiled or wet.  3. Continued Care for One Week:  Repeat the cleaning, Aquaphor application, and bandaging process daily for one week following the biopsy procedure. Keeping the wound clean and moist during this initial healing period will help prevent infection and promote optimal healing.  4. Massaging Aquaphor into the Area:  ---After one week, discontinue the use of bandages but continue to apply Aquaphor to the biopsy site. ----Gently massage the Aquaphor into the area using circular motions. ---Massaging the skin helps to promote circulation and prevent the formation of scar tissue.   Additional Tips:  Avoid exposing the biopsy site to direct sunlight during the healing process, as this can cause hyperpigmentation or worsen scarring. If you experience any signs of infection, such as increased redness, swelling, warmth, or drainage from the wound, contact your healthcare provider immediately. Follow any additional instructions provided by your healthcare  provider for caring for the biopsy site and managing any discomfort. Conclusion:  Taking proper care of your skin biopsy site is crucial for ensuring optimal healing and minimizing scarring. By following these instructions for cleaning, applying Aquaphor, and massaging the area, you can promote a smooth and successful recovery. If you have any questions or concerns about caring for your biopsy site, don't hesitate to contact your healthcare provider for guidance.   Due to recent changes in healthcare laws, you may see results of your pathology and/or laboratory studies on MyChart before the doctors have had a chance to review them. We understand that in some cases there may be results that are confusing or concerning to you. Please understand that not all results are received at the same time and often the doctors may need to interpret multiple results in order to provide you with the best plan of care or course of treatment. Therefore, we ask that you please give us 2 business days to thoroughly review all your results before contacting the office for clarification. Should we see a critical lab result, you will be contacted sooner.   If You Need Anything After Your Visit  If you have any questions or concerns for your doctor, please call our main line at [phone] If no one answers, please leave a voicemail as directed and we will return your call as soon as possible. Messages left after 4 pm will be answered the following business day.   You may also send us a message via MyChart. We typically respond to MyChart messages within 1-2 business days.  For prescription refills, please ask your pharmacy to   contact our office. Our fax number is [phone].  If you have an urgent issue when the clinic is closed that cannot wait until the next business day, you can page your doctor at the number below.    Please note that while we do our best to be available for urgent issues outside of office hours,  we are not available 24/7.   If you have an urgent issue and are unable to reach us, you may choose to seek medical care at your doctor's office, retail clinic, urgent care center, or emergency room.  If you have a medical emergency, please immediately call 911 or go to the emergency department. In the event of inclement weather, please call our main line at [phone] for an update on the status of any delays or closures.  Dermatology Medication Tips: Please keep the boxes that topical medications come in in order to help keep track of the instructions about where and how to use these. Pharmacies typically print the medication instructions only on the boxes and not directly on the medication tubes.   If your medication is too expensive, please contact our office at [phone] or send us a message through MyChart.   We are unable to tell what your co-pay for medications will be in advance as this is different depending on your insurance coverage. However, we may be able to find a substitute medication at lower cost or fill out paperwork to get insurance to cover a needed medication.   If a prior authorization is required to get your medication covered by your insurance company, please allow us 1-2 business days to complete this process.  Drug prices often vary depending on where the prescription is filled and some pharmacies may offer cheaper prices.  The website www.goodrx.com contains coupons for medications through different pharmacies. The prices here do not account for what the cost may be with help from insurance (it may be cheaper with your insurance), but the website can give you the price if you did not use any insurance.  - You can print the associated coupon and take it with your prescription to the pharmacy.  - You may also stop by our office during regular business hours and pick up a GoodRx coupon card.  - If you need your prescription sent electronically to a different  pharmacy, notify our office through [facility] MyChart or by phone at [phone]     

## 2023-01-15 NOTE — Progress Notes (Signed)
Hi Sean [name]  Dr. Onalee Hua reviewed your biopsy results and it showed the spot removed was a little "abnormal" but not cancerous.  No additional treatment is required.  We will continue to monitor the area for re-pigmentation during your annual skin exams. The detailed report is available to view in MyChart.  Have a great day!  Kind Regards,  Dr. Kermit Balo Care Team

## 2023-01-28 ENCOUNTER — Other Ambulatory Visit: Payer: Self-pay | Admitting: *Deleted

## 2023-01-28 ENCOUNTER — Inpatient Hospital Stay
Admission: RE | Admit: 2023-01-28 | Discharge: 2023-01-28 | Disposition: A | Discharge status: Home / Self Care | Payer: Self-pay | Source: Ambulatory Visit | Attending: Oncology | Admitting: Oncology

## 2023-01-28 DIAGNOSIS — C181 Malignant neoplasm of appendix: Secondary | ICD-10-CM

## 2023-01-28 NOTE — Progress Notes (Signed)
Requested WF CT images be uploaded in canopy.

## 2023-01-29 ENCOUNTER — Inpatient Hospital Stay: Payer: 59 | Attending: Oncology | Admitting: Oncology

## 2023-01-29 VITALS — BP 148/73 | HR 62 | Temp 97.9°F | Resp 18 | Wt 204.8 lb

## 2023-01-29 DIAGNOSIS — C181 Malignant neoplasm of appendix: Secondary | ICD-10-CM | POA: Diagnosis present

## 2023-01-29 NOTE — Progress Notes (Signed)
[facility] OFFICE PROGRESS NOTE   Diagnosis: Appendix carcinoma  INTERVAL HISTORY:   [name] turns as scheduled.  He feels well.  Good appetite.  No abdominal pain.  No difficulty with bowel function.  He is working.   Objective:  Vital signs in last 24 hours:  Blood pressure (!) 148/73, pulse 62, temperature 97.9 F (36.6 C), temperature source Temporal, resp. rate 18, weight 204 lb 12.8 oz (92.9 kg), SpO2 99%.    Lymphatics: No cervical, supraclavicular, axillary, or inguinal nodes Resp: Lungs clear bilaterally Cardio: Regular rate and rhythm GI: No hepatosplenomegaly nontender, no mass, no apparent ascites Vascular: No leg edema   Lab Results:  Lab Results  Component Value Date   WBC 5.4 10/09/2022   HGB 14.9 10/09/2022   HCT 43.9 10/09/2022   MCV 86.9 10/09/2022   PLT 225.0 10/09/2022   NEUTROABS 3.2 10/01/2021    CMP  Lab Results  Component Value Date   NA 139 10/09/2022   K 4.2 10/09/2022   CL 101 10/09/2022   CO2 30 10/09/2022   GLUCOSE 99 10/09/2022   BUN 14 10/09/2022   CREATININE 0.85 10/09/2022   CALCIUM 8.9 10/09/2022   PROT 7.1 10/09/2022   ALBUMIN 4.3 10/09/2022   AST 18 10/09/2022   ALT 23 10/09/2022   ALKPHOS 44 10/09/2022   BILITOT 0.5 10/09/2022   GFRNONAA >60 10/01/2021    Lab Results  Component Value Date   CEA1 1.6 10/04/2020   CEA 2.06 04/18/2022   Medications: I have reviewed the patient's current medications.   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  Grade 1.  Proximal resection margin positive.  Focal disruption in the mid segment noted CT abdomen/pelvis 09/28/2020- acute uncomplicated appendicitis CT chest 10/03/2020- no suspicious pulmonary nodules, no evidence of metastatic disease Colonoscopy 11/27/2020-normal appendiceal  orifice, multiple polyps removed from the colon Right colectomy 11/28/2020- no evidence of metastatic disease, no residual goblet cell carcinoma, 23 negative lymph nodes Cycle 1 Xeloda 01/28/2021 Cycle 2 Xeloda 02/18/2021 Cycle 3 Xeloda 03/11/2021 Cycle 4 Xeloda 04/01/2021 Cycle 5 Xeloda 04/22/2021 Cycle 6 Xeloda 05/13/2021 Cycle 7 Xeloda 06/03/2021 Cycle 8 Xeloda 06/24/2021 CTs 10/01/2021-no evidence of recurrent disease CTs 09/30/2022-interval development of a fluid attenuating peritoneal deposit within the lower abdomen, increasing size and number of multiple mesenteric and ileocolic nodes.  Stable right lower lobe pulmonary nodule. PET 10/24/2022 24-0.9 cm level 2 lymph node with mild hypermetabolism, mild hypermetabolism associated with an anterior upper abdomen nodule, right lower quadrant nodule and cystic lesion in the right iliac fossa-new from 10/01/2021, mildly hypermetabolic small left level 2 cervical node-nonspecific Post appendectomy fluid collection- drained by interventional radiology 10/03/2020, negative cytology CT abdomen/pelvis 01/28/2023-interval decrease in some of the right abdomen mesenteric nodes, small implant at the right lower quadrant ileum decreased in size, no new site of metastatic disease Colonoscopy 11/27/2020, sessile serrated polyps, tubular adenomas, and hyperplastic polyps Melanoma, 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  We reviewed the  restaging CT findings.  The images are not available for our review today.  We are  requesting the images be uploaded into the epic system.  There is no clinical evidence of progressive appendix carcinoma.  The CT reveals a decrease in a lesion at the serosa of the right ilium.  No evidence of disease progression on the CT 01/28/2023.  I will present his case at the GI tumor conference for further review of the CT.  Previous review of the images by IR revealed no lesion amenable to biopsy.  He will be scheduled for a repeat CT and office visit in 4-5 months.  He will call for new symptoms.  We will contact him after the GI conference discussion.  Sean Papas, MD  01/29/2023  9:31 AM

## 2023-02-04 ENCOUNTER — Other Ambulatory Visit: Payer: Self-pay

## 2023-02-04 NOTE — Progress Notes (Signed)
 The proposed treatment discussed in conference is for discussion purpose only and is not a binding recommendation.  The patients have not been physically examined, or presented with their treatment options.  Therefore, final treatment plans cannot be decided.  

## 2023-02-05 ENCOUNTER — Telehealth: Payer: Self-pay

## 2023-02-05 ENCOUNTER — Other Ambulatory Visit: Payer: Self-pay

## 2023-02-05 DIAGNOSIS — K3189 Other diseases of stomach and duodenum: Secondary | ICD-10-CM

## 2023-02-05 NOTE — Telephone Encounter (Signed)
-----   Message from [facility] sent at 02/04/2023  5:41 PM EDT ----- Regarding: RE: Presented at GI conference this am RG, I am happy to try, but just want to be upfront that I am not going to be surprised if I cannot access or see anything to sample.  Alexia Freestone, lets look for a procedure date in August for the patient.  Thanks. GM ----- Message ----- From: Lynann Bologna, MD Sent: 02/04/2023   5:37 PM EDT To: Ladene Artist, MD; # Subject: RE: Presented at GI conference this am           Looks like a serosal implant.  From imaging (coronal CT films)it seemed accessible through the duodenal bulb  RG ----- Message ----- From: Lemar Lofty., MD Sent: 02/04/2023   5:14 PM EDT To: Ardell Isaacs, RN; Ladene Artist, MD; # Subject: RE: Presented at GI conference this am         Team, I was in/out of the conference this AM and did not see this patient's imaging. I'm not sure as I'm looking at the CT scan from outside, and I'm not convinced this will be accessible via EUS as it seems to be deeper in the duodenum.  I see someone has placed an arrow near the distal stomach, but I'm not sure if that is gallbladder present or not.   What is the area in the deeper duodenum region and the echoendoscope may not be able to traverse this deeply.   Is this documented serosal implant? I would temper patient about ability to access. I'm also not so sure about timing yet. If I see a LN, am I trying to sample this? Thanks for insights (sorry I wasn't able to see this at conference). GM ----- Message ----- From: Ardell Isaacs, RN Sent: 02/04/2023   2:39 PM EDT To: Lemar Lofty., MD; Lynann Bologna, MD Subject: Presented at GI conference this am             Dr Truett Perna spoke with pt today and would like to proceed with an EUS for biopsy of mass near stomach with your team please.  He is an established pt of Dr Christella Hartigan  Please let me know how I can help.  Thank  you Marylouise Stacks [facility] Drawbridge Nurse Navigator [phone]

## 2023-02-05 NOTE — Telephone Encounter (Signed)
EUS has been scheduled for 03/12/23 at 11 am at [facility] with GM

## 2023-02-05 NOTE — Telephone Encounter (Signed)
Mansouraty, Netty Starring., MD  Ladene Artist, MD; Loretha Stapler, RN BS, The area that they have pointed out on CT with an arrow, may be visible but due to the angulation noted, it may not be clearly accessible from the duodenal bulb due to the scope positioning that could be required.  I'm happy to try since IR does not have any other accessible areas to reach.  Just want to be upfront, this is not the most easy.  It's going to be a few weeks out, but we'll give it a try.  Sean [name], Please schedule this patient for an Upper EUS to evaluate and possibly sample concern for peritoneal implants. Needs to be done before endo of August. Look at 8/22. GM       Previous Messages    ----- Message ----- From: Ladene Artist, MD Sent: 02/04/2023   8:57 PM EDT To: Lemar Lofty., MD Subject: RE: Presented at GI conference this am        There was a lesion pointed out by the radiologist between the liver and stomach, I believe cranial to the gallbladder, I can review the images again with the radiologist there were several apparent peritoneal implants some are smaller compared to a CT a few months ago, he has a history of appendiceal cancer and a remote history of metastatic melanoma, needs a diagnostic biopsy, if you feel the yield is low let me know and I will talk to IR, IR saw nothing they could biopsy in March, unfortunately they are no longer attending the GI conference, thanks ----- Message ----- From: Lemar Lofty., MD Sent: 02/04/2023   5:14 PM EDT To: Ardell Isaacs, RN; Ladene Artist, MD; * Subject: RE: Presented at GI conference this am        Team, I was in/out of the conference this AM and did not see this patient's imaging. I'm not sure as I'm looking at the CT scan from outside, and I'm not convinced this will be accessible via EUS as it seems to be deeper in the duodenum.  I see someone has placed an arrow near the distal stomach, but I'm not sure if that is  gallbladder present or not.   What is the area in the deeper duodenum region and the echoendoscope may not be able to traverse this deeply.   Is this documented serosal implant? I would temper patient about ability to access. I'm also not so sure about timing yet. If I see a LN, am I trying to sample this? Thanks for insights (sorry I wasn't able to see this at conference). GM ----- Message ----- From: Ardell Isaacs, RN Sent: 02/04/2023   2:39 PM EDT To: Lemar Lofty., MD; Lynann Bologna, MD Subject: Presented at GI conference this am            Dr Truett Perna spoke with pt today and would like to proceed with an EUS for biopsy of mass near stomach with your team please.  He is an established pt of Dr Christella Hartigan  Please let me know how I can help.  Thank you Marylouise Stacks [facility] Drawbridge Nurse Navigator [phone]

## 2023-02-06 NOTE — Telephone Encounter (Signed)
EUS scheduled, pt instructed and medications reviewed.  Patient instructions mailed to home.  Patient to call with any questions or concerns.  

## 2023-02-09 ENCOUNTER — Telehealth: Payer: Self-pay | Admitting: *Deleted

## 2023-02-09 NOTE — Telephone Encounter (Signed)
Sean [name] dropped off jury summons requesting Dr. Truett Perna excuse him from going due to upcoming endoscopy procedure currently scheduled for 03/12/23. Summons is for 02/24/23. He is hopeful Dr. Truett Perna can reach out to GI group to get procedure moved up sooner. He has tried to request without success.  Informed him it is doubtful Dr. Truett Perna will have any authority over their schedule, but his message will be relayed to him. Encouraged him to call back and ask to be put on wait list for procedure. Informed him that he is able to fill out back of form himself to just delay his service to later date without MD excuse.

## 2023-02-11 ENCOUNTER — Encounter: Payer: Self-pay | Admitting: *Deleted

## 2023-02-25 ENCOUNTER — Telehealth: Payer: Self-pay | Admitting: *Deleted

## 2023-02-25 ENCOUNTER — Telehealth: Payer: Self-pay | Admitting: Gastroenterology

## 2023-02-25 NOTE — Telephone Encounter (Signed)
Called to request update on insurance authorization for his EUS procedure on 8/22. Informed him that GI office would be the one to get it authorized. Suggested he call [facility] to f/u on this issue.

## 2023-02-25 NOTE — Telephone Encounter (Signed)
Inbound call from patient stating his procedure scheduled for 8/22 need prior authorization for his insurance. Patient requesting a call back. Please advise, thank you.

## 2023-02-26 NOTE — Telephone Encounter (Signed)
Inbound call from patient requesting to speak with pre cert regarding his PA for his colon on August 22nd at [facility]. Patient is upset and is requesting to speak with someone urgently. Please advise.

## 2023-03-03 ENCOUNTER — Encounter (HOSPITAL_COMMUNITY): Payer: Self-pay | Admitting: Gastroenterology

## 2023-03-05 ENCOUNTER — Encounter (INDEPENDENT_AMBULATORY_CARE_PROVIDER_SITE_OTHER): Payer: Self-pay

## 2023-03-11 ENCOUNTER — Other Ambulatory Visit: Payer: Self-pay

## 2023-03-11 DIAGNOSIS — K3189 Other diseases of stomach and duodenum: Secondary | ICD-10-CM

## 2023-03-12 ENCOUNTER — Ambulatory Visit (HOSPITAL_COMMUNITY)
Admission: RE | Admit: 2023-03-12 | Discharge: 2023-03-12 | Disposition: A | Discharge status: Home / Self Care | Payer: 59 | Attending: Gastroenterology | Admitting: Gastroenterology

## 2023-03-12 ENCOUNTER — Ambulatory Visit (HOSPITAL_BASED_OUTPATIENT_CLINIC_OR_DEPARTMENT_OTHER): Payer: 59 | Admitting: Certified Registered"

## 2023-03-12 ENCOUNTER — Other Ambulatory Visit: Payer: Self-pay

## 2023-03-12 ENCOUNTER — Ambulatory Visit (HOSPITAL_COMMUNITY): Payer: 59 | Admitting: Certified Registered"

## 2023-03-12 ENCOUNTER — Encounter (HOSPITAL_COMMUNITY)
Admission: RE | Disposition: A | Discharge status: Home / Self Care | Payer: Self-pay | Source: Home / Self Care | Attending: Gastroenterology

## 2023-03-12 ENCOUNTER — Encounter (HOSPITAL_COMMUNITY): Payer: Self-pay | Admitting: Gastroenterology

## 2023-03-12 DIAGNOSIS — K449 Diaphragmatic hernia without obstruction or gangrene: Secondary | ICD-10-CM | POA: Diagnosis not present

## 2023-03-12 DIAGNOSIS — Z85038 Personal history of other malignant neoplasm of large intestine: Secondary | ICD-10-CM | POA: Diagnosis not present

## 2023-03-12 DIAGNOSIS — Z8589 Personal history of malignant neoplasm of other organs and systems: Secondary | ICD-10-CM | POA: Insufficient documentation

## 2023-03-12 DIAGNOSIS — E785 Hyperlipidemia, unspecified: Secondary | ICD-10-CM

## 2023-03-12 DIAGNOSIS — K3189 Other diseases of stomach and duodenum: Secondary | ICD-10-CM | POA: Diagnosis not present

## 2023-03-12 DIAGNOSIS — Z85841 Personal history of malignant neoplasm of brain: Secondary | ICD-10-CM | POA: Insufficient documentation

## 2023-03-12 DIAGNOSIS — M1990 Unspecified osteoarthritis, unspecified site: Secondary | ICD-10-CM | POA: Diagnosis not present

## 2023-03-12 DIAGNOSIS — K2289 Other specified disease of esophagus: Secondary | ICD-10-CM | POA: Insufficient documentation

## 2023-03-12 DIAGNOSIS — R935 Abnormal findings on diagnostic imaging of other abdominal regions, including retroperitoneum: Secondary | ICD-10-CM | POA: Diagnosis present

## 2023-03-12 DIAGNOSIS — Z85118 Personal history of other malignant neoplasm of bronchus and lung: Secondary | ICD-10-CM | POA: Insufficient documentation

## 2023-03-12 DIAGNOSIS — I899 Noninfective disorder of lymphatic vessels and lymph nodes, unspecified: Secondary | ICD-10-CM | POA: Diagnosis not present

## 2023-03-12 DIAGNOSIS — Z85820 Personal history of malignant melanoma of skin: Secondary | ICD-10-CM | POA: Diagnosis not present

## 2023-03-12 HISTORY — PX: ESOPHAGOGASTRODUODENOSCOPY: SHX5428

## 2023-03-12 HISTORY — PX: BIOPSY: SHX5522

## 2023-03-12 HISTORY — PX: EUS: SHX5427

## 2023-03-12 SURGERY — UPPER ENDOSCOPIC ULTRASOUND (EUS) RADIAL
Anesthesia: Monitor Anesthesia Care

## 2023-03-12 MED ORDER — LIDOCAINE 2% (20 MG/ML) 5 ML SYRINGE
INTRAMUSCULAR | Status: DC | PRN
  Administered 2023-03-12: 100 mg via INTRAVENOUS

## 2023-03-12 MED ORDER — PROPOFOL 10 MG/ML IV BOLUS
INTRAVENOUS | Status: DC | PRN
  Administered 2023-03-12: 40 mg via INTRAVENOUS

## 2023-03-12 MED ORDER — SODIUM CHLORIDE 0.9 % IV SOLN: INTRAVENOUS | Status: DC

## 2023-03-12 MED ORDER — PROPOFOL 1000 MG/100ML IV EMUL
INTRAVENOUS | Status: AC
  Filled 2023-03-12: qty 100

## 2023-03-12 MED ORDER — LACTATED RINGERS IV SOLN
INTRAVENOUS | Status: AC | PRN
  Administered 2023-03-12: 1000 mL via INTRAVENOUS

## 2023-03-12 MED ORDER — PROPOFOL 500 MG/50ML IV EMUL
INTRAVENOUS | Status: DC | PRN
  Administered 2023-03-12: 150 ug/kg/min via INTRAVENOUS

## 2023-03-12 NOTE — H&P (Signed)
GASTROENTEROLOGY PROCEDURE H&P NOTE   Primary Care Physician: Jeoffrey Massed, MD  HPI: Sean [name] is a 62 y.o. male who presents for EGD/EUS to evaluate an abnormal imaging finding with potential serosal implant versus lymph node near the duodenum in setting of previous appendiceal cancer.  Past Medical History:  Diagnosis Date   Arthritis    lower back   Asymmetrical sensorineural hearing loss    L>>R: ENT eval 04/2018.  Pt declined MRI brain at that time.   Cancer of appendix (HCC)    Colon adenocarcinoma (HCC) 1995   goblet cell adenocarcinoma of colon, dx'd when he got acute appendicitis w/perf-->path showed adenocarcinoma with positive margins.  CT chest neg for metastatic dz, CEA neg. R hemicolectomy with iliocolonic anastomosis 11/2020.   Coronary artery calcification    and aortic atherosclerosis.  Noted on 09/2021 CT that was done for cancer surveillance   Hepatic steatosis    noted on CT 09/2021 for cancer surveillance   History of adenomatous polyp of colon 07/2016   History of vitamin D deficiency    still present as of 09/2017.  High dose vit D started 10/16/17.  Increased dose to 50K twice per week 02/18/18.   Hyperlipidemia    Mild, never meds.  Patient prefers no meds for this.   Metastatic malignant melanoma (HCC) 1989   Recurrence L upper arm 1993.  Then left back and LUL lung recurrence 1995-resected.  Brain lesion recurrence 1996-resected.  RLL lung lesion 1996--chemo & then resection.  Small bowel recurrence resected 1997, then mesenteric lesion resected 1998.  No sign of recurrence since 1998.  Released by onc/Dr. Magrinot as of 12/2015 (needs annual dermatologist exam and annual ophth exam).   Polyposis of colon    Prediabetes    A1c 5.9% 12/2014.  6.2% 09/2017. 6.3% 04/2019.   Past Surgical History:  Procedure Laterality Date   APPENDECTOMY     BRAIN SURGERY     Removal of melanoma met   COLON SURGERY  11/2020   Proximal hemicolectomy and lysis of  adhesions and repair of incarcerated incisional hernia.  For colon ca resection.  iliocolonic anastomosis.   COLONOSCOPY  2009; 07/2016   11/27/20->adenoma   COLONOSCOPY WITH PROPOFOL N/A 08/18/2016   Tubular adenoma.  Recall 3 yrs.  Procedure: COLONOSCOPY WITH PROPOFOL;  Surgeon: Charolett Bumpers, MD;  Location: [facility];  Service: Endoscopy;  Laterality: N/A;   IR SINUS/FIST TUBE CHK-NON GI  10/08/2020   LAPAROSCOPIC APPENDECTOMY N/A 09/28/2020   Procedure: APPENDECTOMY LAPAROSCOPIC;  Surgeon: Harriette Bouillon, MD;  Location: [facility];  Service: General;  Laterality: N/A;   LUNG SURGERY     "              "          "        "   SMALL INTESTINE SURGERY  1998   x2 from melanoma   No current facility-administered medications for this encounter.   No current facility-administered medications for this encounter. Allergies  Allergen Reactions   Aspirin Nausea And Vomiting   Penicillins Other (See Comments)    Childhood  Has patient had a PCN reaction causing immediate rash, facial/tongue/throat swelling, SOB or lightheadedness with hypotension: unknown Has patient had a PCN reaction causing severe rash involving mucus membranes or skin necrosis: {unkjnown Has patient had a PCN reaction that required hospitalization {no Has patient had a PCN reaction occurring within the last 10 years: no If all  of the above answers are "NO", then may proceed with Cephalosporin use.   Family History  Problem Relation Age of Onset   Breast cancer Mother 74       metastatic   Alcohol abuse Father    Thyroid cancer Sister 10   Colon cancer Neg Hx    Esophageal cancer Neg Hx    Stomach cancer Neg Hx    Rectal cancer Neg Hx    Social History   Socioeconomic History   Marital status: Divorced    Spouse name: Not on file   Number of children: 2   Years of education: Not on file   Highest education level: Not on file  Occupational History    Employer: [employer]    Comment: Driver  Tobacco Use    Smoking status: Never   Smokeless tobacco: Former    Quit date: 2021  Vaping Use   Vaping status: Never Used  Substance and Sexual Activity   Alcohol use: Not Currently    Comment: 1 drink/ 2 weeks   Drug use: No   Sexual activity: Yes  Other Topics Concern   Not on file  Social History Narrative   Married, 2 sons.   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 Drives a lot.   Orig from [place].   No T/A/Ds.   Diet ok except fast foods.   Social Determinants of Health   Financial Resource Strain: Not on file  Food Insecurity: Not on file  Transportation Needs: Not on file  Physical Activity: Sufficiently Active (04/09/2022)   Exercise Vital Sign    Days of Exercise per Week: 7 days    Minutes of Exercise per Session: 150+ min  Stress: Not on file  Social Connections: Unknown (04/09/2022)   Social Connection and Isolation Panel [NHANES]    Frequency of Communication with Friends and Family: Not on file    Frequency of Social Gatherings with Friends and Family: Not on file    Attends Religious Services: Not on file    Active Member of Clubs or Organizations: Not on file    Attends Banker Meetings: Not on file    Marital Status: Divorced  Intimate Partner Violence: Unknown (10/21/2021)   Received from [facility]   HITS    Physically Hurt: Not on file    Insult or Talk Down To: Not on file    Threaten Physical Harm: Not on file    Scream or Curse: Not on file    Physical Exam: Today's Vitals   03/04/23 1050  Weight: 88.5 kg   Body mass index is 28.8 kg/m. GEN: NAD EYE: Sclerae anicteric ENT: MMM CV: Non-tachycardic GI: Soft, NT/ND NEURO:  Alert & Oriented x 3  Lab Results: No results for input(s): "WBC", "HGB", "HCT", "PLT" in the last 72 hours. BMET No results for input(s): "NA", "K", "CL", "CO2", "GLUCOSE", "BUN", "CREATININE", "CALCIUM" in the last 72 hours. LFT No results for input(s): "PROT", "ALBUMIN", "AST", "ALT", "ALKPHOS", "BILITOT", "BILIDIR", "IBILI" in the  last 72 hours. PT/INR No results for input(s): "LABPROT", "INR" in the last 72 hours.   Impression / Plan: This is a 62 y.o.male who presents for EGD/EUS to evaluate an abnormal imaging finding with potential serosal implant versus lymph node near the duodenum in setting of previous appendiceal cancer.  The risks of an EUS including intestinal perforation, bleeding, infection, aspiration, and medication effects were discussed as was the possibility it may not give a definitive diagnosis if a biopsy is performed.  When a biopsy of the pancreas is done as part of the EUS, there is an additional risk of pancreatitis at the rate of about 1-2%.  It was explained that procedure related pancreatitis is typically mild, although it can be severe and even life threatening, which is why we do not perform random pancreatic biopsies and only biopsy a lesion/area we feel is concerning enough to warrant the risk.   The risks and benefits of endoscopic evaluation/treatment were discussed with the patient and/or family; these include but are not limited to the risk of perforation, infection, bleeding, missed lesions, lack of diagnosis, severe illness requiring hospitalization, as well as anesthesia and sedation related illnesses.  The patient's history has been reviewed, patient examined, no change in status, and deemed stable for procedure.  The patient and/or family is agreeable to proceed.    Corliss Parish, MD [facility] Office # [phone]

## 2023-03-12 NOTE — Discharge Instructions (Signed)
 YOU HAD AN ENDOSCOPIC PROCEDURE TODAY: Refer to the procedure report and other information in the discharge instructions given to you for any specific questions about what was found during the examination. If this information does not answer your questions, please call [place] office at [phone] to clarify.   YOU SHOULD EXPECT: Some feelings of bloating in the abdomen. Passage of more gas than usual. Walking can help get rid of the air that was put into your GI tract during the procedure and reduce the bloating. If you had a lower endoscopy (such as a colonoscopy or flexible sigmoidoscopy) you may notice spotting of blood in your stool or on the toilet paper. Some abdominal soreness may be present for a day or two, also.  DIET: Your first meal following the procedure should be a light meal and then it is ok to progress to your normal diet. A half-sandwich or bowl of soup is an example of a good first meal. Heavy or fried foods are harder to digest and may make you feel nauseous or bloated. Drink plenty of fluids but you should avoid alcoholic beverages for 24 hours. If you had a esophageal dilation, please see attached instructions for diet.    ACTIVITY: Your care partner should take you home directly after the procedure. You should plan to take it easy, moving slowly for the rest of the day. You can resume normal activity the day after the procedure however YOU SHOULD NOT DRIVE, use power tools, machinery or perform tasks that involve climbing or major physical exertion for 24 hours (because of the sedation medicines used during the test).   SYMPTOMS TO REPORT IMMEDIATELY: A gastroenterologist can be reached at any hour. Please call [phone]  for any of the following symptoms:  Following upper endoscopy (EGD, EUS) Vomiting of blood or coffee ground material  New, significant abdominal pain  New, significant chest pain or pain under the shoulder blades  Painful or persistently difficult  swallowing  New shortness of breath  Black, tarry-looking or red, bloody stools  FOLLOW UP:  If any biopsies were taken you will be contacted by phone or by letter within the next 1-3 weeks. Call [phone]  if you have not heard about the biopsies in 3 weeks.  Please also call with any specific questions about appointments or follow up tests.

## 2023-03-12 NOTE — Transfer of Care (Signed)
Immediate Anesthesia Transfer of Care Note  Patient: Sean [name]  Procedure(s) Performed: UPPER ENDOSCOPIC ULTRASOUND (EUS) RADIAL ESOPHAGOGASTRODUODENOSCOPY (EGD) BIOPSY  Patient Location: PACU and Endoscopy Unit  Anesthesia Type:MAC  Level of Consciousness: drowsy and responds to stimulation  Airway & Oxygen Therapy: Patient Spontanous Breathing and Patient connected to face mask oxygen  Post-op Assessment: Report given to RN and Post -op Vital signs reviewed and stable  Post vital signs: Reviewed and stable  Last Vitals:  Vitals Value Taken Time  BP 111/58 03/12/23 1323  Temp    Pulse 54 03/12/23 1324  Resp 19 03/12/23 1324  SpO2 99 % 03/12/23 1324  Vitals shown include unfiled device data.  Last Pain:  Vitals:   03/12/23 1001  TempSrc: Temporal  PainSc: 0-No pain         Complications: No notable events documented.

## 2023-03-12 NOTE — Op Note (Signed)
[facility] Patient Name: Sean [name] Procedure Date: 03/12/2023 MRN: [id number] Attending MD: Corliss Parish , MD, [id number] Date of Birth: 1961-01-11 CSN: [id number] Age: 62 Admit Type: Outpatient Procedure:                Upper EUS Indications:              Abnormal abdominal PET scan Providers:                Corliss Parish, MD, Norman Clay, RN, Leanne Lovely, Technician Referring MD:             Leighton Roach. Vanetta Shawl MD, MD Medicines:                Monitored Anesthesia Care Complications:            No immediate complications. Estimated Blood Loss:     Estimated blood loss was minimal. Procedure:                Pre-Anesthesia Assessment:                           - Prior to the procedure, a History and Physical                            was performed, and patient medications and                            allergies were reviewed. The patient's tolerance of                            previous anesthesia was also reviewed. The risks                            and benefits of the procedure and the sedation                            options and risks were discussed with the patient.                            All questions were answered, and informed consent                            was obtained. Prior Anticoagulants: The patient has                            taken no anticoagulant or antiplatelet agents. ASA                            Grade Assessment: II - A patient with mild systemic                            disease. After reviewing the risks and benefits,  the patient was deemed in satisfactory condition to                            undergo the procedure.                           After obtaining informed consent, the endoscope was                            passed under direct vision. Throughout the                            procedure, the patient's blood pressure, pulse, and                             oxygen saturations were monitored continuously. The                            [id number]-[id number] ([id number]) Olympus endoscope was introduced                            through the mouth, and advanced to the second part                            of duodenum. The [id number] ([id number]) Olympus linear                            ultrasound scope was introduced through the mouth,                            and advanced to the duodenum for ultrasound                            examination from the stomach and duodenum. The                            upper EUS was accomplished without difficulty. The                            patient tolerated the procedure. Scope In: Scope Out: Findings:      ENDOSCOPIC FINDING: :      No gross lesions were noted in the entire esophagus.      The Z-line was irregular and was found 38 cm from the incisors.      A 2 cm hiatal hernia was present.      Patchy mildly erythematous mucosa without bleeding was found in the       entire examined stomach. Biopsies were taken with a cold forceps for       histology and Helicobacter pylori testing.      No gross lesions were noted in the duodenal bulb, in the first portion       of the duodenum and in the second portion of the duodenum.      The major papilla was normal.      ENDOSONOGRAPHIC FINDING: :      There  was no sign of significant endosonographic abnormality in the       pancreatic head (PD = 1.8 mm), genu of the pancreas (PD = 1.9 mm),       pancreatic body (PD = 0.7 mm) and pancreatic tail (PD = 0.8 mm). No       masses, no cysts, the pancreatic duct was regular in contour.      There was no sign of significant endosonographic abnormality in the       common bile duct (3.2 mm) and in the common hepatic duct (4.9 mm). An       unremarkable gallbladder was identified.      Endosonographic imaging of the ampulla showed no intramural       (subepithelial) lesion.      Endosonographic imaging in the  antrum of the stomach, in the prepyloric       region of the stomach and in the pylorus showed no extrinsic       compression, intramural (subepithelial) lesion, mass or wall thickening.      Endosonographic imaging in the duodenal bulb and in the second portion       of the duodenum showed no extrinsic compression, intramural       (subepithelial) lesion or wall thickening.      Endosonographic imaging in the visualized portion of the liver showed no       mass.      No malignant-appearing lymph nodes were visualized in the celiac region       (level 20) and porta hepatis region.      The celiac region was visualized. Impression:               EGD impression:                           - No gross lesions in the entire esophagus. Z-line                            irregular, 38 cm from the incisors.                           - 2 cm hiatal hernia.                           - Erythematous mucosa in the stomach. Biopsied.                           - No gross lesions in the duodenal bulb, in the                            first portion of the duodenum and in the second                            portion of the duodenum.                           - Normal major papilla.                           EUS impression:                           -  There was no sign of significant pathology in the                            pancreatic head, genu of the pancreas, pancreatic                            body and pancreatic tail.                           - There was no sign of significant pathology in the                            common bile duct and in the common hepatic duct.                           - No malignant-appearing lymph nodes were                            visualized in the celiac region (level 20) and                            porta hepatis region.                           - After extensive evaluation, I could not visualize                            any significant abnormality in the  regions of the                            distal stomach and proximal duodenum or region near                            the gallbladder that showed overt evidence of being                            masslike or serosal implant likeness. Moderate Sedation:      Not Applicable - Patient had care per Anesthesia. Recommendation:           - The patient will be observed post-procedure,                            until all discharge criteria are met.                           - Discharge patient to home.                           - Patient has a contact number available for                            emergencies. The signs and symptoms of potential  delayed complications were discussed with the                            patient. Return to normal activities tomorrow.                            Written discharge instructions were provided to the                            patient.                           - High fiber diet.                           - Observe patient's clinical course.                           - Await path results.                           - Patient will need to follow-up with oncology for                            further discussions +/- imaging to consider if                            there is another route of sampling this region if                            it is still abnormal, interventional radiology                            versus surgical laparoscopy.                           - The findings and recommendations were discussed                            with the patient.                           - The findings and recommendations were discussed                            with the patient's family. Procedure Code(s):        --- Professional ---                           [phone], Esophagogastroduodenoscopy, flexible,                            transoral; with endoscopic ultrasound examination                            limited to the  esophagus, stomach or duodenum, and  adjacent structures                           43239, Esophagogastroduodenoscopy, flexible,                            transoral; with biopsy, single or multiple Diagnosis Code(s):        --- Professional ---                           K22.89, Other specified disease of esophagus                           K44.9, Diaphragmatic hernia without obstruction or                            gangrene                           K31.89, Other diseases of stomach and duodenum                           I89.9, Noninfective disorder of lymphatic vessels                            and lymph nodes, unspecified                           R93.5, Abnormal findings on diagnostic imaging of                            other abdominal regions, including retroperitoneum CPT copyright 2022 American Medical Association. All rights reserved. The codes documented in this report are preliminary and upon coder review may  be revised to meet current compliance requirements. Corliss Parish, MD 03/12/2023 1:38:07 PM Number of Addenda: 0

## 2023-03-12 NOTE — Anesthesia Preprocedure Evaluation (Addendum)
Anesthesia Evaluation  Patient identified by MRN, date of birth, ID band Patient awake    Reviewed: Allergy & Precautions, NPO status , Patient's Chart, lab work & pertinent test results  History of Anesthesia Complications Negative for: history of anesthetic complications  Airway Mallampati: III  TM Distance: >3 FB Neck ROM: Full    Dental  (+) Dental Advisory Given   Pulmonary  H/o lung met from melanoma   breath sounds clear to auscultation       Cardiovascular negative cardio ROS  Rhythm:Regular Rate:Normal     Neuro/Psych H/o brain met from melanoma    GI/Hepatic Neg liver ROS,,,H/o colon cancer   Endo/Other  BMI 30  Renal/GU negative Renal ROS     Musculoskeletal  (+) Arthritis , Osteoarthritis,    Abdominal   Peds  Hematology negative hematology ROS (+)   Anesthesia Other Findings H/o malig melanoma  Reproductive/Obstetrics                             Anesthesia Physical Anesthesia Plan  ASA: 3  Anesthesia Plan: MAC   Post-op Pain Management: Minimal or no pain anticipated   Induction:   PONV Risk Score and Plan: 1 and Treatment may vary due to age or medical condition  Airway Management Planned: Natural Airway and Nasal Cannula  Additional Equipment: None  Intra-op Plan:   Post-operative Plan:   Informed Consent: I have reviewed the patients History and Physical, chart, labs and discussed the procedure including the risks, benefits and alternatives for the proposed anesthesia with the patient or authorized representative who has indicated his/her understanding and acceptance.     Dental advisory given  Plan Discussed with: CRNA and Surgeon  Anesthesia Plan Comments:         Anesthesia Quick Evaluation

## 2023-03-12 NOTE — Anesthesia Postprocedure Evaluation (Signed)
Anesthesia Post Note  Patient: Sean [name]  Procedure(s) Performed: UPPER ENDOSCOPIC ULTRASOUND (EUS) RADIAL ESOPHAGOGASTRODUODENOSCOPY (EGD) BIOPSY     Patient location during evaluation: Endoscopy Anesthesia Type: MAC Level of consciousness: awake and alert, patient cooperative and oriented Pain management: pain level controlled Vital Signs Assessment: post-procedure vital signs reviewed and stable Respiratory status: spontaneous breathing, nonlabored ventilation and respiratory function stable Cardiovascular status: blood pressure returned to baseline and stable Postop Assessment: no apparent nausea or vomiting Anesthetic complications: no   No notable events documented.  Last Vitals:  Vitals:   03/12/23 1333 03/12/23 1338  BP: 102/63 111/73  Pulse: (!) 59 (!) 58  Resp: (!) 21 20  Temp:    SpO2: 96% 98%    Last Pain:  Vitals:   03/12/23 1338  TempSrc:   PainSc: 0-No pain                 Nakiya Rallis,E. Tra Wilemon

## 2023-03-12 NOTE — Anesthesia Procedure Notes (Signed)
Procedure Name: MAC Date/Time: 03/12/2023 12:36 PM  Performed by: Sindy Guadeloupe, CRNAPre-anesthesia Checklist: Patient identified, Emergency Drugs available, Suction available, Patient being monitored and Timeout performed Oxygen Delivery Method: Simple face mask Placement Confirmation: positive ETCO2

## 2023-03-13 LAB — SURGICAL PATHOLOGY

## 2023-03-14 ENCOUNTER — Encounter: Payer: Self-pay | Admitting: Gastroenterology

## 2023-03-15 ENCOUNTER — Encounter (HOSPITAL_COMMUNITY): Payer: Self-pay | Admitting: Gastroenterology

## 2023-03-18 ENCOUNTER — Telehealth: Payer: Self-pay | Admitting: *Deleted

## 2023-03-18 NOTE — Telephone Encounter (Signed)
Mr. Angelo called to request Dr. Truett Perna call him to discuss the negative EUS procedure and next steps to determine what is the lesion in his GI tract.  [phone]

## 2023-03-26 ENCOUNTER — Telehealth: Payer: Self-pay | Admitting: *Deleted

## 2023-03-26 DIAGNOSIS — C181 Malignant neoplasm of appendix: Secondary | ICD-10-CM

## 2023-03-26 NOTE — Telephone Encounter (Signed)
Mr. Mcglocklin left VM requesting to speak w/Dr. Truett Perna. Has an appointment on 03/30/23 at [facility]

## 2023-03-26 NOTE — Addendum Note (Signed)
Addended by: Wandalee Ferdinand on: 03/26/2023 01:44 PM   Modules accepted: Orders

## 2023-03-26 NOTE — Telephone Encounter (Signed)
Per Dr. Truett Perna: Dr. Donell Beers and he spoke and she said the nodule is not accessible by laparoscopy. Recommends referral to [facility] to Dr. Herbie Saxon. Only way to obtain diagnosis is with surgical biopsy. Mr. Hauptman agrees to the referral.

## 2023-03-30 ENCOUNTER — Encounter: Payer: Self-pay | Admitting: *Deleted

## 2023-03-30 ENCOUNTER — Inpatient Hospital Stay: Payer: 59 | Admitting: Oncology

## 2023-03-30 NOTE — Progress Notes (Signed)
Called [phone] to inquire about his "urgent" referral sent on 03/26/23. Was informed that medical records are behind schedule in uploading records for MD to review. Was instructed to call back on 04/01/23 for status.

## 2023-04-10 ENCOUNTER — Ambulatory Visit (INDEPENDENT_AMBULATORY_CARE_PROVIDER_SITE_OTHER): Payer: 59 | Admitting: Family Medicine

## 2023-04-10 ENCOUNTER — Encounter: Payer: Self-pay | Admitting: Family Medicine

## 2023-04-10 VITALS — BP 118/71 | HR 53 | Temp 98.1°F | Ht 68.0 in | Wt 202.8 lb

## 2023-04-10 DIAGNOSIS — E7800 Pure hypercholesterolemia, unspecified: Secondary | ICD-10-CM | POA: Diagnosis not present

## 2023-04-10 DIAGNOSIS — R7303 Prediabetes: Secondary | ICD-10-CM | POA: Diagnosis not present

## 2023-04-10 DIAGNOSIS — G5603 Carpal tunnel syndrome, bilateral upper limbs: Secondary | ICD-10-CM | POA: Diagnosis not present

## 2023-04-10 DIAGNOSIS — R200 Anesthesia of skin: Secondary | ICD-10-CM

## 2023-04-10 LAB — BASIC METABOLIC PANEL
BUN: 14 mg/dL (ref 6–23)
CO2: 29 mEq/L (ref 19–32)
Calcium: 9.1 mg/dL (ref 8.4–10.5)
Chloride: 103 mEq/L (ref 96–112)
Creatinine, Ser: 0.81 mg/dL (ref 0.40–1.50)
GFR: 95.1 mL/min (ref >60.00)
Glucose, Bld: 95 mg/dL (ref 70–99)
Potassium: 4.3 mEq/L (ref 3.5–5.1)
Sodium: 139 mEq/L (ref 135–145)

## 2023-04-10 LAB — LIPID PANEL
Cholesterol: 176 mg/dL (ref 0–200)
HDL: 43.5 mg/dL (ref >39.00)
LDL Cholesterol: 104 mg/dL — ABNORMAL HIGH (ref 0–99)
NonHDL: 132.27
Total CHOL/HDL Ratio: 4
Triglycerides: 143 mg/dL (ref 0.0–149.0)
VLDL: 28.6 mg/dL (ref 0.0–40.0)

## 2023-04-10 LAB — POCT GLYCOSYLATED HEMOGLOBIN (HGB A1C)
HbA1c POC (<> result, manual entry): 5.7 % (ref 4.0–5.6)
HbA1c, POC (controlled diabetic range): 5.7 % (ref 0.0–7.0)
HbA1c, POC (prediabetic range): 5.7 % (ref 5.7–6.4)
Hemoglobin A1C: 5.7 % — AB (ref 4.0–5.6)

## 2023-04-10 NOTE — Progress Notes (Signed)
OFFICE VISIT  04/10/2023  CC:  Chief Complaint  Patient presents with   Medical Management of Chronic Issues    Patient is a 62 y.o. male who presents for 8-month follow-up hyperlipidemia and prediabetes.  INTERIM HX: Sean [name] is doing pretty well other than being stressed about lack of diagnosis for his abdominal nodule seen on PET scan. He has been referred to [facility] to Dr. Herbie Saxon to pursue surgical biopsy for his abdominal nodule that is near the duodenum nodule.  The endoscopic ultrasound he had on 03/12/2023 did not provide adequate information or access for biopsy.  He describes eating an excellent low-carb diet.  He is active.  He has had a few years of gradually increasing difficulty with numbness in his fingers.  It comes and goes.  Shaking the fingers or simply moving fingers and wrist around seems to alleviate it somewhat. He has not paid particular attention to the distribution in his fingers.  He has a little neck stiffness at times, particularly at the end of the day.  However, nothing severe or persistent.  No radiating pain down the arms.  Past Medical History:  Diagnosis Date   Arthritis    lower back   Asymmetrical sensorineural hearing loss    L>>R: ENT eval 04/2018.  Pt declined MRI brain at that time.   Cancer of appendix (HCC)    Colon adenocarcinoma (HCC) 1995   goblet cell adenocarcinoma of colon, dx'd when he got acute appendicitis w/perf-->path showed adenocarcinoma with positive margins.  CT chest neg for metastatic dz, CEA neg. R hemicolectomy with iliocolonic anastomosis 11/2020.   Coronary artery calcification    and aortic atherosclerosis.  Noted on 09/2021 CT that was done for cancer surveillance   Hepatic steatosis    noted on CT 09/2021 for cancer surveillance   History of adenomatous polyp of colon 07/2016   History of vitamin D deficiency    still present as of 09/2017.  High dose vit D started 10/16/17.  Increased dose to 50K twice  per week 02/18/18.   Hyperlipidemia    Mild, never meds.  Patient prefers no meds for this.   Metastatic malignant melanoma (HCC) 1989   Recurrence L upper arm 1993.  Then left back and LUL lung recurrence 1995-resected.  Brain lesion recurrence 1996-resected.  RLL lung lesion 1996--chemo & then resection.  Small bowel recurrence resected 1997, then mesenteric lesion resected 1998.  No sign of recurrence since 1998.  Released by onc/Dr. Magrinot as of 12/2015 (needs annual dermatologist exam and annual ophth exam).   Polyposis of colon    Prediabetes    A1c 5.9% 12/2014.  6.2% 09/2017. 6.3% 04/2019.    Past Surgical History:  Procedure Laterality Date   APPENDECTOMY     BIOPSY  03/12/2023   Procedure: BIOPSY;  Surgeon: Mansouraty, Netty Starring., MD;  Location: [facility];  Service: Gastroenterology;;   BRAIN SURGERY     Removal of melanoma met   COLON SURGERY  11/2020   Proximal hemicolectomy and lysis of adhesions and repair of incarcerated incisional hernia.  For colon ca resection.  iliocolonic anastomosis.   COLONOSCOPY  2009; 07/2016   11/27/20->adenoma   COLONOSCOPY WITH PROPOFOL N/A 08/18/2016   Tubular adenoma.  Recall 3 yrs.  Procedure: COLONOSCOPY WITH PROPOFOL;  Surgeon: Charolett Bumpers, MD;  Location: [facility];  Service: Endoscopy;  Laterality: N/A;   ESOPHAGOGASTRODUODENOSCOPY N/A 03/12/2023   Procedure: ESOPHAGOGASTRODUODENOSCOPY (EGD);  Surgeon: Lemar Lofty., MD;  Location:  [facility];  Service: Gastroenterology;  Laterality: N/A;   EUS N/A 03/12/2023   Procedure: UPPER ENDOSCOPIC ULTRASOUND (EUS) RADIAL;  Surgeon: Lemar Lofty., MD;  Location: [facility];  Service: Gastroenterology;  Laterality: N/A;   IR SINUS/FIST TUBE CHK-NON GI  10/08/2020   LAPAROSCOPIC APPENDECTOMY N/A 09/28/2020   Procedure: APPENDECTOMY LAPAROSCOPIC;  Surgeon: Harriette Bouillon, MD;  Location: [facility];  Service: General;  Laterality: N/A;   LUNG SURGERY     "              "           "        "   SMALL INTESTINE SURGERY  1998   x2 from melanoma    Outpatient Medications Prior to Visit  Medication Sig Dispense Refill   Cholecalciferol (VITAMIN D3) 50 MCG (2000 UT) TABS Take 2,000 Units by mouth in the morning.     Omega-3 Fatty Acids (FISH OIL) 1000 MG CAPS Take by mouth. Taking 1200 mg     No facility-administered medications prior to visit.    Allergies  Allergen Reactions   Aspirin Nausea And Vomiting   Penicillins Other (See Comments)    Childhood  Has patient had a PCN reaction causing immediate rash, facial/tongue/throat swelling, SOB or lightheadedness with hypotension: unknown Has patient had a PCN reaction causing severe rash involving mucus membranes or skin necrosis: {unkjnown Has patient had a PCN reaction that required hospitalization {no Has patient had a PCN reaction occurring within the last 10 years: no If all of the above answers are "NO", then may proceed with Cephalosporin use.    Review of Systems As per HPI  PE:    04/10/2023    9:22 AM 03/12/2023    1:43 PM 03/12/2023    1:38 PM  Vitals with BMI  Height 5\' 8"     Weight 202 lbs 13 oz    BMI 30.84    Systolic 118 105 284  Diastolic 71 77 73  Pulse 53 59 58     Physical Exam  Gen: Alert, well appearing.  Patient is oriented to person, place, time, and situation. AFFECT: pleasant, lucid thought and speech. Wrists and fingers without swelling.  No tenderness to palpation. Neurovascularly intact.  Tinel's neg on R, pos on L. Phalen's equivocal bilat. No thenar atrophy.  LABS:  Last CBC Lab Results  Component Value Date   WBC 5.4 10/09/2022   HGB 14.9 10/09/2022   HCT 43.9 10/09/2022   MCV 86.9 10/09/2022   MCH 30.4 10/01/2021   RDW 12.9 10/09/2022   PLT 225.0 10/09/2022   Last metabolic panel Lab Results  Component Value Date   GLUCOSE 99 10/09/2022   NA 139 10/09/2022   K 4.2 10/09/2022   CL 101 10/09/2022   CO2 30 10/09/2022   BUN 14 10/09/2022    CREATININE 0.85 10/09/2022   GFR 94.05 10/09/2022   CALCIUM 8.9 10/09/2022   PHOS 4.2 12/01/2020   PROT 7.1 10/09/2022   ALBUMIN 4.3 10/09/2022   BILITOT 0.5 10/09/2022   ALKPHOS 44 10/09/2022   AST 18 10/09/2022   ALT 23 10/09/2022   ANIONGAP 7 10/01/2021   Last lipids Lab Results  Component Value Date   CHOL 217 (H) 10/09/2022   HDL 50.00 10/09/2022   LDLCALC 136 (H) 10/09/2022   LDLDIRECT 121.0 10/03/2021   TRIG 158.0 (H) 10/09/2022   CHOLHDL 4 10/09/2022   Last hemoglobin A1c Lab Results  Component Value Date  HGBA1C 5.7 (A) 04/10/2023   HGBA1C 5.7 04/10/2023   HGBA1C 5.7 04/10/2023   HGBA1C 5.7 04/10/2023   Last thyroid functions Lab Results  Component Value Date   TSH 3.00 10/09/2022   Last vitamin D Lab Results  Component Value Date   VD25OH 28.04 (L) 04/10/2022   IMPRESSION AND PLAN:  #1 prediabetes, doing great with diet. His hemoglobin A1c has improved to 5.7%.  #2 hypercholesterolemia. Discussed indication for statin again today.  He continues to be resistant/hesitant to statin treatment due to potential side effects. He will continue to consider.  We will monitor lipids today.  #3 intra-abdominal nodule on CT in March this year (hx of cancer of appendix as well as melanoma).  Follow-up PET scan in April showed hypermetabolic activity in these regions. Last month the GI MD was unable to gain adequate visualization via upper endoscopic ultrasound so a biopsy was not attempted. He has appointment with a surgical oncologist (Dr. Herbie Saxon) at [facility] for consideration of surgical exploration and biopsy.  #4 bilateral hand numbness. Suspect carpal tunnel syndrome. Bedside MSK ultrasound today showed median nerve area 0.12 cm on the left and 0.1 cm on the right. Wrist splints recommended.  He will get these over-the-counter. If no improvement with adequate use of these in 6 weeks will likely proceed with diagnostic/therapeutic  injection on the left side.  An After Visit Summary was printed and given to the patient.  FOLLOW UP: Return in about 6 weeks (around 05/22/2023) for f/u CTS.  Signed:  Santiago Bumpers, MD           04/10/2023

## 2023-05-19 ENCOUNTER — Telehealth: Payer: Self-pay

## 2023-05-19 NOTE — Telephone Encounter (Signed)
Patient cancelled upcoming appointment with Dr. Truett Perna and did not wish to reschedule at this time. Stated that Dr. Truett Perna has sent him to a surgeon and he will follow up with the surgeon until he refers him back to Dr. Truett Perna 05/19/23

## 2023-05-22 ENCOUNTER — Encounter: Payer: Self-pay | Admitting: Family Medicine

## 2023-05-22 ENCOUNTER — Ambulatory Visit: Payer: 59 | Admitting: Family Medicine

## 2023-05-22 VITALS — BP 128/79 | HR 88 | Wt 207.8 lb

## 2023-05-22 DIAGNOSIS — G5603 Carpal tunnel syndrome, bilateral upper limbs: Secondary | ICD-10-CM

## 2023-05-22 DIAGNOSIS — M79642 Pain in left hand: Secondary | ICD-10-CM

## 2023-05-22 DIAGNOSIS — G8929 Other chronic pain: Secondary | ICD-10-CM

## 2023-05-22 NOTE — Progress Notes (Signed)
OFFICE VISIT  05/22/2023  CC:  Chief Complaint  Patient presents with   Hand Pain    Pt states his L hand has improved a lot. States he does not feel any tingling/numbness but still feels some pain in the thumb area. Does not take anything for the pain.     Patient is a 62 y.o. male who presents for 6-week follow-up carpal tunnel syndrome. A/P as of last visit: " bilateral hand numbness. Suspect carpal tunnel syndrome. Bedside MSK ultrasound today showed median nerve area 0.12 cm on the left and 0.1 cm on the right. Wrist splints recommended.  He will get these over-the-counter. If no improvement with adequate use of these in 6 weeks will likely proceed with diagnostic/therapeutic injection on the left side."  INTERIM HX: Left hand and wrist feeling significantly improved with wearing the wrist splint at night. Right hand bothers him only a little bit at this point.  Has a little bit of a pain intermittently in the thenar eminence of the left hand, particularly when he puts pressure on it.  Has been going on a couple of years now he recalls it starting out with an acute episode of putting his hand down forcefully and hearing a pop. He feels like it has swollen up a little bit in the area since that time.  No swelling of the wrist or finger joints.  Past Medical History:  Diagnosis Date   Arthritis    lower back   Asymmetrical sensorineural hearing loss    L>>R: ENT eval 04/2018.  Pt declined MRI brain at that time.   Cancer of appendix (HCC)    Colon adenocarcinoma (HCC) 1995   goblet cell adenocarcinoma of colon, dx'd when he got acute appendicitis w/perf-->path showed adenocarcinoma with positive margins.  CT chest neg for metastatic dz, CEA neg. R hemicolectomy with iliocolonic anastomosis 11/2020.   Coronary artery calcification    and aortic atherosclerosis.  Noted on 09/2021 CT that was done for cancer surveillance   Hepatic steatosis    noted on CT 09/2021 for cancer  surveillance   History of adenomatous polyp of colon 07/2016   History of vitamin D deficiency    still present as of 09/2017.  High dose vit D started 10/16/17.  Increased dose to 50K twice per week 02/18/18.   Hyperlipidemia    Mild, never meds.  Patient prefers no meds for this.   Metastatic malignant melanoma (HCC) 1989   Recurrence L upper arm 1993.  Then left back and LUL lung recurrence 1995-resected.  Brain lesion recurrence 1996-resected.  RLL lung lesion 1996--chemo & then resection.  Small bowel recurrence resected 1997, then mesenteric lesion resected 1998.  No sign of recurrence since 1998.  Released by onc/Dr. Magrinot as of 12/2015 (needs annual dermatologist exam and annual ophth exam).   Polyposis of colon    Prediabetes    A1c 5.9% 12/2014.  6.2% 09/2017. 6.3% 04/2019.    Past Surgical History:  Procedure Laterality Date   APPENDECTOMY     BIOPSY  03/12/2023   Procedure: BIOPSY;  Surgeon: Mansouraty, Netty Starring., MD;  Location: [facility];  Service: Gastroenterology;;   BRAIN SURGERY     Removal of melanoma met   COLON SURGERY  11/2020   Proximal hemicolectomy and lysis of adhesions and repair of incarcerated incisional hernia.  For colon ca resection.  iliocolonic anastomosis.   COLONOSCOPY  2009; 07/2016   11/27/20->adenoma   COLONOSCOPY WITH PROPOFOL N/A 08/18/2016   Tubular  adenoma.  Recall 3 yrs.  Procedure: COLONOSCOPY WITH PROPOFOL;  Surgeon: Charolett Bumpers, MD;  Location: [facility];  Service: Endoscopy;  Laterality: N/A;   ESOPHAGOGASTRODUODENOSCOPY N/A 03/12/2023   Procedure: ESOPHAGOGASTRODUODENOSCOPY (EGD);  Surgeon: Lemar Lofty., MD;  Location: [facility];  Service: Gastroenterology;  Laterality: N/A;   EUS N/A 03/12/2023   Procedure: UPPER ENDOSCOPIC ULTRASOUND (EUS) RADIAL;  Surgeon: Lemar Lofty., MD;  Location: [facility];  Service: Gastroenterology;  Laterality: N/A;   IR SINUS/FIST TUBE CHK-NON GI  10/08/2020   LAPAROSCOPIC  APPENDECTOMY N/A 09/28/2020   Procedure: APPENDECTOMY LAPAROSCOPIC;  Surgeon: Harriette Bouillon, MD;  Location: [facility];  Service: General;  Laterality: N/A;   LUNG SURGERY     "              "          "        "   SMALL INTESTINE SURGERY  1998   x2 from melanoma    Outpatient Medications Prior to Visit  Medication Sig Dispense Refill   Cholecalciferol (VITAMIN D3) 50 MCG (2000 UT) TABS Take 2,000 Units by mouth in the morning.     Omega-3 Fatty Acids (FISH OIL) 1000 MG CAPS Take by mouth. Taking 1200 mg     No facility-administered medications prior to visit.    Allergies  Allergen Reactions   Aspirin Nausea And Vomiting   Penicillins Other (See Comments)    Childhood  Has patient had a PCN reaction causing immediate rash, facial/tongue/throat swelling, SOB or lightheadedness with hypotension: unknown Has patient had a PCN reaction causing severe rash involving mucus membranes or skin necrosis: {unkjnown Has patient had a PCN reaction that required hospitalization {no Has patient had a PCN reaction occurring within the last 10 years: no If all of the above answers are "NO", then may proceed with Cephalosporin use.    Review of Systems As per HPI  PE:    05/22/2023    9:03 AM 04/10/2023    9:22 AM 03/12/2023    1:43 PM  Vitals with BMI  Height  5\' 8"    Weight 207 lbs 13 oz 202 lbs 13 oz   BMI  30.84   Systolic 128 118 161  Diastolic 79 71 77  Pulse 88 53 59    Physical Exam  Gen: Alert, well appearing.  Patient is oriented to person, place, time, and situation. AFFECT: pleasant, lucid thought and speech. Left hand thenar eminence with a puckering skin/soft tissue deformity that is subtle.  Mild discomfort to palpation over this area but nothing really remarkable.  No tenderness over the [facility] or first MCP joints.  Range of motion of wrist, thumb, and all fingers normal.  LABS: Last CBC Lab Results  Component Value Date   WBC 5.4 10/09/2022   HGB 14.9 10/09/2022    HCT 43.9 10/09/2022   MCV 86.9 10/09/2022   MCH 30.4 10/01/2021   RDW 12.9 10/09/2022   PLT 225.0 10/09/2022   Last metabolic panel Lab Results  Component Value Date   GLUCOSE 95 04/10/2023   NA 139 04/10/2023   K 4.3 04/10/2023   CL 103 04/10/2023   CO2 29 04/10/2023   BUN 14 04/10/2023   CREATININE 0.81 04/10/2023   GFR 95.10 04/10/2023   CALCIUM 9.1 04/10/2023   PHOS 4.2 12/01/2020   PROT 7.1 10/09/2022   ALBUMIN 4.3 10/09/2022   BILITOT 0.5 10/09/2022   ALKPHOS 44 10/09/2022   AST 18 10/09/2022  ALT 23 10/09/2022   ANIONGAP 7 10/01/2021   Lab Results  Component Value Date   HGBA1C 5.7 (A) 04/10/2023   HGBA1C 5.7 04/10/2023   HGBA1C 5.7 04/10/2023   HGBA1C 5.7 04/10/2023   IMPRESSION AND PLAN:  #1 carpal tunnel syndrome, significantly improved with use of wrist splint on the left wrist.  Right hand symptoms minimal at this point.  Plan is to continue wrist plan nighttime and as needed daytime.  2.  Left hand thenar eminence pain.  Tough to tell what this is coming from.  He does have a little puckering deformity which could indicate some Dupuytren's disease.  Bedside MSK ultrasound today showed a normal flexor pollicis longus tendon coursing through the thenar eminence.  A little bit of spurring and narrowing of the joint space of the trapeziometacarpal joint.  No new plans for this at this time.  Observe.  #3 abdominal nodule.  He recently saw the surgical oncologist at [facility] and was reassured that he did not think that he actually has cancer.  Recommended follow-up PET scan 6 months. Patient is reassured.  An After Visit Summary was printed and given to the patient.  FOLLOW UP: Return for cpe 4 mo.  Signed:  Santiago Bumpers, MD           05/22/2023

## 2023-07-02 ENCOUNTER — Inpatient Hospital Stay: Payer: 59 | Admitting: Oncology

## 2023-07-28 NOTE — Progress Notes (Signed)
 SURGICAL ONCOLOGY CLINIC VISIT  CC: Appendiceal cancer, Goblet cell  HISTORY OF THE PRESENT ILLNESS:  Sean [name] is a 63 y.o. 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  He presented with perforated appendicitis in 09/2020. Underwent laparoscopic appendectomy c/b intraabdominal abscess requiring drain placement. 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 In May 2022 he underwent completion R hemicolectomy for the positive margin which did not have any residual cancer within the specimen. All nodes negative.   Completed 8 cycles of adjuvant Xeloda  06/2021. Followed by oncology with imaging, developed new intraabdominal nodules 09/2022 on imaging. Referred for EUS to attempt to biopsy one of the lesions, however there was nothing identified to biopsy.   Additionally has a history of metastatic melanoma, primary on LUE resected 1989. Recurrent disease to L back, LLL, brain, R lung, and small bowel/mesentery, all treated surgically. Last surgical resection 1998. Also received IL-2 and immunotherapy at [facility].   History of Present Illness 07/29/23:  The patient presents for evaluation of spots on his scan. He is accompanied by his son. He reports that the previously identified spots have been gradually diminishing in size.  PHYSICAL EXAMINATION: GENERAL APPEARANCE: well-groomed, healthy appearing 62 y.o. male, pleasant and in no apparent distress. VITAL SIGNS:  Vitals:   07/29/23 0937  BP: 135/79  Pulse: 54  Temp: 97.2 F (36.2 C)  SpO2: 97%   NECK: Supple and symmetrical, trachea midline. LYMPH NODES: No cervical, axillary, or supraclavicular adenopathy.  HEART: Normal rate, appears well perfused LUNGS:   Symmetric expansion of the chest, non labored breathing ABDOMEN: Soft, nontender. Non distended. Well healed laparotomy and port sites.  EXTREMITES:  The patient is ambulatory.  The extremities are warm and without  edema.   NEUROLOGIC:  The patient is alert, oriented and converant, without gross motor deficits. PERFORMANCE STATUS:  0  PATHOLOGY:   PATHOLOGY:   SURGICAL PATHOLOGY  CASE: [id number]  PATIENT: Sean [name]  Surgical Pathology Report  Clinical History: Cancer of appendix; cecal cancer and partial small  bowel obstruction (crm)  FINAL MICROSCOPIC DIAGNOSIS:  A. COLON, RIGHT, RESECTION:  - Inflammatory changes and fibrosis consistent with previous  appendectomy.  - No residual goblet cell carcinoid identified.  - Final margins negative for tumor.  - Twenty three lymph nodes negative for metastatic tumor (0/23).  - See comment.   COMMENT:  There is focal inflammation with fat necrosis, giant cell reaction and  fibrosis consistent with previous appendectomy.  The entire appendiceal  stump is examined histologically and no residual goblet cell carcinoid  is identified.  Immunohistochemistry for cytokeratin AE1/AE3 supports  the diagnosis.  The updated TNM is pT3, pN0  GROSS DESCRIPTION:  Specimen: Received fresh labeled right colon  Specimen integrity: Intact, with 2 stapled resection margins  Specimen length: There is 8.5 cm of terminal ileum and 45.0 cm of colon  Mesorectal intactness: Not applicable  Tumor location: The presumed tumor is within the appendix  Tumor size: Identified at the cecum is a portion of appendix, measuring  4.4 cm in length by 0.7 cm in diameter.  The end of the appendix  displays embedded staples, grossly consistent with previous appendectomy  site.  Sectioning the appendix reveals a tan-pink mucosa, with a  minimally dilated lumen.  No distinct mass is grossly identified.  Percent of bowel circumference involved: Not applicable  Tumor distance to margins:  Proximal: Approximately 10.5 cm from the proximal  margin to the appendiceal orifice                       Distal: 45.0 cm                       Mesenteric (sigmoid and  transverse): Nonapplicable                       Radial (posterior ascending, posterior descending;  lateral and posterior mid-rectum; and entire lower 1/3 rectum):  Nonapplicable  Macroscopic extent of tumor invasion: Not grossly identified  Total presumed lymph nodes: 10 tan-red to gray possible lymph nodes  identified, ranging from 0.1 cm to 1.0 x 0.8 x 0.4 cm.  Remaining fat is  placed in clearing solution for additional possible lymph node  identification.  Following clearing 19 additional possible lymph nodes  are identified, ranging from 0.1 to 0.3 cm in greatest dimension.  Extramural satellite tumor nodules: None identified  Mucosal polyp(s): No polyps are grossly identified.  Approximately 5  tan-red, hyperemic punctate areas are noted along the colon, grossly  suggestive of previous biopsy sites.  Additional findings: The uninvolved mucosa is tan-pink with normal  folding.  Identified within the adipose tissue near the cecum there is a  1.0 cm in greatest dimension yellow, indurated area identified.  Block summary:  1 = proximal margin  2 and 3 = distal margins  4 and 5 = possible biopsy sites  6 = indurated adipose tissue  7-10 = appendix, entirely submitted from distal to proximal  11 = 4 possible lymph nodes  12 = 3 possible lymph nodes  13 = 2 possible lymph nodes  14 = 1 possible lymph node, bisected (KL 11/28/2020)  15-17 = 5 additional possible lymph nodes  18 = 4 additional possible lymph nodes (KL 11/29/2020)   ======================== RADIOGRAPHIC IMAGING: PET COLON OR RECTAL CANCER (W/LOW DOSE CT) Narrative: PET/CT SKULL BASE TO MID THIGH, 07/09/2023 11:50 AM  INDICATION: hx appendiceal ca eval intrapertioneal nodules, appendiceal ca c/f peritoneal nodules, Malignant neoplasm of appendix (CMD) \ C18.1 Malignant neoplasm of appendix (CMD)  hx appendiceal ca eval intrapertioneal nodules  ADDITIONAL HISTORY: 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 Additional history of left arm melanoma status post recurrence in the left upper arm which was excised, recurrence in the left thoracic, left lower lung, and brain lesion which were resected. Resection of a small bowel lesion in 1997 and mesenteric lesion in 1998. COMPARISON: FDG PET/CT dated 10/24/2022. Abdomen pelvis dated 02/07/2023.  LIMITATIONS: None.  FINDINGS:   DISEASE-RELATED FINDINGS:  Punctate focus of uptake in the right paracentral posterior mesentery with 2.2 (image 209) without definite CT correlate appears to localize to bowel on the sagittal AC images and may be physiologic. No abnormal uptake greater than blood pool in the previously seen mesenteric nodes, similar in appearance compared with 01/27/2023 on image 203). No definite uptake in the region of the implant along the right lower quadrant ileum, with the nodule that was seen on the current exam. Previously seen soft tissue nodule anterior to the body of the pancreas and posterior to the transverse colon has resolved, with minimal fat stranding in this location without uptake greater than blood pool (image 162).  Previously seen 0.5 cm soft tissue nodule in the right paracolic gutter is less conspicuous on  the current exam, too small to characterize by PET (image 224). OTHER TRACER-RELATED FINDINGS: Expected physiologic activity within the kidneys, ureter, bladder, oropharynx, salivary glands, stomach, bowel, and brain.   ANCILLARY CT FINDINGS: Heavy coronary calcifications. Postsurgical changes in the right lower lobe. Hypoattenuating photopenic cyst in the right upper kidney. Similar fat necrosis in the right upper quadrant. Postsurgical changes of the colon and small bowel. Multilevel anterior disc osteophyte complex formation. Fat-containing umbilical hernia. Impression: CONCLUSION: 1. Punctate focus of uptake in the right paracentral mesentery without definite CT correlate,  which may represent uptake within bowel, possibly physiologic, versus less likely may correspond with a mesenteric node in the setting of misregistration. Attention on follow-up.  2. No definite abnormal uptake in the previously visualized mesenteric nodes or within the right lower quadrant mesentery with the previously seen serosal deposit better evaluated on the prior CT abdomen pelvis.  CT ABDOMEN PELVIS W CONTRAST, 01/27/2023 9:38 AM   INDICATION: Malignant neoplasm of appendix (CMS/HCC) \ C18.1 Malignant neoplasm of appendix (CMS/HCC)    COMPARISON: CT chest abdomen pelvis 09/30/2022.   TECHNIQUE: CT images of the abdomen and pelvis were obtained after intravenous administration of iodinated contrast. Conventional axial reconstructions and multiplanar reformatted images were submitted for review.    FINDINGS:   . Lower Chest: Coronary artery calcifications. Myocardial thinning of the left ventricular apex.   . Liver: Steatosis. . Gallbladder/Biliary: Unremarkable. SABRA Spleen: Unremarkable. . Pancreas: Unremarkable. . Adrenals: Unremarkable. . Kidneys: Symmetric enhancement without hydronephrosis. Right interpolar renal cortical cyst.   . Peritoneum/Mesenteries/Extraperitoneum: Sequela of right upper quadrant fat necrosis. The area of fat necrosis extends inferiorly from the gallbladder fossa. Multiple small lymph nodes within the ileal mesentery of the right abdomen, more notable by their number than individual size. Interval decrease in size of some nodes, with the largest now measuring up to 6 mm in short axis, compared to 8 mm previously. Negative for ascites or loculated fluid collection. Small implant along the serosal margin of the right lower quadrant ileum has decreased in size from 2.3 x 2.0 cm to current measurements of 1.1 x 1.2 cm, reference image 135 of series 2. . Gastrointestinal tract: Positive enteric contrast media is seen throughout the stomach, small bowel, and colon.  Status post right hemicolectomy with a patent right upper quadrant ileocolonic anastomosis.   . Ureters: Unremarkable. . Bladder: Unremarkable. . Reproductive System: Unremarkable.   . Vascular: Scattered atherosclerotic change throughout an otherwise nonaneurysmal abdominal aorta. . Musculoskeletal: Postoperative changes in the ventral abdominal wall, near midline. Small left of midline fascial defect with herniation of omental fat, mid abdomen. Similar small omental fat-containing hernia to the left of the umbilicus. Degenerative spondylosis throughout the visualized thoracolumbar spine. Polyarticular osteoarthritis.   IMPRESSION:   1. Unchanged postoperative findings as described above.  2. Significant interval decrease in size of a low-attenuation/mucinous implant along the serosa of ileum in the right lower quadrant. No new potential sites of disease are identified. 3. Similar to slight decreasing size of ileal mesenteric lymph nodes, as noted above.  4. Additional unchanged ancillary findings as noted above.   I have personally reviewed the procedure note and/or have reviewed and interpreted this image/images. Electronically Signed By: Alm JONETTA Lovett on 01/27/2023 11:55 AM  ASSESSMENT AND PLAN:  Sean [name] is a 63 y.o. male with a diagnosis of appendiceal cancer in 2022 now with c/f peritoneal nodularity. These are not hypermetabolic on PET, which is less concerning for melanoma as the source. There are  also very few lesions which is reassuring. His repeat imaging has been stable since the lesions were first identified, which is also reassuring. Given this short interval follow up with repeat imaging is reasonable.   --RTC  6 months, imaging prior, no labs --Continue with diet and exercise  This visit was a shared visit with Dr. Claryce,  who agrees with the plan. Electronically signed by:  Channing Hendricks Dike, PA-C, 07/29/2023 10:37 AM  Attestation (Dr. Claryce):  I personally  confirmed the history, performed the examination and reviewed the data for  and imaging for Sean [name] myself on the day of the clinic visit.  I saw the patient with Channing Dike, PA (Physician Assistant) as a shared visit .  I concur with the findings above.  We discussed the implications of the diagnosis and options for surveillance and/or therapy.  Medical decision making in its entirety as documented by Channing Dike, PA was personally performed by myself. I have reviewed this documentation  and agree with it as stated. No worrisome findings, follow up with repeat imaging.  Edward A. Claryce, M.D.  Professor of Surgery  Chief, Surgical Oncology Service  07/29/2023 4:24 PM

## 2023-08-20 ENCOUNTER — Telehealth: Payer: Self-pay | Admitting: *Deleted

## 2023-08-20 NOTE — Telephone Encounter (Signed)
Called patient after Dr. Truett Perna received note from [facility] regarding his recent OV and scan. Asking if he was willing to see Dr. Truett Perna in next couple months or after his July CT and [facility] visit. He prefers after July scan. Scheduling message sent to see Dr. Truett Perna in August.

## 2023-11-20 ENCOUNTER — Ambulatory Visit (INDEPENDENT_AMBULATORY_CARE_PROVIDER_SITE_OTHER): Admitting: Family Medicine

## 2023-11-20 ENCOUNTER — Encounter: Payer: Self-pay | Admitting: Family Medicine

## 2023-11-20 VITALS — BP 109/69 | HR 57 | Temp 99.0°F | Ht 69.0 in | Wt 203.0 lb

## 2023-11-20 DIAGNOSIS — R7303 Prediabetes: Secondary | ICD-10-CM | POA: Diagnosis not present

## 2023-11-20 DIAGNOSIS — R6882 Decreased libido: Secondary | ICD-10-CM | POA: Diagnosis not present

## 2023-11-20 DIAGNOSIS — Z125 Encounter for screening for malignant neoplasm of prostate: Secondary | ICD-10-CM

## 2023-11-20 DIAGNOSIS — Z0000 Encounter for general adult medical examination without abnormal findings: Secondary | ICD-10-CM

## 2023-11-20 DIAGNOSIS — E559 Vitamin D deficiency, unspecified: Secondary | ICD-10-CM | POA: Diagnosis not present

## 2023-11-20 DIAGNOSIS — E7800 Pure hypercholesterolemia, unspecified: Secondary | ICD-10-CM

## 2023-11-20 LAB — LIPID PANEL
Cholesterol: 200 mg/dL (ref 0–200)
HDL: 43.7 mg/dL (ref >39.00)
LDL Cholesterol: 137 mg/dL — ABNORMAL HIGH (ref 0–99)
NonHDL: 156.01
Total CHOL/HDL Ratio: 5
Triglycerides: 97 mg/dL (ref 0.0–149.0)
VLDL: 19.4 mg/dL (ref 0.0–40.0)

## 2023-11-20 LAB — PSA: PSA: 0.71 ng/mL (ref 0.10–4.00)

## 2023-11-20 LAB — COMPREHENSIVE METABOLIC PANEL WITH GFR
ALT: 25 U/L (ref 0–53)
AST: 17 U/L (ref 0–37)
Albumin: 4.4 g/dL (ref 3.5–5.2)
Alkaline Phosphatase: 43 U/L (ref 39–117)
BUN: 17 mg/dL (ref 6–23)
CO2: 29 meq/L (ref 19–32)
Calcium: 8.8 mg/dL (ref 8.4–10.5)
Chloride: 103 meq/L (ref 96–112)
Creatinine, Ser: 0.87 mg/dL (ref 0.40–1.50)
GFR: 92.67 mL/min (ref >60.00)
Glucose, Bld: 106 mg/dL — ABNORMAL HIGH (ref 70–99)
Potassium: 4.2 meq/L (ref 3.5–5.1)
Sodium: 138 meq/L (ref 135–145)
Total Bilirubin: 0.6 mg/dL (ref 0.2–1.2)
Total Protein: 7.1 g/dL (ref 6.0–8.3)

## 2023-11-20 LAB — VITAMIN D 25 HYDROXY (VIT D DEFICIENCY, FRACTURES): VITD: 30.89 ng/mL (ref 30.00–100.00)

## 2023-11-20 LAB — HEMOGLOBIN A1C: Hgb A1c MFr Bld: 6.3 % (ref 4.6–6.5)

## 2023-11-20 LAB — TESTOSTERONE: Testosterone: 177.38 ng/dL — ABNORMAL LOW (ref 300.00–890.00)

## 2023-11-20 NOTE — Progress Notes (Signed)
 Office Note 11/20/2023  CC:  Chief Complaint  Patient presents with   Annual Exam    Pt is fasting    Patient is a 63 y.o. male who is here for annual health maintenance exam and follow-up hypercholesterolemia and prediabetes.   INTERIM HX: Mont Antis feels well. He does great with healthy diet and regular exercise daily.  He expresses more concern about cholesterol levels today and we discussed cholesterol medications some.  Past Medical History:  Diagnosis Date   Arthritis    lower back   Asymmetrical sensorineural hearing loss    L>>R: ENT eval 04/2018.  Pt declined MRI brain at that time.   Cancer of appendix (HCC)    Colon adenocarcinoma (HCC) 1995   goblet cell adenocarcinoma of colon, dx'd when he got acute appendicitis w/perf-->path showed adenocarcinoma with positive margins.  CT chest neg for metastatic dz, CEA neg. R hemicolectomy with iliocolonic anastomosis 11/2020.   Coronary artery calcification    and aortic atherosclerosis.  Noted on 09/2021 CT that was done for cancer surveillance   Hepatic steatosis    noted on CT 09/2021 for cancer surveillance   History of adenomatous polyp of colon 07/2016   History of vitamin D  deficiency    still present as of 09/2017.  High dose vit D started 10/16/17.  Increased dose to 50K twice per week 02/18/18.   Hyperlipidemia    Mild, never meds.  Patient prefers no meds for this.   Metastatic malignant melanoma (HCC) 1989   Recurrence L upper arm 1993.  Then left back and LUL lung recurrence 1995-resected.  Brain lesion recurrence 1996-resected.  RLL lung lesion 1996--chemo & then resection.  Small bowel recurrence resected 1997, then mesenteric lesion resected 1998.  No sign of recurrence since 1998.  Released by onc/Dr. Magrinot as of 12/2015 (needs annual dermatologist exam and annual ophth exam).   Polyposis of colon    Prediabetes    A1c 5.9% 12/2014.  6.2% 09/2017. 6.3% 04/2019.    Past Surgical History:  Procedure Laterality  Date   APPENDECTOMY     BIOPSY  03/12/2023   Procedure: BIOPSY;  Surgeon: Mansouraty, Albino Alu., MD;  Location: [facility];  Service: Gastroenterology;;   BRAIN SURGERY     Removal of melanoma met   COLON SURGERY  11/2020   Proximal hemicolectomy and lysis of adhesions and repair of incarcerated incisional hernia.  For colon ca resection.  iliocolonic anastomosis.   COLONOSCOPY  2009; 07/2016   11/27/20->adenoma   COLONOSCOPY WITH PROPOFOL  N/A 08/18/2016   Tubular adenoma.  Recall 3 yrs.  Procedure: COLONOSCOPY WITH PROPOFOL ;  Surgeon: Garrett Kallman, MD;  Location: [facility];  Service: Endoscopy;  Laterality: N/A;   ESOPHAGOGASTRODUODENOSCOPY N/A 03/12/2023   Procedure: ESOPHAGOGASTRODUODENOSCOPY (EGD);  Surgeon: Normie Becton., MD;  Location: [facility];  Service: Gastroenterology;  Laterality: N/A;   EUS N/A 03/12/2023   Procedure: UPPER ENDOSCOPIC ULTRASOUND (EUS) RADIAL;  Surgeon: Normie Becton., MD;  Location: [facility];  Service: Gastroenterology;  Laterality: N/A;   IR SINUS/FIST TUBE CHK-NON GI  10/08/2020   LAPAROSCOPIC APPENDECTOMY N/A 09/28/2020   Procedure: APPENDECTOMY LAPAROSCOPIC;  Surgeon: Sim Dryer, MD;  Location: [facility];  Service: General;  Laterality: N/A;   LUNG SURGERY     "              "          "        "   SMALL INTESTINE SURGERY  1998   x2 from melanoma    Family History  Problem Relation Age of Onset   Breast cancer Mother 61       metastatic   Alcohol abuse Father    Thyroid  cancer Sister 13   Colon cancer Neg Hx    Esophageal cancer Neg Hx    Stomach cancer Neg Hx    Rectal cancer Neg Hx     Social History   Socioeconomic History   Marital status: Divorced    Spouse name: Not on file   Number of children: 2   Years of education: Not on file   Highest education level: Not on file  Occupational History    Employer: [employer]    Comment: Driver  Tobacco Use   Smoking status: Never   Smokeless tobacco: Former     Quit date: 2021  Vaping Use   Vaping status: Never Used  Substance and Sexual Activity   Alcohol use: Not Currently    Comment: 1 drink/ 2 weeks   Drug use: No   Sexual activity: Yes  Other Topics Concern   Not on file  Social History Narrative   Married, 2 sons.   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 Drives a lot.   Orig from [place].   No T/A/Ds.   Diet ok except fast foods.   Social Drivers of Corporate investment banker Strain: Low Risk  (11/17/2023)   Overall Financial Resource Strain (CARDIA)    Difficulty of Paying Living Expenses: Not hard at all  Food Insecurity: No Food Insecurity (11/17/2023)   Hunger Vital Sign    Worried About Running Out of Food in the Last Year: Never true    Ran Out of Food in the Last Year: Never true  Transportation Needs: No Transportation Needs (11/17/2023)   PRAPARE - Administrator, Civil Service (Medical): No    Lack of Transportation (Non-Medical): No  Physical Activity: Sufficiently Active (11/17/2023)   Exercise Vital Sign    Days of Exercise per Week: 4 days    Minutes of Exercise per Session: 40 min  Stress: No Stress Concern Present (11/17/2023)   [employer] of Occupational Health - Occupational Stress Questionnaire    Feeling of Stress : Not at all  Social Connections: Unknown (11/17/2023)   Social Connection and Isolation Panel [NHANES]    Frequency of Communication with Friends and Family: More than three times a week    Frequency of Social Gatherings with Friends and Family: More than three times a week    Attends Religious Services: Patient declined    Database administrator or Organizations: No    Attends Engineer, structural: Not on file    Marital Status: Divorced  Intimate Partner Violence: Unknown (10/21/2021)   Received from [employer], [facility]   HITS    Physically Hurt: Not on file    Insult or Talk Down To: Not on file    Threaten Physical Harm: Not on file    Scream or Curse: Not on file     Outpatient Medications Prior to Visit  Medication Sig Dispense Refill   Cholecalciferol  (VITAMIN D3) 50 MCG (2000 UT) TABS Take 2,000 Units by mouth in the morning.     Omega-3 Fatty Acids (FISH OIL) 1000 MG CAPS Take by mouth. Taking 1200 mg     No facility-administered medications prior to visit.    Allergies  Allergen Reactions   Aspirin Nausea And Vomiting   Penicillins Other (  See Comments)    Childhood  Has patient had a PCN reaction causing immediate rash, facial/tongue/throat swelling, SOB or lightheadedness with hypotension: unknown Has patient had a PCN reaction causing severe rash involving mucus membranes or skin necrosis: {unkjnown Has patient had a PCN reaction that required hospitalization {no Has patient had a PCN reaction occurring within the last 10 years: no If all of the above answers are "NO", then may proceed with Cephalosporin use.    Review of Systems  Constitutional:  Negative for appetite change, chills, fatigue and fever.  HENT:  Negative for congestion, dental problem, ear pain and sore throat.   Eyes:  Negative for discharge, redness and visual disturbance.  Respiratory:  Negative for cough, chest tightness, shortness of breath and wheezing.   Cardiovascular:  Negative for chest pain, palpitations and leg swelling.  Gastrointestinal:  Negative for abdominal pain, blood in stool, diarrhea, nausea and vomiting.  Genitourinary:  Negative for difficulty urinating, dysuria, flank pain, frequency, hematuria and urgency.  Musculoskeletal:  Negative for arthralgias, back pain, joint swelling, myalgias and neck stiffness.  Skin:  Negative for pallor and rash.  Neurological:  Negative for dizziness, speech difficulty, weakness and headaches.  Hematological:  Negative for adenopathy. Does not bruise/bleed easily.  Psychiatric/Behavioral:  Negative for confusion and sleep disturbance. The patient is not nervous/anxious.     PE;    11/20/2023    8:51 AM  05/22/2023    9:03 AM 04/10/2023    9:22 AM  Vitals with BMI  Height 5\' 9"   5\' 8"   Weight 203 lbs 207 lbs 13 oz 202 lbs 13 oz  BMI 29.96  30.84  Systolic 109 128 409  Diastolic 69 79 71  Pulse 57 88 53     Gen: Alert, well appearing.  Patient is oriented to person, place, time, and situation. AFFECT: pleasant, lucid thought and speech. ENT: Ears: EACs clear, normal epithelium.  TMs with good light reflex and landmarks bilaterally.  Eyes: no injection, icteris, swelling, or exudate.  EOMI, PERRLA. Nose: no drainage or turbinate edema/swelling.  No injection or focal lesion.  Mouth: lips without lesion/swelling.  Oral mucosa pink and moist.  Dentition intact and without obvious caries or gingival swelling.  Oropharynx without erythema, exudate, or swelling.  Neck: supple/nontender.  No LAD, mass, or TM.  Carotid pulses 2+ bilaterally, without bruits. CV: RRR, no m/r/g.   LUNGS: CTA bilat, nonlabored resps, good aeration in all lung fields. ABD: soft, NT, ND, BS normal.  No hepatospenomegaly or mass.  No bruits. EXT: no clubbing, cyanosis, or edema.  Musculoskeletal: no joint swelling, erythema, warmth, or tenderness.  ROM of all joints intact. Skin - no sores or suspicious lesions or rashes or color changes  Pertinent labs:  Lab Results  Component Value Date   TSH 3.00 10/09/2022   Lab Results  Component Value Date   WBC 5.4 10/09/2022   HGB 14.9 10/09/2022   HCT 43.9 10/09/2022   MCV 86.9 10/09/2022   PLT 225.0 10/09/2022   Lab Results  Component Value Date   CREATININE 0.81 04/10/2023   BUN 14 04/10/2023   NA 139 04/10/2023   K 4.3 04/10/2023   CL 103 04/10/2023   CO2 29 04/10/2023   Lab Results  Component Value Date   ALT 23 10/09/2022   AST 18 10/09/2022   ALKPHOS 44 10/09/2022   BILITOT 0.5 10/09/2022   Lab Results  Component Value Date   CHOL 176 04/10/2023   Lab Results  Component Value Date   HDL 43.50 04/10/2023   Lab Results  Component Value Date    LDLCALC 104 (H) 04/10/2023   Lab Results  Component Value Date   TRIG 143.0 04/10/2023   Lab Results  Component Value Date   CHOLHDL 4 04/10/2023   Lab Results  Component Value Date   PSA 0.99 10/09/2022   PSA 0.61 10/03/2021   PSA 0.80 07/16/2020   Lab Results  Component Value Date   HGBA1C 5.7 (A) 04/10/2023   HGBA1C 5.7 04/10/2023   HGBA1C 5.7 04/10/2023   HGBA1C 5.7 04/10/2023  Last vitamin D  Lab Results  Component Value Date   VD25OH 28.04 (L) 04/10/2022   ASSESSMENT AND PLAN:   #1 health maintenance exam: Reviewed age and gender appropriate health maintenance issues (prudent diet, regular exercise, health risks of tobacco and excessive alcohol, use of seatbelts, fire alarms in home, use of sunscreen).  Also reviewed age and gender appropriate health screening as well as vaccine recommendations. Vaccines:  Shingrix--pt declined.  Prevnar 20 -->declined.  otherwise up-to-date. Labs: HP, A1c, PSA, vit D Prostate ca screening: PSA today Colon ca screening: hx adenomatous polyps, most recent colonoscopy 11/27/20, further timing of colonoscopies per GI (2025 per pt report today).  #2 hypercholesterolemia.  His 10-year Framingham cardiovascular risk has been borderline intermediate and he has not want to be on any medication in the past. He is expressing more interested in trying statin and we will have to just see what his LDL is today.  #3 prediabetes.  He is working hard on diet and exercise. Fasting glucose and hemoglobin A1c today.  4.  Decreased libido.  He requests testosterone level checked today.  5.  Vitamin D  deficiency.  He is currently taking 2000 units vitamin D  daily. Check vitamin D  level today.  An After Visit Summary was printed and given to the patient.  FOLLOW UP:  Return in about 6 months (around 05/22/2024) for routine chronic illness f/u.  Signed:  Arletha Lady, MD           11/20/2023

## 2023-11-20 NOTE — Patient Instructions (Signed)
   It was very nice to see you today! Please contact [facility] to schedule your repeat colonoscopy at [phone].  PLEASE NOTE:  If labs were collected or images ordered, we will inform you of  results once we have received them and reviewed. We will contact you either by echart message, or telephone call.    If we ordered any referrals today, please let us  know if you have not heard from their office within the next 2 weeks. You should receive a letter via MyChart confirming if the referral was approved and their office contact information to schedule.   Please try these tips to maintain a healthy lifestyle:  Eat most of your calories during the day when you are active. Eliminate processed foods including packaged sweets (pies, cakes, cookies), reduce intake of potatoes, white bread, white pasta, and white rice. Look for whole grain options, oat flour or almond flour.  Each meal should contain half fruits/vegetables, one quarter protein, and one quarter carbs (no bigger than a computer mouse).  Cut down on sweet beverages. This includes juice, soda, and sweet tea. Also watch fruit intake, though this is a healthier sweet option, it still contains natural sugar! Limit to 3 servings daily.  Drink at least 1 glass of water with each meal and aim for at least 8 glasses per day  Exercise at least 150 minutes every week.

## 2023-11-22 LAB — CBC WITH DIFFERENTIAL/PLATELET
Basophils Absolute: 0 10*3/uL (ref 0.0–0.1)
Basophils Relative: 0.9 % (ref 0.0–3.0)
Eosinophils Absolute: 0.1 10*3/uL (ref 0.0–0.7)
Eosinophils Relative: 1.8 % (ref 0.0–5.0)
HCT: 44.4 % (ref 39.0–52.0)
Hemoglobin: 14.9 g/dL (ref 13.0–17.0)
Lymphocytes Relative: 31.8 % (ref 12.0–46.0)
Lymphs Abs: 1.6 10*3/uL (ref 0.7–4.0)
MCHC: 33.7 g/dL (ref 30.0–36.0)
MCV: 88.6 fl (ref 78.0–100.0)
Monocytes Absolute: 0.4 10*3/uL (ref 0.1–1.0)
Monocytes Relative: 7.9 % (ref 3.0–12.0)
Neutro Abs: 2.9 10*3/uL (ref 1.4–7.7)
Neutrophils Relative %: 57.6 % (ref 43.0–77.0)
Platelets: 245 10*3/uL (ref 150.0–400.0)
RBC: 5.01 Mil/uL (ref 4.22–5.81)
RDW: 13.2 % (ref 11.5–15.5)
WBC: 5.1 10*3/uL (ref 4.0–10.5)

## 2023-11-25 ENCOUNTER — Encounter: Payer: Self-pay | Admitting: Gastroenterology

## 2023-11-26 ENCOUNTER — Telehealth: Payer: Self-pay

## 2023-11-26 NOTE — Telephone Encounter (Signed)
 Communication  Reason for CRM: Patient is calling because he reviewed his lab results from Mychart and noticed some results were abnormal. He hasn't received a call and would like to speak with someone regarding his results and next steps.      Pt requesting update on most recent labs. Please advise.

## 2023-11-27 ENCOUNTER — Telehealth: Payer: Self-pay

## 2023-11-27 ENCOUNTER — Encounter: Payer: Self-pay | Admitting: Family Medicine

## 2023-11-27 DIAGNOSIS — R7989 Other specified abnormal findings of blood chemistry: Secondary | ICD-10-CM

## 2023-11-27 NOTE — Telephone Encounter (Signed)
 I sent patient a MyChart message about his results.

## 2023-11-27 NOTE — Telephone Encounter (Signed)
Okay, orders are in 

## 2023-11-27 NOTE — Telephone Encounter (Signed)
 Copied from CRM [id number]. Topic: Clinical - Request for Lab/Test Order >> Nov 27, 2023  8:58 AM Sean [name] wrote: Reason for CRM:  Patient scheduled fasting testosterone  recheck appointment for Monday 5/12 and needs order put in  Lab order pending, please review dx and sign order if ok

## 2023-11-27 NOTE — Telephone Encounter (Signed)
 MyChart message read.

## 2023-11-30 ENCOUNTER — Other Ambulatory Visit (INDEPENDENT_AMBULATORY_CARE_PROVIDER_SITE_OTHER)

## 2023-11-30 ENCOUNTER — Encounter: Payer: Self-pay | Admitting: Family Medicine

## 2023-11-30 DIAGNOSIS — R7989 Other specified abnormal findings of blood chemistry: Secondary | ICD-10-CM

## 2023-11-30 LAB — TESTOSTERONE: Testosterone: 166.09 ng/dL — ABNORMAL LOW (ref 300.00–890.00)

## 2023-11-30 LAB — LUTEINIZING HORMONE: LH: 3.19 m[IU]/mL (ref 1.50–9.30)

## 2023-12-01 LAB — PROLACTIN: Prolactin: 9.1 ng/mL (ref 2.0–18.0)

## 2023-12-16 ENCOUNTER — Ambulatory Visit (INDEPENDENT_AMBULATORY_CARE_PROVIDER_SITE_OTHER): Admitting: Family Medicine

## 2023-12-16 ENCOUNTER — Encounter: Payer: Self-pay | Admitting: Family Medicine

## 2023-12-16 VITALS — BP 127/71 | HR 55 | Temp 98.1°F | Ht 69.0 in | Wt 207.4 lb

## 2023-12-16 DIAGNOSIS — E7800 Pure hypercholesterolemia, unspecified: Secondary | ICD-10-CM | POA: Diagnosis not present

## 2023-12-16 DIAGNOSIS — R7303 Prediabetes: Secondary | ICD-10-CM

## 2023-12-16 DIAGNOSIS — E291 Testicular hypofunction: Secondary | ICD-10-CM

## 2023-12-16 MED ORDER — TESTOSTERONE CYPIONATE 200 MG/ML IM SOLN
INTRAMUSCULAR | 0 refills | Status: DC
Start: 2023-12-16 — End: 2024-03-09

## 2023-12-16 NOTE — Telephone Encounter (Signed)
 Please assist with completion of PA for Testosterone 

## 2023-12-16 NOTE — Progress Notes (Signed)
 OFFICE VISIT  12/16/2023  CC:  Chief Complaint  Patient presents with   Discuss Lab Results   Patient is a 63 y.o. male who presents for discussion of recent lab results from 11/20/23 office visit.  INTERIM HX: Testosterone  level was low, repeat confirmed this. LDL was 137, patient to consider statin. Hemoglobin A1c rose to 6.3%.  [place] feels well other than having no libido.  He does also say that he has decreased stamina to exercise that he has gradually felt over the last few years. He states he has no erectile dysfunction.    Past Medical History:  Diagnosis Date   Arthritis    lower back   Asymmetrical sensorineural hearing loss    L>>R: ENT eval 04/2018.  Pt declined MRI brain at that time.   Cancer of appendix (HCC)    Colon adenocarcinoma (HCC) 1995   goblet cell adenocarcinoma of colon, dx'd when he got acute appendicitis w/perf-->path showed adenocarcinoma with positive margins.  CT chest neg for metastatic dz, CEA neg. R hemicolectomy with iliocolonic anastomosis 11/2020.   Coronary artery calcification    and aortic atherosclerosis.  Noted on 09/2021 CT that was done for cancer surveillance   Hepatic steatosis    noted on CT 09/2021 for cancer surveillance   History of adenomatous polyp of colon 07/2016   History of vitamin D  deficiency    still present as of 09/2017.  High dose vit D started 10/16/17.  Increased dose to 50K twice per week 02/18/18.   Hyperlipidemia    Mild, never meds.  Patient prefers no meds for this.   Metastatic malignant melanoma (HCC) 1989   Recurrence L upper arm 1993.  Then left back and LUL lung recurrence 1995-resected.  Brain lesion recurrence 1996-resected.  RLL lung lesion 1996--chemo & then resection.  Small bowel recurrence resected 1997, then mesenteric lesion resected 1998.  No sign of recurrence since 1998.  Released by onc/Dr. Magrinot as of 12/2015 (needs annual dermatologist exam and annual ophth exam).   Polyposis of colon     Prediabetes    A1c 5.9% 12/2014.  6.2% 09/2017. 6.3% 04/2019.   Secondary male hypogonadism    11/2023    Past Surgical History:  Procedure Laterality Date   APPENDECTOMY     BIOPSY  03/12/2023   Procedure: BIOPSY;  Surgeon: Brice Campi Albino Alu., MD;  Location: [facility];  Service: Gastroenterology;;   BRAIN SURGERY     Removal of melanoma met   COLON SURGERY  11/2020   Proximal hemicolectomy and lysis of adhesions and repair of incarcerated incisional hernia.  For colon ca resection.  iliocolonic anastomosis.   COLONOSCOPY  2009; 07/2016   11/27/20->adenoma   COLONOSCOPY WITH PROPOFOL  N/A 08/18/2016   Tubular adenoma.  Recall 3 yrs.  Procedure: COLONOSCOPY WITH PROPOFOL ;  Surgeon: Garrett Kallman, MD;  Location: [facility];  Service: Endoscopy;  Laterality: N/A;   ESOPHAGOGASTRODUODENOSCOPY N/A 03/12/2023   Procedure: ESOPHAGOGASTRODUODENOSCOPY (EGD);  Surgeon: Normie Becton., MD;  Location: [facility];  Service: Gastroenterology;  Laterality: N/A;   EUS N/A 03/12/2023   Procedure: UPPER ENDOSCOPIC ULTRASOUND (EUS) RADIAL;  Surgeon: Normie Becton., MD;  Location: [facility];  Service: Gastroenterology;  Laterality: N/A;   IR SINUS/FIST TUBE CHK-NON GI  10/08/2020   LAPAROSCOPIC APPENDECTOMY N/A 09/28/2020   Procedure: APPENDECTOMY LAPAROSCOPIC;  Surgeon: Sim Dryer, MD;  Location: [facility];  Service: General;  Laterality: N/A;   LUNG SURGERY     "              "          "        "  SMALL INTESTINE SURGERY  1998   x2 from melanoma    Outpatient Medications Prior to Visit  Medication Sig Dispense Refill   Cholecalciferol  (VITAMIN D3) 50 MCG (2000 UT) TABS Take 2,000 Units by mouth in the morning.     Omega-3 Fatty Acids (FISH OIL) 1000 MG CAPS Take by mouth. Taking 1200 mg     No facility-administered medications prior to visit.    Allergies  Allergen Reactions   Aspirin Nausea And Vomiting   Penicillins Other (See Comments)    Childhood  Has  patient had a PCN reaction causing immediate rash, facial/tongue/throat swelling, SOB or lightheadedness with hypotension: unknown Has patient had a PCN reaction causing severe rash involving mucus membranes or skin necrosis: {unkjnown Has patient had a PCN reaction that required hospitalization {no Has patient had a PCN reaction occurring within the last 10 years: no If all of the above answers are "NO", then may proceed with Cephalosporin use.    Review of Systems As per HPI  PE:    12/16/2023    8:46 AM 11/20/2023    8:51 AM 05/22/2023    9:03 AM  Vitals with BMI  Height 5\' 9"  5\' 9"    Weight 207 lbs 6 oz 203 lbs 207 lbs 13 oz  BMI 30.61 29.96   Systolic 127 109 045  Diastolic 71 69 79  Pulse 55 57 88     Physical Exam  Gen: Alert, well appearing.  Patient is oriented to person, place, time, and situation. AFFECT: pleasant, lucid thought and speech. No further exam today  LABS:  Last CBC Lab Results  Component Value Date   WBC 5.1 11/20/2023   HGB 14.9 11/20/2023   HCT 44.4 11/20/2023   MCV 88.6 11/20/2023   MCH 30.4 10/01/2021   RDW 13.2 11/20/2023   PLT 245.0 11/20/2023   Last metabolic panel Lab Results  Component Value Date   GLUCOSE 106 (H) 11/20/2023   NA 138 11/20/2023   K 4.2 11/20/2023   CL 103 11/20/2023   CO2 29 11/20/2023   BUN 17 11/20/2023   CREATININE 0.87 11/20/2023   GFR 92.67 11/20/2023   CALCIUM  8.8 11/20/2023   PHOS 4.2 12/01/2020   PROT 7.1 11/20/2023   ALBUMIN 4.4 11/20/2023   BILITOT 0.6 11/20/2023   ALKPHOS 43 11/20/2023   AST 17 11/20/2023   ALT 25 11/20/2023   ANIONGAP 7 10/01/2021   Last lipids Lab Results  Component Value Date   CHOL 200 11/20/2023   HDL 43.70 11/20/2023   LDLCALC 137 (H) 11/20/2023   LDLDIRECT 121.0 10/03/2021   TRIG 97.0 11/20/2023   CHOLHDL 5 11/20/2023   Last hemoglobin A1c Lab Results  Component Value Date   HGBA1C 6.3 11/20/2023   Last thyroid  functions Lab Results  Component Value Date    TSH 3.00 10/09/2022   Last vitamin D  Lab Results  Component Value Date   VD25OH 30.89 11/20/2023   Lab Results  Component Value Date   PSA 0.71 11/20/2023   PSA 0.99 10/09/2022   PSA 0.61 10/03/2021   Lab Results  Component Value Date   TESTOSTERONE  166.09 (L) 11/30/2023   IMPRESSION AND PLAN:  #1 secondary male hypogonadism. Start testosterone  200 mg IM every 2 weeks. Check testosterone  and LH 1 week after first injection. We will see him back in the office in about 5 weeks and we will recheck hemoglobin. Therapeutic expectations and side effect profile of medication discussed today.  Patient's questions answered.  2.  Hypercholesterolemia and prediabetes. He much prefers to take the approach of therapeutic lifestyle changes.  He is very motivated to do this.  An After Visit Summary was printed and given to the patient.  FOLLOW UP: Return in about 5 weeks (around 01/20/2024) for f/u low testost.  Signed:  Arletha Lady, MD           12/16/2023

## 2023-12-17 ENCOUNTER — Other Ambulatory Visit (HOSPITAL_COMMUNITY): Payer: Self-pay

## 2023-12-17 ENCOUNTER — Telehealth: Payer: Self-pay

## 2023-12-17 NOTE — Telephone Encounter (Signed)
 Pt advised PA approved, nurse visit scheduled

## 2023-12-17 NOTE — Telephone Encounter (Signed)
 Pharmacy Patient Advocate Encounter   Received notification from [facility] that Prior Authorization for Testosterone  Cypionate 200MG /ML intramuscular solution  has been APPROVED from 12/17/2023 to 12/16/2024 SEE OUTCOME FROM PLAN BELOW   Approved today  Request Reference Number: [id number]. TESTOST CYP INJ 200MG /ML is approved through 12/16/2024. Your patient may now fill this prescription and it will be covered. Effective Date: 12/17/2023 Authorization Expiration Date: 12/16/2024  Pt was advised PA approved

## 2023-12-17 NOTE — Telephone Encounter (Signed)
 Pharmacy Patient Advocate Encounter  Received notification from [facility] that Prior Authorization for Testosterone  Cypionate 200MG /ML intramuscular solution  has been APPROVED from 12/17/2023 to 12/16/2024 SEE OUTCOME FROM PLAN BELOW   Approved today  Request Reference Number: [id number]. TESTOST CYP INJ 200MG /ML is approved through 12/16/2024. Your patient may now fill this prescription and it will be covered. Effective Date: 12/17/2023 Authorization Expiration Date: 12/16/2024

## 2023-12-17 NOTE — Telephone Encounter (Signed)
 Pharmacy Patient Advocate Encounter   Received notification from Patient Advice Request messages that prior authorization for Testosterone  Cypionate 200MG /ML intramuscular solution is required/requested.   Insurance verification completed.   The patient is insured through [facility] .   Per test claim: PA required; PA submitted to above mentioned insurance via CoverMyMeds Key/confirmation #/EOC [facility] Status is pending

## 2023-12-18 ENCOUNTER — Telehealth: Payer: Self-pay

## 2023-12-18 ENCOUNTER — Ambulatory Visit (INDEPENDENT_AMBULATORY_CARE_PROVIDER_SITE_OTHER)

## 2023-12-18 DIAGNOSIS — R7989 Other specified abnormal findings of blood chemistry: Secondary | ICD-10-CM

## 2023-12-18 DIAGNOSIS — E291 Testicular hypofunction: Secondary | ICD-10-CM

## 2023-12-18 MED ORDER — TESTOSTERONE CYPIONATE 200 MG/ML IM SOLN
200.0000 mg | INTRAMUSCULAR | Status: DC
  Administered 2023-12-18 – 2024-05-20 (×12): 200 mg via INTRAMUSCULAR
  Administered 2024-06-03: 100 mg via INTRAMUSCULAR

## 2023-12-18 NOTE — Telephone Encounter (Signed)
 Patient came today for 1st testosterone  injection, repeat lab visit scheduled.   Please advise if pt needs to be on medication for estrogen blocker.

## 2023-12-18 NOTE — Telephone Encounter (Signed)
 MyChart message read by pt.

## 2023-12-18 NOTE — Progress Notes (Signed)
 Pt here for testosterone  injection per original order dated: 12/17/23 "Start testosterone  200 mg IM every 2 weeks"  Last testosterone  injection: 12/18/23  Last testosterone  level:  11/30/23  Testosterone  200mg  given IM right ventrogluteal, and pt tolerated injection well.  Next injection scheduled for: waiting for provider confirmation if continuing every 2 weeks

## 2023-12-18 NOTE — Telephone Encounter (Signed)
 No.  Just the testosterone  injection.

## 2023-12-23 ENCOUNTER — Ambulatory Visit (AMBULATORY_SURGERY_CENTER): Admitting: *Deleted

## 2023-12-23 ENCOUNTER — Encounter

## 2023-12-23 VITALS — Ht 69.0 in | Wt 197.0 lb

## 2023-12-23 DIAGNOSIS — Z860100 Personal history of colon polyps, unspecified: Secondary | ICD-10-CM

## 2023-12-23 NOTE — Progress Notes (Signed)
 Pt's name and DOB verified at the beginning of the pre-visit wit 2 identifiers  Pt denies any difficulty with ambulating,sitting, laying down or rolling side to side  Pt has no issues moving head neck or swallowing  No egg or soy allergy known to patient   No issues known to pt with past sedation with any surgeries or procedures  No FH of Malignant Hyperthermia  Pt is not on home 02   Pt is not on blood thinners   Pt denies issues with constipation   Pt is not on dialysis  Pt denise any abnormal heart rhythms   Pt denies any upcoming cardiac testing  Patient's chart reviewed by Rogena Class CNRA prior to pre-visit and patient appropriate for the [facility].  Pre-visit completed and red dot placed by patient's name on their procedure day (on provider's schedule).    Visit by phone  Pt states weight is 197 lb   IInstructions reviewed. Pt given  both [facility] main # and MD on call # prior to instructions.  Pt states understanding of instructions. Instructed pt to review instructions again prior to procedure and call main # given if has questions.. Pt states they will.   Instructed pt on where to find instructions on My Chart.

## 2023-12-24 ENCOUNTER — Encounter: Payer: Self-pay | Admitting: Gastroenterology

## 2023-12-25 ENCOUNTER — Other Ambulatory Visit (INDEPENDENT_AMBULATORY_CARE_PROVIDER_SITE_OTHER)

## 2023-12-25 DIAGNOSIS — R7989 Other specified abnormal findings of blood chemistry: Secondary | ICD-10-CM | POA: Diagnosis not present

## 2023-12-25 NOTE — Addendum Note (Signed)
 Addended by: Terris Fickle D on: 12/25/2023 08:17 AM   Modules accepted: Orders

## 2023-12-31 ENCOUNTER — Ambulatory Visit: Payer: Self-pay | Admitting: Family Medicine

## 2023-12-31 LAB — TESTOSTERONE: Testosterone: 496.37 ng/dL (ref 300.00–890.00)

## 2024-01-01 ENCOUNTER — Ambulatory Visit (INDEPENDENT_AMBULATORY_CARE_PROVIDER_SITE_OTHER)

## 2024-01-01 DIAGNOSIS — E291 Testicular hypofunction: Secondary | ICD-10-CM | POA: Diagnosis not present

## 2024-01-01 NOTE — Progress Notes (Signed)
 Pt here for biweekly testosterone injection per Dr Milinda Cave   Injection tolerated well.   Next injection scheduled for 2 weeks

## 2024-01-11 ENCOUNTER — Encounter: Payer: Self-pay | Admitting: Gastroenterology

## 2024-01-11 ENCOUNTER — Encounter: Admitting: Gastroenterology

## 2024-01-11 ENCOUNTER — Ambulatory Visit: Admitting: Gastroenterology

## 2024-01-11 VITALS — BP 124/80 | HR 60 | Temp 98.2°F | Resp 12 | Ht 69.0 in | Wt 197.0 lb

## 2024-01-11 DIAGNOSIS — D125 Benign neoplasm of sigmoid colon: Secondary | ICD-10-CM

## 2024-01-11 DIAGNOSIS — K635 Polyp of colon: Secondary | ICD-10-CM | POA: Diagnosis not present

## 2024-01-11 DIAGNOSIS — K648 Other hemorrhoids: Secondary | ICD-10-CM

## 2024-01-11 DIAGNOSIS — Z860101 Personal history of adenomatous and serrated colon polyps: Secondary | ICD-10-CM | POA: Diagnosis not present

## 2024-01-11 DIAGNOSIS — Z860100 Personal history of colon polyps, unspecified: Secondary | ICD-10-CM

## 2024-01-11 DIAGNOSIS — Z1211 Encounter for screening for malignant neoplasm of colon: Secondary | ICD-10-CM

## 2024-01-11 DIAGNOSIS — K5730 Diverticulosis of large intestine without perforation or abscess without bleeding: Secondary | ICD-10-CM | POA: Diagnosis not present

## 2024-01-11 DIAGNOSIS — Z980 Intestinal bypass and anastomosis status: Secondary | ICD-10-CM

## 2024-01-11 MED ORDER — SODIUM CHLORIDE 0.9 % IV SOLN: 500.0000 mL | INTRAVENOUS | Status: DC

## 2024-01-11 NOTE — Progress Notes (Signed)
 Report given to PACU, vss

## 2024-01-11 NOTE — Op Note (Signed)
  [facility] Patient Name: Donyea Beverlin Procedure Date: 01/11/2024 1:03 PM MRN: [id number] Endoscopist: Victory L. Legrand , MD, [id number] Age: 63 Referring MD:  Date of Birth: [birth year] Gender: Male Account #: [id number] Procedure:                Colonoscopy Indications:              Personal history of colonic polyps                           8 SubCM TA and SSP May 2022 (Dr. 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 Gladis Louder) Medicines:                Monitored Anesthesia Care Procedure:                Pre-Anesthesia Assessment:                           - Prior to the procedure, a History and Physical                            was performed, and patient medications and                            allergies were reviewed. The patient's tolerance of                            previous anesthesia was also reviewed. The risks                            and benefits of the procedure and the sedation                            options and risks were discussed with the patient.                            All questions were answered, and informed consent                            was obtained. Prior Anticoagulants: The patient has                            taken no anticoagulant or antiplatelet agents. ASA                            Grade Assessment: II - A patient with mild systemic  disease. After reviewing the risks and benefits,                            the patient was deemed in satisfactory condition to                            undergo the procedure.                           After obtaining informed consent, the colonoscope                            was passed under direct vision. Throughout the                            procedure, the patient's blood pressure, pulse, and                             oxygen saturations were monitored continuously. The                            Colonoscope was introduced through the anus and                            advanced to the the ileocolonic anastomosis and                            neo-TI. The colonoscopy was performed without                            difficulty. The patient tolerated the procedure                            well. The quality of the bowel preparation was                            good. The rectum and ileo-colonic anastomosis were                            photographed. Scope In: 1:22:27 PM Scope Out: 1:34:40 PM Scope Withdrawal Time: 0 hours 9 minutes 47 seconds  Total Procedure Duration: 0 hours 12 minutes 13 seconds  Findings:                 The perianal and digital rectal examinations were                            normal.                           There was evidence of a prior side-to-side                            ileo-colonic anastomosis in the proximal transverse  colon. This was patent and was characterized by                            healthy appearing mucosa.                           Multiple diverticula were found in the left colon.                           A diminutive polyp was found in the sigmoid colon.                            The polyp was flat. The polyp was removed with a                            cold snare. Resection and retrieval were complete.                           Internal hemorrhoids were found.                           The exam was otherwise without abnormality on                            direct and retroflexion views. Complications:            No immediate complications. Estimated Blood Loss:     Estimated blood loss was minimal. Impression:               - Patent side-to-side ileo-colonic anastomosis,                            characterized by healthy appearing mucosa.                           - Diverticulosis in the left colon.                            - One diminutive polyp in the sigmoid colon,                            removed with a cold snare. Resected and retrieved.                           - Internal hemorrhoids.                           - The examination was otherwise normal on direct                            and retroflexion views. Recommendation:           - Patient has a contact number available for                            emergencies. The signs and symptoms of potential  delayed complications were discussed with the                            patient. Return to normal activities tomorrow.                            Written discharge instructions were provided to the                            patient.                           - Resume previous diet.                           - Continue present medications.                           - Await pathology results.                           - Repeat colonoscopy in 5 years for surveillance. Kyrese Gartman L. Legrand, MD 01/11/2024 1:46:09 PM This report has been signed electronically.

## 2024-01-11 NOTE — Progress Notes (Signed)
 History and Physical:  This patient presents for endoscopic testing for: Encounter Diagnosis  Name Primary?   History of colonic polyps Yes    Surveillance colonoscopy for Hx colon polyps Multiple TA and SSP May 2022, just before right hemicolectomy for appendiceal goblet call adenoCA. Had TA and SSP polyps in 2018 with Dr EMERSON Louder Patient denies chronic abdominal pain, rectal bleeding, constipation or diarrhea.   Patient is otherwise without complaints or active issues today.   Past Medical History: Past Medical History:  Diagnosis Date   Arthritis    lower back   Asymmetrical sensorineural hearing loss    L>>R: ENT eval 04/2018.  Pt declined MRI brain at that time.   Cancer of appendix (HCC)    Colon adenocarcinoma (HCC) 1995   goblet cell adenocarcinoma of colon, dx'd when he got acute appendicitis w/perf-->path showed adenocarcinoma with positive margins.  CT chest neg for metastatic dz, CEA neg. R hemicolectomy with iliocolonic anastomosis 11/2020.   Coronary artery calcification    and aortic atherosclerosis.  Noted on 09/2021 CT that was done for cancer surveillance   Hepatic steatosis    noted on CT 09/2021 for cancer surveillance   History of adenomatous polyp of colon 07/2016   History of vitamin D  deficiency    still present as of 09/2017.  High dose vit D started 10/16/17.  Increased dose to 50K twice per week 02/18/18.   Hyperlipidemia    Mild, never meds.  Patient prefers no meds for this.   Metastatic malignant melanoma (HCC) 1989   Recurrence L upper arm 1993.  Then left back and LUL lung recurrence 1995-resected.  Brain lesion recurrence 1996-resected.  RLL lung lesion 1996--chemo & then resection.  Small bowel recurrence resected 1997, then mesenteric lesion resected 1998.  No sign of recurrence since 1998.  Released by onc/Dr. Magrinot as of 12/2015 (needs annual dermatologist exam and annual ophth exam).   Polyposis of colon    Prediabetes    A1c 5.9% 12/2014.   6.2% 09/2017. 6.3% 04/2019.   Secondary male hypogonadism    11/2023     Past Surgical History: Past Surgical History:  Procedure Laterality Date   APPENDECTOMY     BIOPSY  03/12/2023   Procedure: BIOPSY;  Surgeon: Wilhelmenia Aloha Raddle., MD;  Location: [facility];  Service: Gastroenterology;;   BRAIN SURGERY     Removal of melanoma met   COLON SURGERY  11/2020   Proximal hemicolectomy and lysis of adhesions and repair of incarcerated incisional hernia.  For colon ca resection.  iliocolonic anastomosis.   COLONOSCOPY  2009; 07/2016   11/27/20->adenoma   COLONOSCOPY WITH PROPOFOL  N/A 08/18/2016   Tubular adenoma.  Recall 3 yrs.  Procedure: COLONOSCOPY WITH PROPOFOL ;  Surgeon: Gladis MARLA Louder, MD;  Location: [facility];  Service: Endoscopy;  Laterality: N/A;   ESOPHAGOGASTRODUODENOSCOPY N/A 03/12/2023   Procedure: ESOPHAGOGASTRODUODENOSCOPY (EGD);  Surgeon: Wilhelmenia Aloha Raddle., MD;  Location: [facility];  Service: Gastroenterology;  Laterality: N/A;   EUS N/A 03/12/2023   Procedure: UPPER ENDOSCOPIC ULTRASOUND (EUS) RADIAL;  Surgeon: Wilhelmenia Aloha Raddle., MD;  Location: [facility];  Service: Gastroenterology;  Laterality: N/A;   IR SINUS/FIST TUBE CHK-NON GI  10/08/2020   LAPAROSCOPIC APPENDECTOMY N/A 09/28/2020   Procedure: APPENDECTOMY LAPAROSCOPIC;  Surgeon: Vanderbilt Ned, MD;  Location: [facility];  Service: General;  Laterality: N/A;   LUNG SURGERY  SMALL INTESTINE SURGERY  1998   x2 from melanoma    Allergies: Allergies  Allergen Reactions   Aspirin Nausea And Vomiting   Penicillins Other (See Comments)    Childhood  Has patient had a PCN reaction causing immediate rash, facial/tongue/throat swelling, SOB or lightheadedness with hypotension: unknown Has patient had a PCN reaction causing severe rash involving mucus membranes or skin necrosis: {unkjnown Has patient had a PCN reaction that required hospitalization {no Has  patient had a PCN reaction occurring within the last 10 years: no If all of the above answers are NO, then may proceed with Cephalosporin use.    Outpatient Meds: Current Outpatient Medications  Medication Sig Dispense Refill   Cholecalciferol  (VITAMIN D3) 50 MCG (2000 UT) TABS Take 2,000 Units by mouth in the morning.     Omega-3 Fatty Acids (FISH OIL) 1000 MG CAPS Take by mouth. Taking 1200 mg     testosterone  cypionate (DEPOTESTOSTERONE CYPIONATE) 200 MG/ML injection 1 ml IM q 2 wks 6 mL 0   Current Facility-Administered Medications  Medication Dose Route Frequency Provider Last Rate Last Admin   0.9 %  sodium chloride  infusion  500 mL Intravenous Continuous Danis, Victory CROME III, MD       testosterone  cypionate (DEPOTESTOSTERONE CYPIONATE) injection 200 mg  200 mg Intramuscular Q14 Days McGowen, Aleene DEL, MD   200 mg at 01/01/24 0944      ___________________________________________________________________ Objective   Exam:  BP (!) 141/86   Pulse (!) 59   Temp 98.2 F (36.8 C)   Resp 18   Ht 5' 9 (1.753 m)   Wt 197 lb (89.4 kg)   SpO2 97%   BMI 29.09 kg/m   CV: regular , S1/S2 Resp: clear to auscultation bilaterally, normal RR and effort noted GI: soft, no tenderness, with active bowel sounds.   Assessment: Encounter Diagnosis  Name Primary?   History of colonic polyps Yes     Plan: Colonoscopy   The benefits and risks of the planned procedure(s) were described in detail with the patient or (when appropriate) their health care proxy.  Risks were outlined as including, but not limited to, bleeding, infection, perforation, adverse medication reaction leading to cardiac or pulmonary decompensation, pancreatitis (if ERCP).  The limitation of incomplete mucosal visualization was also discussed.  No guarantees or warranties were given.  The patient is appropriate for an endoscopic procedure in the ambulatory setting.   - Victory Brand, MD

## 2024-01-11 NOTE — Patient Instructions (Addendum)
-   1 polyp removed and sent to pathology. Diverticulosis.  - Handouts given to patient. - Resume previous diet - Continue present medications. - Await pathology results. - Repeat colonoscopy in 5 years for surveillance.  YOU HAD AN ENDOSCOPIC PROCEDURE TODAY AT THE [facility]:   Refer to the procedure report that was given to you for any specific questions about what was found during the examination.  If the procedure report does not answer your questions, please call your gastroenterologist to clarify.  If you requested that your care partner not be given the details of your procedure findings, then the procedure report has been included in a sealed envelope for you to review at your convenience later.  YOU SHOULD EXPECT: Some feelings of bloating in the abdomen. Passage of more gas than usual.  Walking can help get rid of the air that was put into your GI tract during the procedure and reduce the bloating. If you had a lower endoscopy (such as a colonoscopy or flexible sigmoidoscopy) you may notice spotting of blood in your stool or on the toilet paper. If you underwent a bowel prep for your procedure, you may not have a normal bowel movement for a few days.  Please Note:  You might notice some irritation and congestion in your nose or some drainage.  This is from the oxygen used during your procedure.  There is no need for concern and it should clear up in a day or so.  SYMPTOMS TO REPORT IMMEDIATELY:  Following lower endoscopy (colonoscopy or flexible sigmoidoscopy):  Excessive amounts of blood in the stool  Significant tenderness or worsening of abdominal pains  Swelling of the abdomen that is new, acute  Fever of 100F or higher   For urgent or emergent issues, a gastroenterologist can be reached at any hour by calling [phone]. Do not use MyChart messaging for urgent concerns.    DIET:  We do recommend a small meal at first, but then you may proceed to your regular  diet.  Drink plenty of fluids but you should avoid alcoholic beverages for 24 hours.  ACTIVITY:  You should plan to take it easy for the rest of today and you should NOT DRIVE or use heavy machinery until tomorrow (because of the sedation medicines used during the test).    FOLLOW UP: Our staff will call the number listed on your records the next business day following your procedure.  We will call around 7:15- 8:00 am to check on you and address any questions or concerns that you may have regarding the information given to you following your procedure. If we do not reach you, we will leave a message.     If any biopsies were taken you will be contacted by phone or by letter within the next 1-3 weeks.  Please call us  at [phone] if you have not heard about the biopsies in 3 weeks.    SIGNATURES/CONFIDENTIALITY: You and/or your care partner have signed paperwork which will be entered into your electronic medical record.  These signatures attest to the fact that that the information above on your After Visit Summary has been reviewed and is understood.  Full responsibility of the confidentiality of this discharge information lies with you and/or your care-partner.

## 2024-01-11 NOTE — Progress Notes (Signed)
 Patient would like a work note before departure from recovery.

## 2024-01-12 ENCOUNTER — Telehealth: Payer: Self-pay | Admitting: *Deleted

## 2024-01-12 NOTE — Telephone Encounter (Signed)
  Follow up Call-     01/11/2024   12:47 PM  Call back number  Post procedure Call Back phone  # [phone]  Permission to leave phone message Yes     Patient questions:  Do you have a fever, pain , or abdominal swelling? No. Pain Score  0 *  Have you tolerated food without any problems? Yes.    Have you been able to return to your normal activities? Yes.    Do you have any questions about your discharge instructions: Diet   No. Medications  No. Follow up visit  No.  Do you have questions or concerns about your Care? No.  Actions: * If pain score is 4 or above: No action needed, pain <4.

## 2024-01-14 ENCOUNTER — Ambulatory Visit: Payer: Self-pay | Admitting: Gastroenterology

## 2024-01-14 LAB — SURGICAL PATHOLOGY

## 2024-01-15 ENCOUNTER — Ambulatory Visit (INDEPENDENT_AMBULATORY_CARE_PROVIDER_SITE_OTHER)

## 2024-01-15 DIAGNOSIS — E291 Testicular hypofunction: Secondary | ICD-10-CM | POA: Diagnosis not present

## 2024-01-15 NOTE — Progress Notes (Signed)
 Pt here for biweekly testosterone injection per Dr Milinda Cave   Injection tolerated well.   Next injection scheduled for 2 weeks

## 2024-01-29 ENCOUNTER — Ambulatory Visit (INDEPENDENT_AMBULATORY_CARE_PROVIDER_SITE_OTHER)

## 2024-01-29 DIAGNOSIS — E291 Testicular hypofunction: Secondary | ICD-10-CM | POA: Diagnosis not present

## 2024-01-29 NOTE — Progress Notes (Signed)
 Pt here for biweekly testosterone injection per Dr Milinda Cave   Injection tolerated well.   Next injection scheduled for 2 weeks

## 2024-02-11 ENCOUNTER — Telehealth: Payer: Self-pay | Admitting: Oncology

## 2024-02-11 NOTE — Telephone Encounter (Signed)
 Pt called to reschedule appt until October 7th.

## 2024-02-12 ENCOUNTER — Ambulatory Visit

## 2024-02-12 DIAGNOSIS — E291 Testicular hypofunction: Secondary | ICD-10-CM

## 2024-02-12 NOTE — Progress Notes (Signed)
 Pt here for biweekly testosterone  injection per Dr McGowen  Injection tolerated well.  Next injection scheduled for 02/26/24

## 2024-02-18 ENCOUNTER — Ambulatory Visit: Payer: 59 | Admitting: Dermatology

## 2024-02-24 ENCOUNTER — Ambulatory Visit: Admitting: Dermatology

## 2024-02-24 ENCOUNTER — Encounter: Payer: Self-pay | Admitting: Dermatology

## 2024-02-24 VITALS — BP 122/76

## 2024-02-24 DIAGNOSIS — L570 Actinic keratosis: Secondary | ICD-10-CM

## 2024-02-24 DIAGNOSIS — L814 Other melanin hyperpigmentation: Secondary | ICD-10-CM

## 2024-02-24 DIAGNOSIS — L821 Other seborrheic keratosis: Secondary | ICD-10-CM

## 2024-02-24 DIAGNOSIS — D1801 Hemangioma of skin and subcutaneous tissue: Secondary | ICD-10-CM | POA: Diagnosis not present

## 2024-02-24 DIAGNOSIS — L578 Other skin changes due to chronic exposure to nonionizing radiation: Secondary | ICD-10-CM

## 2024-02-24 DIAGNOSIS — Z1283 Encounter for screening for malignant neoplasm of skin: Secondary | ICD-10-CM | POA: Diagnosis not present

## 2024-02-24 DIAGNOSIS — W908XXA Exposure to other nonionizing radiation, initial encounter: Secondary | ICD-10-CM

## 2024-02-24 DIAGNOSIS — D229 Melanocytic nevi, unspecified: Secondary | ICD-10-CM

## 2024-02-24 NOTE — Progress Notes (Signed)
 Follow-Up Visit   Subjective  Sean [name] is a 63 y.o. 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 He gets yearly eye exams and skin checks. He also has a history of goblet cell adenocarcinoma. He had a CT scan last month. He has a rough spot of his left temple that he just noticed a couple months ago.  The patient presents for Total-Body Skin Exam (TBSE) for skin cancer screening and mole check. The patient has spots, moles and lesions to be evaluated, some may be new or changing and the patient may have concern these could be cancer.    The following portions of the chart were reviewed this encounter and updated as appropriate: medications, allergies, medical history  Review of Systems:  No other skin or systemic complaints except as noted in HPI or Assessment and Plan.  Objective  Well appearing patient in no apparent distress; mood and affect are within normal limits.  A full examination was performed including scalp, head, eyes, ears, nose, lips, neck, chest, axillae, abdomen, back, buttocks, bilateral upper extremities, bilateral lower extremities, hands, feet, fingers, toes, fingernails, and toenails. All findings within normal limits unless otherwise noted below.   Relevant physical exam findings are noted in the Assessment and Plan.  Nose x 2, left malar cheek x 1 (3) Erythematous thin papules/macules with gritty scale.   Assessment & Plan   SKIN CANCER SCREENING PERFORMED TODAY.  ACTINIC DAMAGE - Chronic condition, secondary to cumulative UV/sun exposure - diffuse scaly erythematous macules with underlying dyspigmentation - Recommend daily broad spectrum sunscreen SPF 30+ to sun-exposed areas, reapply every 2 hours as needed.  - Staying in  the shade or wearing long sleeves, sun glasses (UVA+UVB protection) and wide brim hats (4-inch brim around the entire circumference of the hat) are also recommended for sun protection.  - Call for new or changing lesions.  LENTIGINES, SEBORRHEIC KERATOSES, HEMANGIOMAS - Benign normal skin lesions - Benign-appearing - Call for any changes  MELANOCYTIC NEVI - Tan-brown and/or pink-flesh-colored symmetric macules and papules - Benign appearing on exam today - Observation - Call clinic for new or changing moles - Recommend daily use of broad spectrum spf 30+ sunscreen to sun-exposed areas.   HISTORY OF MELANOMA WITH HISTORY OF METASTASIS - No evidence of recurrence today - No lymphadenopathy - Recommend regular full body skin exams - Recommend daily broad spectrum sunscreen SPF 30+ to sun-exposed areas, reapply every 2 hours as needed.  - Call if any new or changing lesions are noted between office visits   AK (ACTINIC KERATOSIS) (3) Nose x 2, left malar cheek x 1 (3) Destruction of lesion - Nose x 2, left malar cheek x 1 (3) Complexity: simple   Destruction method: cryotherapy   Informed consent: discussed and consent obtained   Timeout:  patient name, date of birth, surgical site, and procedure verified Lesion destroyed using liquid nitrogen: Yes   Region frozen until ice ball extended beyond lesion: Yes   Outcome: patient tolerated procedure well with no complications   Post-procedure details: wound care instructions given     SEBORRHEIC KERATOSIS - Stuck-on, waxy, tan-brown papule of left temple - Benign-appearing - Discussed benign etiology and prognosis. - Observe - Call for any changes   Return in about  1 year (around 02/23/2025) for TBSE History of Melanoma.  I, Roseline Hutchinson, CMA, am acting as scribe for [employer], DO .   Documentation: I have reviewed the above documentation for accuracy and completeness, and I agree with the above.  Delon Lenis, DO

## 2024-02-24 NOTE — Patient Instructions (Addendum)
 Date: Wed Feb 24 2024  Hello Sean [name],  Thank you for visiting today. Here is a summary of the key instructions:  - Skin Care:   - Apply Vaseline to the rash on your nose if it bothers you  - Sun Protection:   - Avoid direct sun exposure   - Wear hats when outdoors   - Use sunscreen when in direct sunlight  - Treatment Areas:   - Two spots on your nose were treated with liquid nitrogen cryotherapy today  - Follow-up:   - Contact the office if you notice any new or changing moles  Please reach out if you have any questions or concerns.  Warm regards,  Dr. Delon Lenis Dermatology     Cryotherapy Aftercare  Wash gently with soap and water everyday.   Apply Vaseline and Band-Aid daily until healed.     Important Information  Due to recent changes in healthcare laws, you may see results of your pathology and/or laboratory studies on MyChart before the doctors have had a chance to review them. We understand that in some cases there may be results that are confusing or concerning to you. Please understand that not all results are received at the same time and often the doctors may need to interpret multiple results in order to provide you with the best plan of care or course of treatment. Therefore, we ask that you please give us  2 business days to thoroughly review all your results before contacting the office for clarification. Should we see a critical lab result, you will be contacted sooner.   If You Need Anything After Your Visit  If you have any questions or concerns for your doctor, please call our main line at [phone] If no one answers, please leave a voicemail as directed and we will return your call as soon as possible. Messages left after 4 pm will be answered the following business day.   You may also send us  a message via MyChart. We typically respond to MyChart messages within 1-2 business days.  For prescription refills, please ask your pharmacy to  contact our office. Our fax number is [phone].  If you have an urgent issue when the clinic is closed that cannot wait until the next business day, you can page your doctor at the number below.    Please note that while we do our best to be available for urgent issues outside of office hours, we are not available 24/7.   If you have an urgent issue and are unable to reach us , you may choose to seek medical care at your doctor's office, retail clinic, urgent care center, or emergency room.  If you have a medical emergency, please immediately call 911 or go to the emergency department. In the event of inclement weather, please call our main line at [phone] for an update on the status of any delays or closures.  Dermatology Medication Tips: Please keep the boxes that topical medications come in in order to help keep track of the instructions about where and how to use these. Pharmacies typically print the medication instructions only on the boxes and not directly on the medication tubes.   If your medication is too expensive, please contact our office at [phone] or send us  a message through MyChart.   We are unable to tell what your co-pay for medications will be in advance as this is different depending on your insurance coverage. However, we may be able to find a substitute medication  at lower cost or fill out paperwork to get insurance to cover a needed medication.   If a prior authorization is required to get your medication covered by your insurance company, please allow us  1-2 business days to complete this process.  Drug prices often vary depending on where the prescription is filled and some pharmacies may offer cheaper prices.  The website www.goodrx.com contains coupons for medications through different pharmacies. The prices here do not account for what the cost may be with help from insurance (it may be cheaper with your insurance), but the website can give you the price  if you did not use any insurance.  - You can print the associated coupon and take it with your prescription to the pharmacy.  - You may also stop by our office during regular business hours and pick up a GoodRx coupon card.  - If you need your prescription sent electronically to a different pharmacy, notify our office through [facility] or by phone at [phone]

## 2024-02-25 ENCOUNTER — Ambulatory Visit: Payer: 59 | Admitting: Oncology

## 2024-02-26 ENCOUNTER — Ambulatory Visit

## 2024-02-26 DIAGNOSIS — E291 Testicular hypofunction: Secondary | ICD-10-CM | POA: Diagnosis not present

## 2024-02-26 NOTE — Progress Notes (Signed)
 Pt here for biweekly testosterone  injection per Dr Candise  Injection tolerated well. Pt will need refill sent to pharmacy.  Next injection scheduled for 2 weeks

## 2024-03-09 ENCOUNTER — Other Ambulatory Visit: Payer: Self-pay | Admitting: Family Medicine

## 2024-03-09 NOTE — Telephone Encounter (Signed)
 Refill requested for Test[facility] [place]. Last OV 5/28 Next OV 11/3

## 2024-03-09 NOTE — Telephone Encounter (Signed)
 Refill sent today, this is a duplicate request

## 2024-03-09 NOTE — Telephone Encounter (Signed)
 Copied from CRM #[id number]. Topic: Clinical - Medication Refill >> Mar 09, 2024 10:23 AM Ismael A wrote: Medication: testosterone  cypionate (DEPOTESTOSTERONE CYPIONATE) injection 200 mg  Has the patient contacted their pharmacy? Yes (Agent: If no, request that the patient contact the pharmacy for the refill. If patient does not wish to contact the pharmacy document the reason why and proceed with request.) (Agent: If yes, when and what did the pharmacy advise?)  This is the patient's preferred pharmacy:  [id number] [place], [place] - [address] [place] [address] Phone: [phone] Fax: [phone]  Is this the correct pharmacy for this prescription? Yes If no, delete pharmacy and type the correct one.   Has the prescription been filled recently? No  Is the patient out of the medication? Yes - pt has appt on Friday to get injection for testosterone  as well but patient is out of it   Has the patient been seen for an appointment in the last year OR does the patient have an upcoming appointment? Yes  Can we respond through MyChart? No  Agent: Please be advised that Rx refills may take up to 3 business days. We ask that you follow-up with your pharmacy.

## 2024-03-09 NOTE — Telephone Encounter (Signed)
 12/16/23 6 mL/0 RF     Copied from CRM #[id number]. Topic: Clinical - Medication Refill >> Mar 09, 2024 10:23 AM Ismael A wrote: Medication: testosterone  cypionate (DEPOTESTOSTERONE CYPIONATE) injection 200 mg  Has the patient contacted their pharmacy? Yes (Agent: If no, request that the patient contact the pharmacy for the refill. If patient does not wish to contact the pharmacy document the reason why and proceed with request.) (Agent: If yes, when and what did the pharmacy advise?)  This is the patient's preferred pharmacy:  [id number] [place], [place] - [address] [place] [address] Phone: [phone] Fax: [phone]  Is this the correct pharmacy for this prescription? Yes If no, delete pharmacy and type the correct one.   Has the prescription been filled recently? No  Is the patient out of the medication? Yes - pt has appt on Friday to get injection for testosterone  as well but patient is out of it   Has the patient been seen for an appointment in the last year OR does the patient have an upcoming appointment? Yes  Can we respond through MyChart? No  Agent: Please be advised that Rx refills may take up to 3 business days. We ask that you follow-up with your pharmacy.

## 2024-03-11 ENCOUNTER — Ambulatory Visit (INDEPENDENT_AMBULATORY_CARE_PROVIDER_SITE_OTHER)

## 2024-03-11 DIAGNOSIS — E291 Testicular hypofunction: Secondary | ICD-10-CM | POA: Diagnosis not present

## 2024-03-11 NOTE — Progress Notes (Signed)
 Pt here for biweekly testosterone  injection per Dr McGowen  Injection tolerated well.  Next injection scheduled for 9/5

## 2024-03-25 ENCOUNTER — Ambulatory Visit (INDEPENDENT_AMBULATORY_CARE_PROVIDER_SITE_OTHER)

## 2024-03-25 DIAGNOSIS — E291 Testicular hypofunction: Secondary | ICD-10-CM

## 2024-03-25 NOTE — Progress Notes (Signed)
 Pt here for biweekly testosterone  injection per Dr McGowen   Injection tolerated well.   Next injection scheduled for 9/19

## 2024-04-05 ENCOUNTER — Other Ambulatory Visit: Payer: Self-pay | Admitting: Family Medicine

## 2024-04-05 MED ORDER — TESTOSTERONE CYPIONATE 200 MG/ML IM SOLN: INTRAMUSCULAR | 3 refills | Status: DC

## 2024-04-05 NOTE — Telephone Encounter (Signed)
 Copied from CRM [id number]. Topic: Clinical - Medication Refill >> Apr 05, 2024  8:24 AM Revonda D wrote: Medication: testosterone  cypionate (DEPOTESTOSTERONE CYPIONATE) 200 MG/ML injection  Has the patient contacted their pharmacy? Yes (Agent: If no, request that the patient contact the pharmacy for the refill. If patient does not wish to contact the pharmacy document the reason why and proceed with request.) (Agent: If yes, when and what did the pharmacy advise?)  This is the patient's preferred pharmacy:  [id number] [place], [place] - [address] [place] [address] [place] [address] [place] [address] Phone: [phone] Fax: [phone]  Is this the correct pharmacy for this prescription? Yes If no, delete pharmacy and type the correct one.   Has the prescription been filled recently? No  Is the patient out of the medication? Yes  Has the patient been seen for an appointment in the last year OR does the patient have an upcoming appointment? Yes  Can we respond through MyChart? Yes  Agent: Please be advised that Rx refills may take up to 3 business days. We ask that you follow-up with your pharmacy.

## 2024-04-05 NOTE — Telephone Encounter (Signed)
 RF request for testosterone   LOV: 04/08/24 nurse visit Next ov: 05/23/24 PCP Last written: 03/09/24 (39ml,5)

## 2024-04-05 NOTE — Telephone Encounter (Signed)
Pt advised refill sent. °

## 2024-04-08 ENCOUNTER — Ambulatory Visit

## 2024-04-08 DIAGNOSIS — E291 Testicular hypofunction: Secondary | ICD-10-CM

## 2024-04-08 NOTE — Progress Notes (Signed)
 Pt here for biweekly testosterone  injection per Dr McGowen  Injection tolerated well.  Next injection scheduled for 10/3

## 2024-04-22 ENCOUNTER — Other Ambulatory Visit: Payer: Self-pay

## 2024-04-22 ENCOUNTER — Ambulatory Visit

## 2024-04-22 DIAGNOSIS — E291 Testicular hypofunction: Secondary | ICD-10-CM

## 2024-04-22 MED ORDER — TESTOSTERONE CYPIONATE 200 MG/ML IM SOLN: INTRAMUSCULAR | 3 refills | Status: AC

## 2024-04-22 NOTE — Progress Notes (Signed)
 Pt here for biweekly testosterone injection per Dr Milinda Cave   Injection tolerated well.   Next injection scheduled for 2 weeks

## 2024-04-22 NOTE — Telephone Encounter (Signed)
 Pt in for testosterone  injection today and will need a refill sent to pharmacy before his next injection. [facility] [place]

## 2024-04-26 ENCOUNTER — Inpatient Hospital Stay: Attending: Oncology | Admitting: Oncology

## 2024-04-26 VITALS — BP 120/74 | HR 75 | Temp 98.1°F | Resp 18 | Ht 69.0 in | Wt 203.9 lb

## 2024-04-26 DIAGNOSIS — Z79890 Hormone replacement therapy (postmenopausal): Secondary | ICD-10-CM | POA: Diagnosis not present

## 2024-04-26 DIAGNOSIS — Z85820 Personal history of malignant melanoma of skin: Secondary | ICD-10-CM | POA: Insufficient documentation

## 2024-04-26 DIAGNOSIS — C181 Malignant neoplasm of appendix: Secondary | ICD-10-CM | POA: Diagnosis not present

## 2024-04-26 DIAGNOSIS — Z8589 Personal history of malignant neoplasm of other organs and systems: Secondary | ICD-10-CM | POA: Insufficient documentation

## 2024-04-26 NOTE — Progress Notes (Signed)
 [facility] OFFICE PROGRESS NOTE   Diagnosis: Goblet carcinoma of the appendix, melanoma  INTERVAL HISTORY:   Mr. Casale was last seen in the oncology clinic in July 2024.  He reports feeling well.  He has been followed by Dr. Claryce.  Mesenteric lymph nodes and the right abdominal implant have decreased in size.  He is scheduled for restaging CTs in January. Mr. Hartt feels well.  No abdominal pain.  He is working.  He reports improvement in his energy level after starting testosterone  replacement therapy.  Objective:  Vital signs in last 24 hours:  Blood pressure 120/74, pulse 75, temperature 98.1 F (36.7 C), resp. rate 18, height 5' 9 (1.753 m), weight 203 lb 14.4 oz (92.5 kg), SpO2 100%.  Lymphatics: No cervical, supraclavicular, axillary, or inguinal nodes Resp: Lungs clear bilaterally Cardio: Regular rate and rhythm with an occasional pause or premature beat GI: No hepatosplenomegaly, no mass, nontender Vascular: No leg edema  Skin: Left forearm, back, and chest scars without evidence of recurrent tumor  Lab Results:  Lab Results  Component Value Date   WBC 5.1 11/20/2023   HGB 14.9 11/20/2023   HCT 44.4 11/20/2023   MCV 88.6 11/20/2023   PLT 245.0 11/20/2023   NEUTROABS 2.9 11/20/2023    CMP  Lab Results  Component Value Date   NA 138 11/20/2023   K 4.2 11/20/2023   CL 103 11/20/2023   CO2 29 11/20/2023   GLUCOSE 106 (H) 11/20/2023   BUN 17 11/20/2023   CREATININE 0.87 11/20/2023   CALCIUM  8.8 11/20/2023   PROT 7.1 11/20/2023   ALBUMIN 4.4 11/20/2023   AST 17 11/20/2023   ALT 25 11/20/2023   ALKPHOS 43 11/20/2023   BILITOT 0.6 11/20/2023   GFRNONAA >60 10/01/2021     Medications: I have reviewed the patient's current medications.   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  Grade 1.  Proximal resection margin positive.  Focal disruption in the mid segment noted CT abdomen/pelvis 09/28/2020- acute uncomplicated appendicitis CT chest 10/03/2020- no suspicious pulmonary nodules, no evidence of metastatic disease Colonoscopy 11/27/2020-normal appendiceal orifice, multiple polyps removed from the colon Right colectomy 11/28/2020- no evidence of metastatic disease, no residual goblet cell carcinoma, 23 negative lymph nodes Cycle 1 Xeloda  01/28/2021 Cycle 2 Xeloda  02/18/2021 Cycle 3 Xeloda  03/11/2021 Cycle 4 Xeloda  04/01/2021 Cycle 5 Xeloda  04/22/2021 Cycle 6 Xeloda  05/13/2021 Cycle 7 Xeloda  06/03/2021 Cycle 8 Xeloda  06/24/2021 CTs 10/01/2021-no evidence of recurrent disease CTs 09/30/2022-interval development of a fluid attenuating peritoneal deposit within the lower abdomen, increasing size and number of multiple mesenteric and ileocolic nodes.  Stable right lower lobe pulmonary nodule. PET 10/24/2022 24-0.9 cm level 2 lymph node with mild hypermetabolism, mild hypermetabolism associated with an anterior upper abdomen nodule, right lower quadrant nodule and cystic lesion in the right iliac fossa-new from 10/01/2021, mildly hypermetabolic small left level 2 cervical node-nonspecific Post appendectomy fluid collection- drained by interventional radiology 10/03/2020, negative cytology CT abdomen/pelvis 01/28/2023-interval decrease in some of the right abdomen mesenteric nodes, small implant at the right lower quadrant ileum decreased in size, no new site of metastatic disease EUS 03/12/2023: No appearing nodes in the celiac and porta hepatis, no abnormality in the region of the distal stomach, gallbladder and proximal duodenum PET 07/09/2023: Punctate focus of uptake in the right paracentral mesentery without CT correlate, no abnormal uptake in  the previously seen mesenteric nodes-stable compared to 01/27/2023, no uptake in the region of the implant at the right lower  quadrant ileum, previously seen soft tissue nodule anterior to the body of the pancreas and posterior of the transverse colon has resolved, previously seen soft tissue nodule the right paracolic gutter is less conspicuous CT abdomen/pelvis 01/27/2024: No pathologically enlarged lymph nodes, low-attenuation mesenteric lesion not apparent, no evidence of progressive disease Colonoscopy 11/27/2020, sessile serrated polyps, tubular adenomas, and hyperplastic polyps Colonoscopy 01/11/2024: Sigmoid polyp-hyperplastic polyp Melanoma, left arm November 1989, Clark's level 3, Breslow depth 1.0 Recurrent left upper arm February 1993-excised Recurrence left back, left lower lung (removed in 1995), solitary brain lesion resected in September 1996, status post treatment with the [facility] regimen, measurable disease in the right lung-chemotherapy ineffective, right lung lesion removed November 1996 Resection of small bowel lesion in February 1997 and a mesenteric lesion in 1998 4.  Low testosterone  May 2025-on testosterone  replacement       Disposition: Mr [name] is in clinical remission from melanoma and the appendix carcinoma.  Repeat imaging studies [facility] revealed improvement in the abdominal lymph nodes and nodules.  He is followed by Dr. Claryce and is scheduled for repeat imaging in January.  It is possible the mild hypermetabolism associated with abdominal lymph nodes and nodules in 2024 was related to a benign process.  He will return for an office visit in 6 months.  He continues on normal surveillance with dermatology.  Arley Hof, MD  04/26/2024  9:11 AM

## 2024-05-06 ENCOUNTER — Ambulatory Visit (INDEPENDENT_AMBULATORY_CARE_PROVIDER_SITE_OTHER)

## 2024-05-06 DIAGNOSIS — E291 Testicular hypofunction: Secondary | ICD-10-CM

## 2024-05-11 NOTE — Progress Notes (Signed)
 Pt here for biweekly testosterone injection per Dr Milinda Cave   Injection tolerated well.   Next injection scheduled for 2 weeks

## 2024-05-20 ENCOUNTER — Ambulatory Visit

## 2024-05-20 ENCOUNTER — Telehealth: Payer: Self-pay

## 2024-05-20 DIAGNOSIS — E291 Testicular hypofunction: Secondary | ICD-10-CM

## 2024-05-20 NOTE — Telephone Encounter (Signed)
 Yes, it is fine for him to give the testosterone  injections at home.

## 2024-05-20 NOTE — Telephone Encounter (Signed)
 FYI Pt states he would like to discuss doing shots at home. Will discuss at appt with PCP.

## 2024-05-20 NOTE — Progress Notes (Signed)
 Pt here for biweekly testosterone injection per Dr Milinda Cave   Injection tolerated well.   Next injection scheduled for 2 weeks

## 2024-05-23 ENCOUNTER — Ambulatory Visit: Admitting: Family Medicine

## 2024-05-23 ENCOUNTER — Encounter: Payer: Self-pay | Admitting: Family Medicine

## 2024-05-23 ENCOUNTER — Ambulatory Visit: Payer: Self-pay | Admitting: Family Medicine

## 2024-05-23 VITALS — BP 128/81 | HR 61 | Temp 98.5°F | Ht 69.0 in | Wt 205.4 lb

## 2024-05-23 DIAGNOSIS — E7800 Pure hypercholesterolemia, unspecified: Secondary | ICD-10-CM | POA: Diagnosis not present

## 2024-05-23 DIAGNOSIS — R7303 Prediabetes: Secondary | ICD-10-CM | POA: Diagnosis not present

## 2024-05-23 DIAGNOSIS — E291 Testicular hypofunction: Secondary | ICD-10-CM | POA: Diagnosis not present

## 2024-05-23 DIAGNOSIS — E559 Vitamin D deficiency, unspecified: Secondary | ICD-10-CM

## 2024-05-23 LAB — LIPID PANEL
Cholesterol: 183 mg/dL (ref 0–200)
HDL: 47.8 mg/dL (ref >39.00)
LDL Cholesterol: 121 mg/dL — ABNORMAL HIGH (ref 0–99)
NonHDL: 135.13
Total CHOL/HDL Ratio: 4
Triglycerides: 72 mg/dL (ref 0.0–149.0)
VLDL: 14.4 mg/dL (ref 0.0–40.0)

## 2024-05-23 LAB — CBC
HCT: 50.5 % (ref 39.0–52.0)
Hemoglobin: 16.7 g/dL (ref 13.0–17.0)
MCHC: 33.1 g/dL (ref 30.0–36.0)
MCV: 86.7 fl (ref 78.0–100.0)
Platelets: 215 K/uL (ref 150.0–400.0)
RBC: 5.83 Mil/uL — ABNORMAL HIGH (ref 4.22–5.81)
RDW: 14 % (ref 11.5–15.5)
WBC: 6 K/uL (ref 4.0–10.5)

## 2024-05-23 LAB — BASIC METABOLIC PANEL WITH GFR
BUN: 16 mg/dL (ref 6–23)
CO2: 30 meq/L (ref 19–32)
Calcium: 8.8 mg/dL (ref 8.4–10.5)
Chloride: 101 meq/L (ref 96–112)
Creatinine, Ser: 0.97 mg/dL (ref 0.40–1.50)
GFR: 83.54 mL/min (ref >60.00)
Glucose, Bld: 105 mg/dL — ABNORMAL HIGH (ref 70–99)
Potassium: 4.6 meq/L (ref 3.5–5.1)
Sodium: 139 meq/L (ref 135–145)

## 2024-05-23 LAB — TESTOSTERONE: Testosterone: 1161.08 ng/dL — ABNORMAL HIGH (ref 300.00–890.00)

## 2024-05-23 LAB — POCT GLYCOSYLATED HEMOGLOBIN (HGB A1C)
HbA1c POC (<> result, manual entry): 6.1 % (ref 4.0–5.6)
HbA1c, POC (controlled diabetic range): 6.1 % (ref 0.0–7.0)
HbA1c, POC (prediabetic range): 6.1 % (ref 5.7–6.4)
Hemoglobin A1C: 6.1 % — AB (ref 4.0–5.6)

## 2024-05-23 LAB — VITAMIN D 25 HYDROXY (VIT D DEFICIENCY, FRACTURES): VITD: 34.96 ng/mL (ref 30.00–100.00)

## 2024-05-23 MED ORDER — ATORVASTATIN CALCIUM 20 MG PO TABS: 20.0000 mg | ORAL_TABLET | Freq: Every day | ORAL | 2 refills | Status: DC

## 2024-05-23 NOTE — Progress Notes (Signed)
 OFFICE VISIT  05/23/2024  CC:  Chief Complaint  Patient presents with   Medical Management of Chronic Issues    Patient is a 63 y.o. male who presents for 66-month follow-up hypercholesterolemia, prediabetes, vitamin D  deficiency, and hypogonadism. A/P as of last visit: #1 hypercholesterolemia.  His 10-year Framingham cardiovascular risk has been borderline intermediate and he has not want to be on any medication in the past. He is expressing more interested in trying statin and we will have to just see what his LDL is today.   #2 prediabetes.  He is working hard on diet and exercise. Fasting glucose and hemoglobin A1c today.   3.  Decreased libido.  He requests testosterone  level checked today.   4.  Vitamin D  deficiency.  He is currently taking 2000 units vitamin D  daily. Check vitamin D  level today.  INTERIM HX: Started testosterone  injections about 5 months ago. Recheck of level after getting on injections was mid normal range. He feels very well on the testosterone -> excellent energy level and libido.  However about 10 days after his injection he feels a crash.  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  Has plans for repeat imaging January 2026.   ROS --> no fevers, no CP, no SOB, no wheezing, no cough, no dizziness, no HAs, no rashes, no melena/hematochezia.  No polyuria or polydipsia.  No myalgias or arthralgias.  No focal weakness, paresthesias, or tremors.  No acute vision or hearing abnormalities.  No dysuria or unusual/new urinary urgency or frequency.  No recent changes in lower legs. No n/v/d or abd pain.  No palpitations.    Past Medical History:  Diagnosis Date   Arthritis    lower back   Asymmetrical sensorineural hearing loss    L>>R: ENT eval 04/2018.  Pt declined MRI brain at that time.   Cancer of appendix (HCC)    Colon adenocarcinoma (HCC) 1995   goblet cell adenocarcinoma of colon, dx'd  when he got acute appendicitis w/perf-->path showed adenocarcinoma with positive margins.  CT chest neg for metastatic dz, CEA neg. R hemicolectomy with iliocolonic anastomosis 11/2020.   Coronary artery calcification    and aortic atherosclerosis.  Noted on 09/2021 CT that was done for cancer surveillance   Hepatic steatosis    noted on CT 09/2021 for cancer surveillance   History of adenomatous polyp of colon 07/2016   History of vitamin D  deficiency    still present as of 09/2017.  High dose vit D started 10/16/17.  Increased dose to 50K twice per week 02/18/18.   Hyperlipidemia    Mild, never meds.  Patient prefers no meds for this.   Metastatic malignant melanoma (HCC) 1989   Recurrence L upper arm 1993.  Then left back and LUL lung recurrence 1995-resected.  Brain lesion recurrence 1996-resected.  RLL lung lesion 1996--chemo & then resection.  Small bowel recurrence resected 1997, then mesenteric lesion resected 1998.  No sign of recurrence since 1998.  Released by onc/Dr. Magrinot as of 12/2015 (needs annual dermatologist exam and annual ophth exam).   Polyposis of colon    Prediabetes    A1c 5.9% 12/2014.  6.2% 09/2017. 6.3% 04/2019.   Secondary male hypogonadism    11/2023    Past Surgical History:  Procedure Laterality Date   APPENDECTOMY     BIOPSY  03/12/2023   Procedure: BIOPSY;  Surgeon: Wilhelmenia Aloha Raddle., MD;  Location: [facility];  Service: Gastroenterology;;   BRAIN  SURGERY     Removal of melanoma met   COLON SURGERY  11/2020   Proximal hemicolectomy and lysis of adhesions and repair of incarcerated incisional hernia.  For colon ca resection.  iliocolonic anastomosis.   COLONOSCOPY  2009; 07/2016   11/27/20->adenoma   COLONOSCOPY WITH PROPOFOL  N/A 08/18/2016   Tubular adenoma.  Recall 3 yrs.  Procedure: COLONOSCOPY WITH PROPOFOL ;  Surgeon: Gladis MARLA Louder, MD;  Location: [facility];  Service: Endoscopy;  Laterality: N/A;   ESOPHAGOGASTRODUODENOSCOPY N/A 03/12/2023    Procedure: ESOPHAGOGASTRODUODENOSCOPY (EGD);  Surgeon: Wilhelmenia Aloha Raddle., MD;  Location: [facility];  Service: Gastroenterology;  Laterality: N/A;   EUS N/A 03/12/2023   Procedure: UPPER ENDOSCOPIC ULTRASOUND (EUS) RADIAL;  Surgeon: Wilhelmenia Aloha Raddle., MD;  Location: [facility];  Service: Gastroenterology;  Laterality: N/A;   IR SINUS/FIST TUBE CHK-NON GI  10/08/2020   LAPAROSCOPIC APPENDECTOMY N/A 09/28/2020   Procedure: APPENDECTOMY LAPAROSCOPIC;  Surgeon: Vanderbilt Ned, MD;  Location: [facility];  Service: General;  Laterality: N/A;   LUNG SURGERY                                        SMALL INTESTINE SURGERY  1998   x2 from melanoma    Outpatient Medications Prior to Visit  Medication Sig Dispense Refill   Cholecalciferol  (VITAMIN D3) 50 MCG (2000 UT) TABS Take 2,000 Units by mouth in the morning.     Omega-3 Fatty Acids (FISH OIL) 1000 MG CAPS Take by mouth. Taking 1200 mg     testosterone  cypionate (DEPOTESTOSTERONE CYPIONATE) 200 MG/ML injection INJECT 1 ML INTO THE MUSCLE EVERY 2 WKS 6 mL 3   Facility-Administered Medications Prior to Visit  Medication Dose Route Frequency Provider Last Rate Last Admin   testosterone  cypionate (DEPOTESTOSTERONE CYPIONATE) injection 200 mg  200 mg Intramuscular Q14 Days Mailynn Everly, Aleene DEL, MD   200 mg at 05/20/24 9086    Allergies  Allergen Reactions   Aspirin Nausea And Vomiting   Penicillins Other (See Comments)    Childhood  Has patient had a PCN reaction causing immediate rash, facial/tongue/throat swelling, SOB or lightheadedness with hypotension: unknown Has patient had a PCN reaction causing severe rash involving mucus membranes or skin necrosis: {unkjnown Has patient had a PCN reaction that required hospitalization {no Has patient had a PCN reaction occurring within the last 10 years: no If all of the above answers are NO, then may proceed with Cephalosporin use.    Review of Systems As per HPI  PE:     05/23/2024    8:44 AM 04/26/2024    9:04 AM 02/24/2024   10:40 AM  Vitals with BMI  Height 5' 9 5' 9   Weight 205 lbs 6 oz 203 lbs 14 oz   BMI 30.32 30.1   Systolic 128 120 877  Diastolic 81 74 76  Pulse 61 75      Physical Exam  Gen: Alert, well appearing.  Patient is oriented to person, place, time, and situation. AFFECT: pleasant, lucid thought and speech.  He does have a bit of a facial rubor. CV: RRR, no m/r/g.   LUNGS: CTA bilat, nonlabored resps, good aeration in all lung fields. EXT: no clubbing or cyanosis.  no edema.    LABS:  Last CBC Lab Results  Component Value Date   WBC 5.1 11/20/2023   HGB 14.9 11/20/2023   HCT 44.4 11/20/2023  MCV 88.6 11/20/2023   MCH 30.4 10/01/2021   RDW 13.2 11/20/2023   PLT 245.0 11/20/2023   Last metabolic panel Lab Results  Component Value Date   GLUCOSE 106 (H) 11/20/2023   NA 138 11/20/2023   K 4.2 11/20/2023   CL 103 11/20/2023   CO2 29 11/20/2023   BUN 17 11/20/2023   CREATININE 0.87 11/20/2023   GFR 92.67 11/20/2023   CALCIUM  8.8 11/20/2023   PHOS 4.2 12/01/2020   PROT 7.1 11/20/2023   ALBUMIN 4.4 11/20/2023   BILITOT 0.6 11/20/2023   ALKPHOS 43 11/20/2023   AST 17 11/20/2023   ALT 25 11/20/2023   ANIONGAP 7 10/01/2021   Last lipids Lab Results  Component Value Date   CHOL 200 11/20/2023   HDL 43.70 11/20/2023   LDLCALC 137 (H) 11/20/2023   LDLDIRECT 121.0 10/03/2021   TRIG 97.0 11/20/2023   CHOLHDL 5 11/20/2023   Last hemoglobin A1c Lab Results  Component Value Date   HGBA1C 6.3 11/20/2023   Last thyroid  functions Lab Results  Component Value Date   TSH 3.00 10/09/2022   Last vitamin D  Lab Results  Component Value Date   VD25OH 30.89 11/20/2023   Lab Results  Component Value Date   TESTOSTERONE  496.37 12/25/2023   IMPRESSION AND PLAN:  #1 prediabetes. POC Hba1c today is 6.1%. Encouraged continued efforts at healthy diet and exercise.  #2 secondary male hypogonadism.  He is doing  great on testosterone . He wants to go up on the dose if possible or continue same dose mg total but split into weekly 100 mg doses. Check testosterone  level and CBC today.  3.  Hypercholesterolemia. He is open to starting a statin now--> will monitor lipid panel again today. Start atorvastatin 20 mg a day.  4.  Vitamin D  deficiency. Taking 2000 unit vitamin D  daily. Check vitamin D  level today.  An After Visit Summary was printed and given to the patient.  FOLLOW UP: Return in about 6 months (around 11/20/2024) for annual CPE (fasting). Next CPE 11/2024 Signed:  Gerlene Hockey, MD           05/23/2024

## 2024-05-24 NOTE — Telephone Encounter (Signed)
 I don't think a new testost rx is needed right now.

## 2024-06-03 ENCOUNTER — Ambulatory Visit

## 2024-06-03 DIAGNOSIS — E291 Testicular hypofunction: Secondary | ICD-10-CM | POA: Diagnosis not present

## 2024-06-03 NOTE — Progress Notes (Signed)
 Pt here for weekly testosterone  injection per Dr Candise.   Injection tolerated well.   Next injection scheduled for 1 week

## 2024-06-06 MED ORDER — SYRINGE 18G X 1-1/2" 3 ML MISC: 0.5000 mL | 51 refills | Status: AC

## 2024-06-06 MED ORDER — BD DISP NEEDLES 22G X 1-1/2" MISC: 0.5000 mL | 51 refills | Status: DC

## 2024-06-06 NOTE — Addendum Note (Signed)
 Addended by: CLAUDENE SHANDA ORN on: 06/06/2024 10:04 AM   Modules accepted: Orders

## 2024-06-08 ENCOUNTER — Other Ambulatory Visit: Payer: Self-pay | Admitting: Family Medicine

## 2024-06-10 ENCOUNTER — Telehealth: Payer: Self-pay

## 2024-06-10 ENCOUNTER — Ambulatory Visit (INDEPENDENT_AMBULATORY_CARE_PROVIDER_SITE_OTHER)

## 2024-06-10 ENCOUNTER — Other Ambulatory Visit: Payer: Self-pay | Admitting: Family Medicine

## 2024-06-10 ENCOUNTER — Encounter: Payer: Self-pay | Admitting: Family Medicine

## 2024-06-10 DIAGNOSIS — E291 Testicular hypofunction: Secondary | ICD-10-CM | POA: Diagnosis not present

## 2024-06-10 MED ORDER — TESTOSTERONE CYPIONATE 200 MG/ML IM SOLN
100.0000 mg | Freq: Once | INTRAMUSCULAR | Status: AC
  Administered 2024-06-10: 100 mg via INTRAMUSCULAR

## 2024-06-10 NOTE — Progress Notes (Signed)
 Pt here for weekly testosterone  injection per Dr Candise.   Injection tolerated well.   Next injection scheduled for 1 week

## 2024-06-10 NOTE — Telephone Encounter (Signed)
 Pt came in office for his weekly testosterone  injection and asked for message to be relayed to provider. Pt stated that after starting atorvastatin  (LIPITOR) 20 MG tablet, he started to experience a number of side effects, including vivid dreams and fatigue. Due to this, pt has stopped the medication. Sending to PCP for FYI.

## 2024-06-10 NOTE — Telephone Encounter (Signed)
Noted. EMR updated. 

## 2024-06-15 ENCOUNTER — Ambulatory Visit (INDEPENDENT_AMBULATORY_CARE_PROVIDER_SITE_OTHER)

## 2024-06-15 DIAGNOSIS — E291 Testicular hypofunction: Secondary | ICD-10-CM | POA: Diagnosis not present

## 2024-06-15 MED ORDER — TESTOSTERONE CYPIONATE 200 MG/ML IM SOLN
100.0000 mg | INTRAMUSCULAR | Status: AC
  Administered 2024-06-15 – 2024-07-01 (×2): 100 mg via INTRAMUSCULAR

## 2024-06-15 NOTE — Progress Notes (Signed)
 Pt here for weekly testosterone  injection per Dr McGowen  Injection tolerated well.  Next injection scheduled for next week

## 2024-06-24 ENCOUNTER — Ambulatory Visit

## 2024-06-24 DIAGNOSIS — E291 Testicular hypofunction: Secondary | ICD-10-CM

## 2024-06-24 MED ORDER — TESTOSTERONE CYPIONATE 200 MG/ML IM SOLN
200.0000 mg | Freq: Once | INTRAMUSCULAR | Status: AC
  Administered 2024-06-24: 200 mg via INTRAMUSCULAR

## 2024-06-24 NOTE — Progress Notes (Signed)
 Pt here for weekly testosterone  injection per Dr McGowen  Injection tolerated well.  Next injection scheduled for next week

## 2024-06-27 ENCOUNTER — Telehealth: Payer: Self-pay

## 2024-06-27 ENCOUNTER — Ambulatory Visit (HOSPITAL_BASED_OUTPATIENT_CLINIC_OR_DEPARTMENT_OTHER)
Admission: RE | Admit: 2024-06-27 | Discharge: 2024-06-27 | Disposition: A | Source: Ambulatory Visit | Attending: Family Medicine | Admitting: Family Medicine

## 2024-06-27 ENCOUNTER — Ambulatory Visit (INDEPENDENT_AMBULATORY_CARE_PROVIDER_SITE_OTHER): Admitting: Family Medicine

## 2024-06-27 ENCOUNTER — Encounter: Payer: Self-pay | Admitting: Family Medicine

## 2024-06-27 ENCOUNTER — Encounter (HOSPITAL_BASED_OUTPATIENT_CLINIC_OR_DEPARTMENT_OTHER): Payer: Self-pay

## 2024-06-27 VITALS — BP 134/67 | HR 55 | Temp 98.0°F | Ht 69.0 in | Wt 211.2 lb

## 2024-06-27 DIAGNOSIS — M436 Torticollis: Secondary | ICD-10-CM | POA: Diagnosis not present

## 2024-06-27 DIAGNOSIS — Z789 Other specified health status: Secondary | ICD-10-CM

## 2024-06-27 DIAGNOSIS — E7800 Pure hypercholesterolemia, unspecified: Secondary | ICD-10-CM

## 2024-06-27 NOTE — Telephone Encounter (Signed)
 CRM # [id number] Owner: Katrinka Robinson HERO Status: Resolved Open  Priority: Routine Created on: 06/27/2024 10:11 AM By: Carman Aleatha CROME   Primary Information  Source  Sean [name] (Patient)   Subject  Sean [name] (Patient)   Topic  General - Other    Communication  Reason for CRM: Dr order a lumber.spine and patient says its for his neck, Clotilda from [facility] in [place], [phone]     Patient Information  Patient Name Gender DOB SSN  Sean [name], Sean [name] Male [id number] [id number]   Notes  Claudene Shanda ORN, CMA   06/27/2024 10:53 AM  Spoke with Clotilda. New order placed and last one cancelled    Costello, Mesmerise H   06/27/2024 10:46 AM  Harlene from [facility] stated wrong order was sent over it's supposed to his neck and showing lumbar and spine patient is there and upset spoke to [facility] stated spoke to nurse and they message Dr. Candise waiting for him to respond but nothing they can do further until they hear back      Katrinka Robinson HERO   06/27/2024 10:43 AM  Patient is calling from imaging stating order was sent incorrectly and he's at imaging center waiting for order to be corrected, can reach out to [facility] from [facility] in [place], [phone]. Patient states he has to leave for work soon and can't stay. Advised patient office waiting on Dr. McGowen

## 2024-06-27 NOTE — Progress Notes (Signed)
 OFFICE VISIT  06/27/2024  CC:  Chief Complaint  Patient presents with   Discuss Imaging    Pt had spine x-ray completed thru chiropractor    Patient is a 63 y.o. male who presents for concern of incidental abnormality noted on cervical spine x-ray at the chiropractor.  HPI: Sean [name] has stiffness in his neck from time to time.   He has gone to a chiropractor in the past for general manipulations b/c he usually feels better after. This time he denies feeling any pain or new stiffness. He went to the chiropractor last week.  An x-ray was taken of his neck and the chiropractor was concerned about an abnormality but he could not state the specific abnormality to [place].  Seems to be the anterior neck region, though, per Randy's report today. Sean [name] has a picture of the x-ray on his phone but we were unable to get the chiropractor to send over any information.  Currently Sean [name] denies any neck pain.  He does not feel any abnormality in the anterior or lateral soft tissues region.   Also, I rx'd him atorvastatin  1 mo ago for elev LDL.  He had multiple side effects and stopped taking it (cognitive clouding, body aches).  Past Medical History:  Diagnosis Date   Arthritis    lower back   Asymmetrical sensorineural hearing loss    L>>R: ENT eval 04/2018.  Pt declined MRI brain at that time.   Cancer of appendix (HCC)    Colon adenocarcinoma (HCC) 1995   goblet cell adenocarcinoma of colon, dx'd when he got acute appendicitis w/perf-->path showed adenocarcinoma with positive margins.  CT chest neg for metastatic dz, CEA neg. R hemicolectomy with iliocolonic anastomosis 11/2020.   Coronary artery calcification    and aortic atherosclerosis.  Noted on 09/2021 CT that was done for cancer surveillance   Hepatic steatosis    noted on CT 09/2021 for cancer surveillance   History of adenomatous polyp of colon 07/2016   History of vitamin D  deficiency    still present as of 09/2017.  High dose vit D started  10/16/17.  Increased dose to 50K twice per week 02/18/18.   Hyperlipidemia    Mild, never meds.  Patient prefers no meds for this.   Metastatic malignant melanoma (HCC) 1989   Recurrence L upper arm 1993.  Then left back and LUL lung recurrence 1995-resected.  Brain lesion recurrence 1996-resected.  RLL lung lesion 1996--chemo & then resection.  Small bowel recurrence resected 1997, then mesenteric lesion resected 1998.  No sign of recurrence since 1998.  Released by onc/Dr. Magrinot as of 12/2015 (needs annual dermatologist exam and annual ophth exam).   Polyposis of colon    Prediabetes    A1c 5.9% 12/2014.  6.2% 09/2017. 6.3% 04/2019.   Secondary male hypogonadism    11/2023    Past Surgical History:  Procedure Laterality Date   APPENDECTOMY     BIOPSY  03/12/2023   Procedure: BIOPSY;  Surgeon: Wilhelmenia Aloha Raddle., MD;  Location: [facility];  Service: Gastroenterology;;   BRAIN SURGERY     Removal of melanoma met   COLON SURGERY  11/2020   Proximal hemicolectomy and lysis of adhesions and repair of incarcerated incisional hernia.  For colon ca resection.  iliocolonic anastomosis.   COLONOSCOPY  2009; 07/2016   11/27/20->adenoma   COLONOSCOPY WITH PROPOFOL  N/A 08/18/2016   Tubular adenoma.  Recall 3 yrs.  Procedure: COLONOSCOPY WITH PROPOFOL ;  Surgeon: Gladis MARLA Louder, MD;  Location:  [facility];  Service: Endoscopy;  Laterality: N/A;   ESOPHAGOGASTRODUODENOSCOPY N/A 03/12/2023   Procedure: ESOPHAGOGASTRODUODENOSCOPY (EGD);  Surgeon: Wilhelmenia Aloha Raddle., MD;  Location: [facility];  Service: Gastroenterology;  Laterality: N/A;   EUS N/A 03/12/2023   Procedure: UPPER ENDOSCOPIC ULTRASOUND (EUS) RADIAL;  Surgeon: Wilhelmenia Aloha Raddle., MD;  Location: [facility];  Service: Gastroenterology;  Laterality: N/A;   IR SINUS/FIST TUBE CHK-NON GI  10/08/2020   LAPAROSCOPIC APPENDECTOMY N/A 09/28/2020   Procedure: APPENDECTOMY LAPAROSCOPIC;  Surgeon: Vanderbilt Ned, MD;  Location: [facility];   Service: General;  Laterality: N/A;   LUNG SURGERY                                        SMALL INTESTINE SURGERY  1998   x2 from melanoma    Outpatient Medications Prior to Visit  Medication Sig Dispense Refill   BD DISP NEEDLES 22G X 1-1/2 MISC INJECT 0.5 MLS INTO THE MUSCLE ONCE A WEEK. 1 each 51   Cholecalciferol  (VITAMIN D3) 50 MCG (2000 UT) TABS Take 2,000 Units by mouth in the morning.     Omega-3 Fatty Acids (FISH OIL) 1000 MG CAPS Take by mouth. Taking 1200 mg     Syringe/Needle, Disp, (SYRINGE 3CC/18GX1-1/2) 18G X 1-1/2 3 ML MISC Inject 0.5 mLs into the muscle once a week. 1 each 51   testosterone  cypionate (DEPOTESTOSTERONE CYPIONATE) 200 MG/ML injection INJECT 1 ML INTO THE MUSCLE EVERY 2 WKS 6 mL 3   Facility-Administered Medications Prior to Visit  Medication Dose Route Frequency Provider Last Rate Last Admin   testosterone  cypionate (DEPOTESTOSTERONE CYPIONATE) injection 100 mg  100 mg Intramuscular Weekly Amir Fick, Aleene DEL, MD   100 mg at 06/15/24 9082    Allergies  Allergen Reactions   Aspirin Nausea And Vomiting   Atorvastatin  Other (See Comments)    Vivid dreams and fatigue   Penicillins Other (See Comments)    Childhood  Has patient had a PCN reaction causing immediate rash, facial/tongue/throat swelling, SOB or lightheadedness with hypotension: unknown Has patient had a PCN reaction causing severe rash involving mucus membranes or skin necrosis: {unkjnown Has patient had a PCN reaction that required hospitalization {no Has patient had a PCN reaction occurring within the last 10 years: no If all of the above answers are NO, then may proceed with Cephalosporin use.    Review of Systems  As per HPI  PE:    06/27/2024    9:02 AM 05/23/2024    8:44 AM 04/26/2024    9:04 AM  Vitals with BMI  Height 5' 9 5' 9 5' 9  Weight 211 lbs 3 oz 205 lbs 6 oz 203 lbs 14 oz  BMI 31.17 30.32 30.1  Systolic 134 128 879  Diastolic 67 81 74  Pulse 55 61 75      Physical Exam  Gen: Alert, well appearing.  Patient is oriented to person, place, time, and situation. AFFECT: pleasant, lucid thought and speech. Neck: soft, nontender, no mass .  ROM fully intact.  LABS:  Last metabolic panel Lab Results  Component Value Date   GLUCOSE 105 (H) 05/23/2024   NA 139 05/23/2024   K 4.6 05/23/2024   CL 101 05/23/2024   CO2 30 05/23/2024   BUN 16 05/23/2024   CREATININE 0.97 05/23/2024   GFR 83.54 05/23/2024   CALCIUM  8.8 05/23/2024   PHOS 4.2 12/01/2020  PROT 7.1 11/20/2023   ALBUMIN 4.4 11/20/2023   BILITOT 0.6 11/20/2023   ALKPHOS 43 11/20/2023   AST 17 11/20/2023   ALT 25 11/20/2023   ANIONGAP 7 10/01/2021   Lab Results  Component Value Date   CHOL 183 05/23/2024   HDL 47.80 05/23/2024   LDLCALC 121 (H) 05/23/2024   LDLDIRECT 121.0 10/03/2021   TRIG 72.0 05/23/2024   CHOLHDL 4 05/23/2024   IMPRESSION AND PLAN:  1) C-spine radiograph abnormality. He is asymptomatic. Exam is normal. I could not see any abnormality on the x-ray that he showed me on his phone today. Will get C-spine radiograph to see if our radiologist can see anything abnormal.  2) HLD, intol atorva. Will cont with only diet/exercise and discuss another trial of different statin in future.  An After Visit Summary was printed and given to the patient.  FOLLOW UP: No follow-ups on file.  Signed:  Gerlene Hockey, MD           06/27/2024

## 2024-06-27 NOTE — Addendum Note (Signed)
 Addended by: CANDISE ALEENE DEL on: 06/27/2024 10:46 AM   Modules accepted: Orders

## 2024-06-27 NOTE — Addendum Note (Signed)
 Addended by: CLAUDENE SHANDA ORN on: 06/27/2024 10:50 AM   Modules accepted: Orders

## 2024-06-28 NOTE — Telephone Encounter (Signed)
 Copied from CRM [id number]. Topic: Clinical - Lab/Test Results >> Jun 28, 2024 12:39 PM Drema MATSU wrote: Reason for CRM: Patient called to check on imaging results. Advised he will receive a call when results are in and doctor has reviewed them.

## 2024-06-28 NOTE — Telephone Encounter (Signed)
 PCP has not received results yet to give information

## 2024-07-01 ENCOUNTER — Ambulatory Visit: Payer: Self-pay | Admitting: Family Medicine

## 2024-07-01 ENCOUNTER — Ambulatory Visit

## 2024-07-01 DIAGNOSIS — E291 Testicular hypofunction: Secondary | ICD-10-CM

## 2024-07-01 NOTE — Progress Notes (Signed)
 Pt here for biweekly testosterone  injection per Dr McGowen  Injection tolerated well.  Next injection scheduled for next week

## 2024-10-25 ENCOUNTER — Ambulatory Visit: Admitting: Oncology

## 2024-11-21 ENCOUNTER — Encounter: Admitting: Family Medicine

## 2025-02-27 ENCOUNTER — Ambulatory Visit: Admitting: Dermatology
